# Patient Record
Sex: Female | Born: 1945 | Race: White | Hispanic: No | Smoking: Never smoker
Health system: Southern US, Community
[De-identification: ages and names within clinical notes are randomized; demographics above are authoritative.]

## PROBLEM LIST (undated history)

## (undated) DIAGNOSIS — I2510 Atherosclerotic heart disease of native coronary artery without angina pectoris: Secondary | ICD-10-CM

## (undated) DIAGNOSIS — Z923 Personal history of irradiation: Secondary | ICD-10-CM

## (undated) DIAGNOSIS — IMO0001 Reserved for inherently not codable concepts without codable children: Secondary | ICD-10-CM

## (undated) DIAGNOSIS — D649 Anemia, unspecified: Secondary | ICD-10-CM

## (undated) DIAGNOSIS — Z8719 Personal history of other diseases of the digestive system: Secondary | ICD-10-CM

## (undated) DIAGNOSIS — K589 Irritable bowel syndrome without diarrhea: Secondary | ICD-10-CM

## (undated) DIAGNOSIS — R011 Cardiac murmur, unspecified: Secondary | ICD-10-CM

## (undated) DIAGNOSIS — C4490 Unspecified malignant neoplasm of skin, unspecified: Secondary | ICD-10-CM

## (undated) DIAGNOSIS — I499 Cardiac arrhythmia, unspecified: Secondary | ICD-10-CM

## (undated) DIAGNOSIS — R002 Palpitations: Secondary | ICD-10-CM

## (undated) DIAGNOSIS — K449 Diaphragmatic hernia without obstruction or gangrene: Secondary | ICD-10-CM

## (undated) DIAGNOSIS — M797 Fibromyalgia: Secondary | ICD-10-CM

## (undated) DIAGNOSIS — M1990 Unspecified osteoarthritis, unspecified site: Secondary | ICD-10-CM

## (undated) DIAGNOSIS — C801 Malignant (primary) neoplasm, unspecified: Secondary | ICD-10-CM

## (undated) DIAGNOSIS — I10 Essential (primary) hypertension: Secondary | ICD-10-CM

## (undated) DIAGNOSIS — Z9889 Other specified postprocedural states: Secondary | ICD-10-CM

## (undated) DIAGNOSIS — T8859XA Other complications of anesthesia, initial encounter: Secondary | ICD-10-CM

## (undated) DIAGNOSIS — R112 Nausea with vomiting, unspecified: Secondary | ICD-10-CM

## (undated) DIAGNOSIS — E785 Hyperlipidemia, unspecified: Secondary | ICD-10-CM

## (undated) DIAGNOSIS — R0602 Shortness of breath: Secondary | ICD-10-CM

## (undated) DIAGNOSIS — H9190 Unspecified hearing loss, unspecified ear: Secondary | ICD-10-CM

## (undated) DIAGNOSIS — N809 Endometriosis, unspecified: Secondary | ICD-10-CM

## (undated) DIAGNOSIS — I341 Nonrheumatic mitral (valve) prolapse: Secondary | ICD-10-CM

## (undated) DIAGNOSIS — S82892A Other fracture of left lower leg, initial encounter for closed fracture: Secondary | ICD-10-CM

## (undated) DIAGNOSIS — K219 Gastro-esophageal reflux disease without esophagitis: Secondary | ICD-10-CM

## (undated) HISTORY — PX: BREAST LUMPECTOMY: SHX2

## (undated) HISTORY — PX: TOTAL HIP ARTHROPLASTY: SHX124

## (undated) HISTORY — PX: BLEPHAROPLASTY: SUR158

## (undated) HISTORY — PX: APPENDECTOMY: SHX54

## (undated) HISTORY — PX: CARDIAC CATHETERIZATION: SHX172

## (undated) HISTORY — PX: SHOULDER ARTHROSCOPY DISTAL CLAVICLE EXCISION AND OPEN ROTATOR CUFF REPAIR: SHX2396

## (undated) HISTORY — PX: COLONOSCOPY W/ POLYPECTOMY: SHX1380

## (undated) HISTORY — PX: CHOLECYSTECTOMY: SHX55

## (undated) HISTORY — PX: CATARACT EXTRACTION, BILATERAL: SHX1313

## (undated) HISTORY — PX: SEPTOPLASTY: SUR1290

## (undated) HISTORY — PX: ABDOMINAL HYSTERECTOMY: SHX81

## (undated) HISTORY — PX: BACK SURGERY: SHX140

## (undated) HISTORY — PX: FOOT SURGERY: SHX648

## (undated) HISTORY — PX: LAPAROSCOPY: SHX197

## (undated) HISTORY — PX: KYPHOPLASTY: SHX5884

## (undated) HISTORY — PX: COLONOSCOPY WITH ESOPHAGOGASTRODUODENOSCOPY (EGD): SHX5779

---

## 2013-01-03 HISTORY — PX: LEFT HEART CATH AND CORONARY ANGIOGRAPHY: CATH118249

## 2013-12-05 DIAGNOSIS — H0019 Chalazion unspecified eye, unspecified eyelid: Secondary | ICD-10-CM | POA: Insufficient documentation

## 2015-01-18 DIAGNOSIS — I2510 Atherosclerotic heart disease of native coronary artery without angina pectoris: Secondary | ICD-10-CM | POA: Insufficient documentation

## 2015-01-18 DIAGNOSIS — I341 Nonrheumatic mitral (valve) prolapse: Secondary | ICD-10-CM | POA: Insufficient documentation

## 2015-01-18 DIAGNOSIS — E785 Hyperlipidemia, unspecified: Secondary | ICD-10-CM | POA: Insufficient documentation

## 2015-01-18 DIAGNOSIS — I10 Essential (primary) hypertension: Secondary | ICD-10-CM | POA: Insufficient documentation

## 2017-01-11 DIAGNOSIS — E559 Vitamin D deficiency, unspecified: Secondary | ICD-10-CM | POA: Insufficient documentation

## 2017-01-11 DIAGNOSIS — M797 Fibromyalgia: Secondary | ICD-10-CM | POA: Insufficient documentation

## 2017-04-14 DIAGNOSIS — G4719 Other hypersomnia: Secondary | ICD-10-CM | POA: Insufficient documentation

## 2017-11-23 DIAGNOSIS — M6751 Plica syndrome, right knee: Secondary | ICD-10-CM | POA: Insufficient documentation

## 2018-01-04 DIAGNOSIS — Z96643 Presence of artificial hip joint, bilateral: Secondary | ICD-10-CM | POA: Insufficient documentation

## 2018-01-19 DIAGNOSIS — Z79899 Other long term (current) drug therapy: Secondary | ICD-10-CM | POA: Insufficient documentation

## 2018-01-19 DIAGNOSIS — F419 Anxiety disorder, unspecified: Secondary | ICD-10-CM | POA: Insufficient documentation

## 2018-08-09 DIAGNOSIS — M5136 Other intervertebral disc degeneration, lumbar region: Secondary | ICD-10-CM | POA: Insufficient documentation

## 2019-02-27 DIAGNOSIS — G4700 Insomnia, unspecified: Secondary | ICD-10-CM | POA: Insufficient documentation

## 2019-09-19 DIAGNOSIS — M25561 Pain in right knee: Secondary | ICD-10-CM | POA: Insufficient documentation

## 2020-03-04 DIAGNOSIS — S83231A Complex tear of medial meniscus, current injury, right knee, initial encounter: Secondary | ICD-10-CM | POA: Insufficient documentation

## 2020-03-13 DIAGNOSIS — M4802 Spinal stenosis, cervical region: Secondary | ICD-10-CM | POA: Insufficient documentation

## 2020-03-13 DIAGNOSIS — M5412 Radiculopathy, cervical region: Secondary | ICD-10-CM | POA: Insufficient documentation

## 2020-04-24 ENCOUNTER — Other Ambulatory Visit: Payer: Self-pay | Admitting: Physical Medicine & Rehabilitation

## 2020-04-24 DIAGNOSIS — M5412 Radiculopathy, cervical region: Secondary | ICD-10-CM

## 2020-04-24 DIAGNOSIS — M542 Cervicalgia: Secondary | ICD-10-CM

## 2020-05-01 ENCOUNTER — Ambulatory Visit
Admission: RE | Admit: 2020-05-01 | Discharge: 2020-05-01 | Disposition: A | Discharge status: Home / Self Care | Payer: Medicare Other | Source: Ambulatory Visit | Attending: Physical Medicine & Rehabilitation | Admitting: Physical Medicine & Rehabilitation

## 2020-05-01 DIAGNOSIS — M5412 Radiculopathy, cervical region: Secondary | ICD-10-CM

## 2020-05-01 DIAGNOSIS — M542 Cervicalgia: Secondary | ICD-10-CM

## 2020-05-01 IMAGING — XA DG INJECT/[PERSON_NAME] INC NEEDLE/CATH/PLC EPI/CERV/THOR W/IMG
2 series · 2 of 2 positions shown · non-contrast
Comparison: none

CLINICAL DATA: Cervical spondylosis without myelopathy. Pain across
the neck extending into the left shoulder with headaches. Prior
C3-C6 fusion.

[Series 1: ortho adipose · 1 of 1 slices shown (1 of 2)]
[im 1/1]
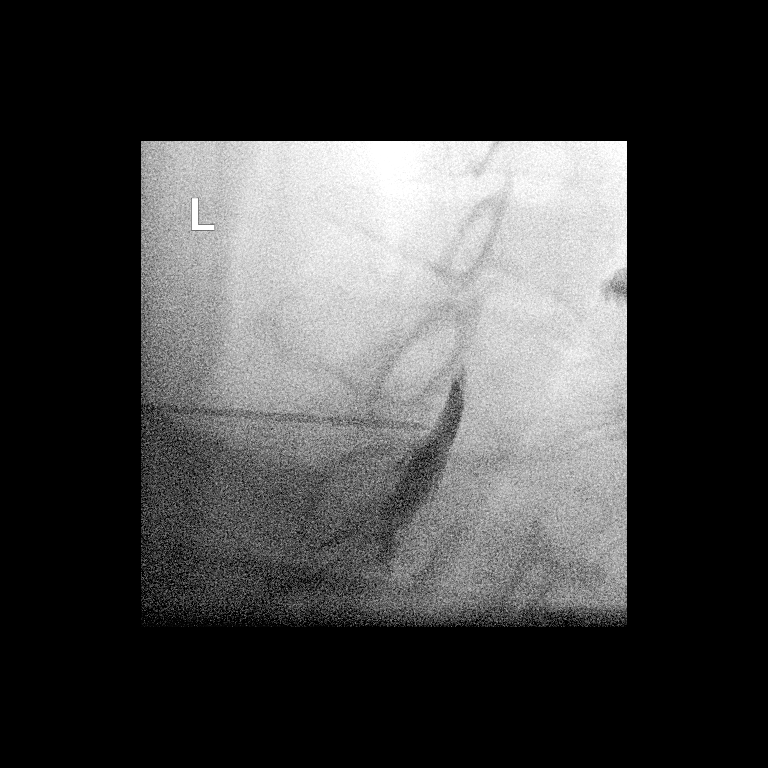

[Series 2: ortho adipose · 1 of 1 slices shown (2 of 2)]
[im 1/1]
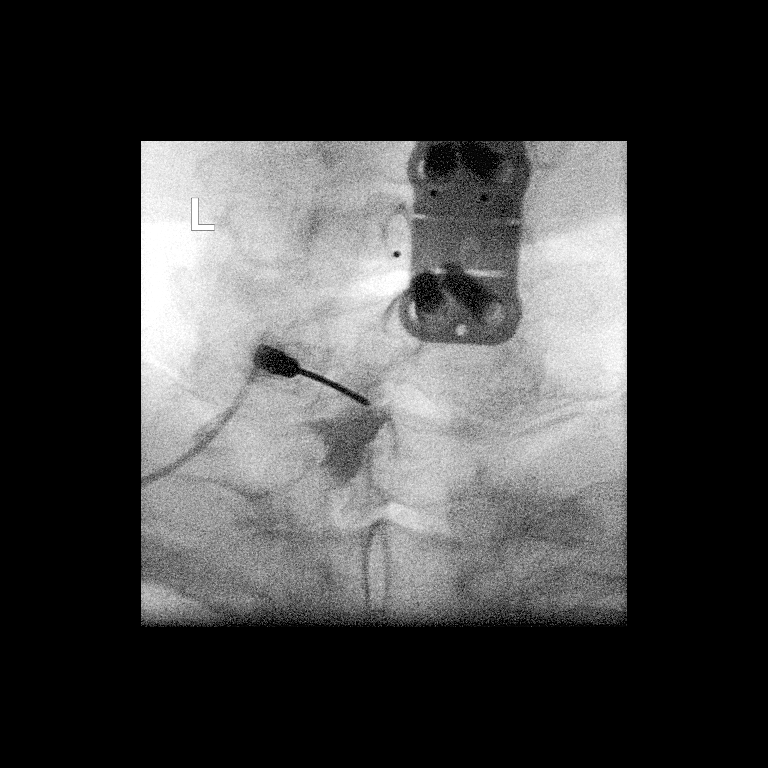

[2 of 2 positions shown; findings below may reference images not displayed]

FLUOROSCOPY TIME:  Fluoroscopy Time: 21 seconds

Radiation Exposure Index: 14.58 microGray*m^2

PROCEDURE:
The procedure, risks, benefits, and alternatives were explained to
the patient. Questions regarding the procedure were encouraged and
answered. The patient understands and consents to the procedure.

CERVICAL EPIDURAL INJECTION

An interlaminar approach was performed on the left at C7-T1. A
inch 20 gauge epidural needle was advanced using loss-of-resistance
technique.

DIAGNOSTIC EPIDURAL INJECTION

Injection of Isovue-M 300 shows a good epidural pattern with spread
above and below the level of needle placement, primarily on the
left. No vascular opacification is seen.

THERAPEUTIC EPIDURAL INJECTION

1.5 ml of Kenalog 40 mixed with 2 ml of normal saline were then
instilled. The procedure was well-tolerated, and the patient was
discharged thirty minutes following the injection in good condition.
IMPRESSION: Technically successful interlaminar epidural injection on the left
at C7-T1.

## 2020-05-01 MED ORDER — TRIAMCINOLONE ACETONIDE 40 MG/ML IJ SUSP (RADIOLOGY)
60.0000 mg | Freq: Once | INTRAMUSCULAR | Status: AC
  Administered 2020-05-01: 60 mg via EPIDURAL

## 2020-05-01 MED ORDER — IOPAMIDOL (ISOVUE-M 300) INJECTION 61%
1.0000 mL | Freq: Once | INTRAMUSCULAR | Status: AC | PRN
  Administered 2020-05-01: 1 mL via EPIDURAL

## 2020-05-01 NOTE — Discharge Instructions (Signed)

## 2020-07-30 ENCOUNTER — Other Ambulatory Visit: Payer: Self-pay | Admitting: Internal Medicine

## 2020-07-30 DIAGNOSIS — Z1231 Encounter for screening mammogram for malignant neoplasm of breast: Secondary | ICD-10-CM

## 2020-08-21 DIAGNOSIS — I272 Pulmonary hypertension, unspecified: Secondary | ICD-10-CM

## 2020-08-21 HISTORY — DX: Pulmonary hypertension, unspecified: I27.20

## 2020-11-05 ENCOUNTER — Emergency Department: Payer: Medicare Other

## 2020-11-05 ENCOUNTER — Other Ambulatory Visit: Payer: Self-pay

## 2020-11-05 ENCOUNTER — Emergency Department
Admission: EM | Admit: 2020-11-05 | Discharge: 2020-11-05 | Disposition: A | Discharge status: Home / Self Care | Payer: Medicare Other | Attending: Emergency Medicine | Admitting: Emergency Medicine

## 2020-11-05 DIAGNOSIS — S82832A Other fracture of upper and lower end of left fibula, initial encounter for closed fracture: Secondary | ICD-10-CM | POA: Insufficient documentation

## 2020-11-05 DIAGNOSIS — I10 Essential (primary) hypertension: Secondary | ICD-10-CM | POA: Diagnosis not present

## 2020-11-05 DIAGNOSIS — I2510 Atherosclerotic heart disease of native coronary artery without angina pectoris: Secondary | ICD-10-CM | POA: Diagnosis not present

## 2020-11-05 DIAGNOSIS — W010XXA Fall on same level from slipping, tripping and stumbling without subsequent striking against object, initial encounter: Secondary | ICD-10-CM | POA: Diagnosis not present

## 2020-11-05 DIAGNOSIS — S82302A Unspecified fracture of lower end of left tibia, initial encounter for closed fracture: Secondary | ICD-10-CM | POA: Diagnosis not present

## 2020-11-05 DIAGNOSIS — M25572 Pain in left ankle and joints of left foot: Secondary | ICD-10-CM | POA: Diagnosis present

## 2020-11-05 DIAGNOSIS — S82392A Other fracture of lower end of left tibia, initial encounter for closed fracture: Secondary | ICD-10-CM

## 2020-11-05 DIAGNOSIS — S8992XA Unspecified injury of left lower leg, initial encounter: Secondary | ICD-10-CM | POA: Diagnosis present

## 2020-11-05 IMAGING — DX DG TIBIA/FIBULA 2V*L*
4 series · 4 of 4 positions shown · non-contrast
Comparison: None.

CLINICAL DATA: Lower leg deformity

EXAM:
LEFT TIBIA AND FIBULA - 2 VIEW

[tibia ap (1 of 2)]
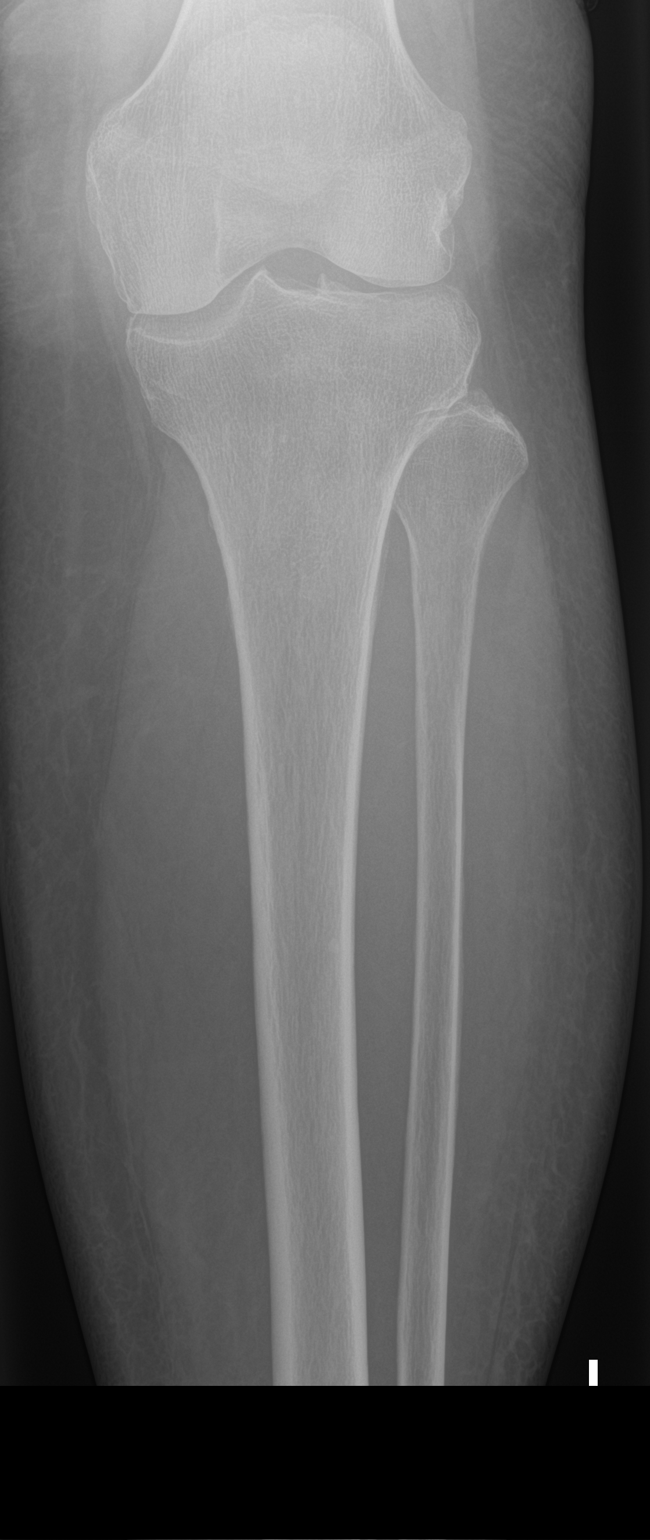

[tibia ap (2 of 2)]
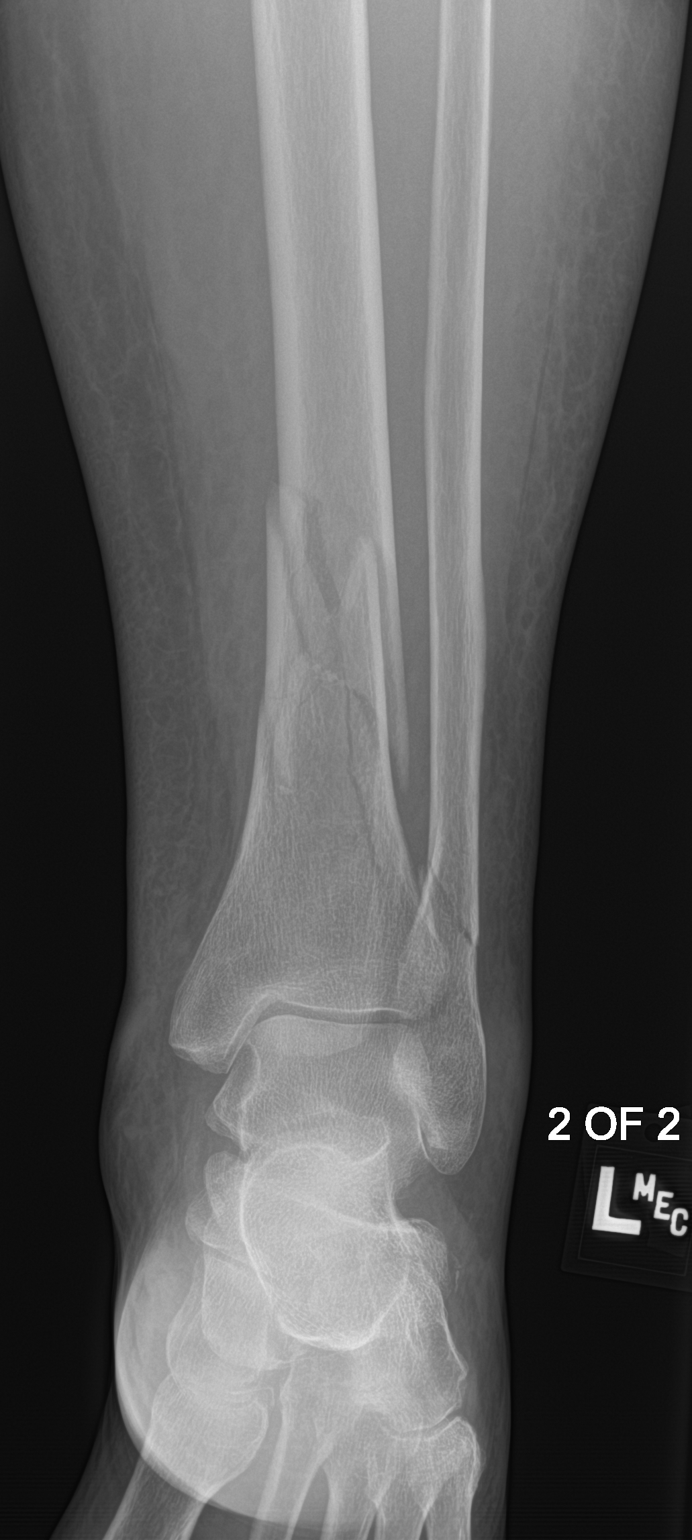

[tibia lat (1 of 2)]
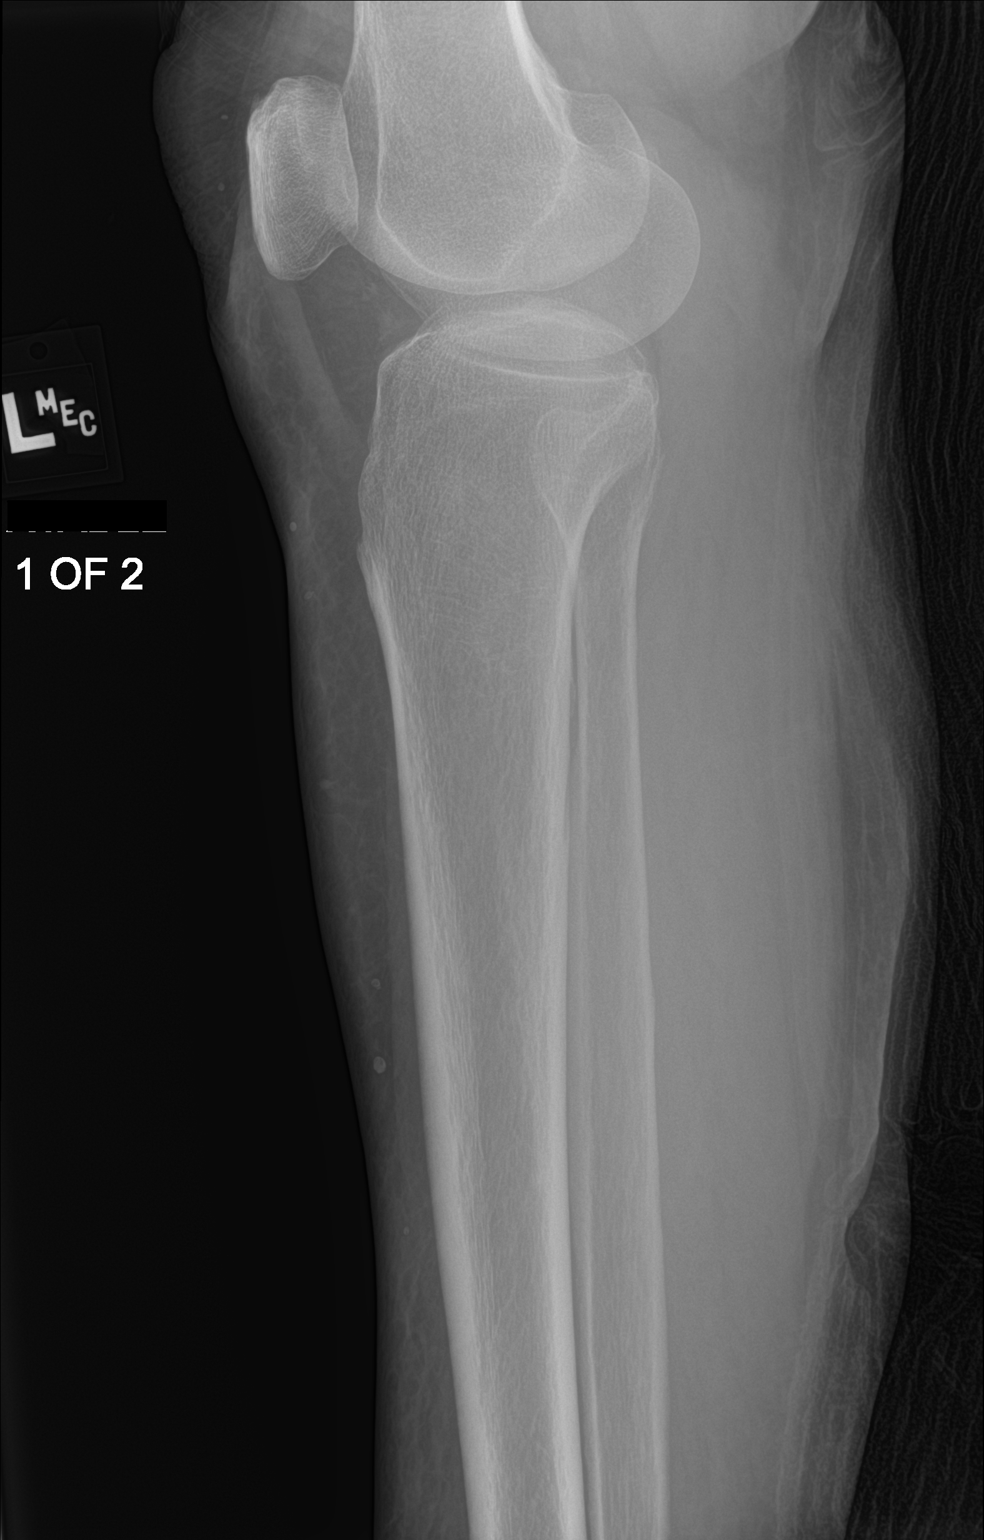

[tibia lat (2 of 2)]
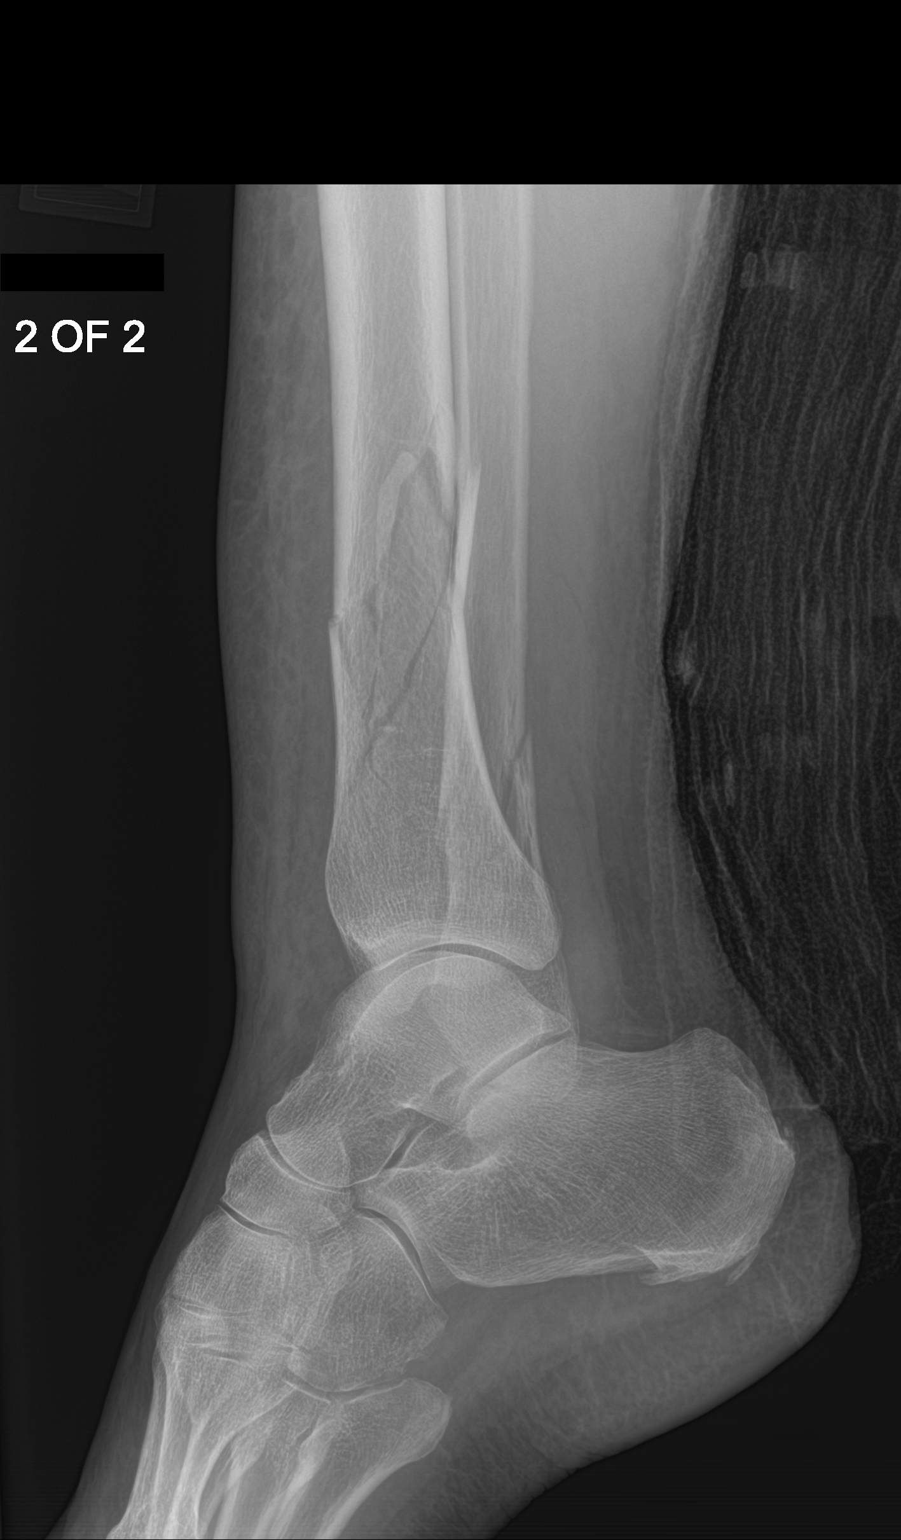

[4 of 4 positions shown; findings below may reference images not displayed]

FINDINGS: There are comminuted, oblique, minimally displaced fractures of the
distal left tibial metadiaphysis and a minimally displaced oblique
fracture of the distal left fibular metadiaphysis. Diffuse soft
tissue edema about the lower leg.
IMPRESSION: There are comminuted, oblique, minimally displaced fractures of the
distal left tibial metadiaphysis and a minimally displaced oblique
fracture of the distal left fibular metadiaphysis.

## 2020-11-05 MED ORDER — OXYCODONE-ACETAMINOPHEN 5-325 MG PO TABS: 1.0000 | ORAL_TABLET | ORAL | 0 refills | Status: AC | PRN

## 2020-11-05 NOTE — ED Notes (Signed)
Left shoe removed. Swelling and deformation noted. Pedal pulses palpated.

## 2020-11-05 NOTE — ED Notes (Signed)
X-ray at bedside

## 2020-11-05 NOTE — Discharge Instructions (Signed)
Keep the splint on at all times and do not bear any weight on the left leg.  Keep the leg elevated to decrease swelling.  You may take the Percocet as needed for pain.  Make an appointment to follow-up with Dr. Marry Guan later this week.  Return to the ER for new, worsening, or persistent severe pain, weakness or numbness, or any other new or worsening symptoms that concern you.

## 2020-11-05 NOTE — ED Provider Notes (Signed)
[facility] Emergency Department Provider Note ____________________________________________   Event Date/Time   First MD Initiated Contact with Patient 11/05/20 1324     (approximate)  I have reviewed the triage vital signs and the nursing notes.   HISTORY  Chief Complaint Ankle Pain    HPI Suzanne Escobar is a 75 y.o. female with PMH as noted below who presents with a left lower leg injury, acute onset when she turned and lost her balance on a slippery floor.  The patient has not been able to bear weight on the left leg.  She denies any other injuries and states that her pain is controlled by the fentanyl given by EMS.  History reviewed. No pertinent past medical history.  Patient Active Problem List   Diagnosis Date Noted  . Cervical radiculitis 03/13/2020  . 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 Complex tear of medial meniscus of right knee as current injury 03/04/2020  . Chronic pain of both knees 09/19/2019  . Insomnia 02/27/2019  . Lumbar degenerative disc disease 08/09/2018  . Anxiety 01/19/2018  . Controlled substance agreement signed 01/19/2018  . H/O bilateral hip replacements 01/04/2018  . 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 Excessive daytime sleepiness 04/14/2017  . Fibromyalgia 01/11/2017  . Vitamin D deficiency 01/11/2017  . Chronic coronary artery disease 01/18/2015  . Hyperlipidemia 01/18/2015  . Hypertension 01/18/2015  . Mitral valve prolapse 01/18/2015  . Chalazion 12/05/2013  . 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 No pertinent surgical history.  Prior to Admission medications   Medication Sig Start Date End Date Taking? Authorizing Provider  oxyCODONE-acetaminophen (PERCOCET) 5-325 MG tablet Take 1 tablet by mouth every 4 (four) hours as needed for up to 7 days for severe pain. 11/05/20 11/12/20 Yes Arta Silence, MD    Allergies Hydrocodone-acetaminophen  History reviewed.  No pertinent family history.  Social History    Review of Systems  Constitutional: No fever. Eyes: No redness. ENT: No neck pain. Cardiovascular: Denies chest pain. Respiratory: Denies shortness of breath. Gastrointestinal: No vomiting. Genitourinary: Negative for flank pain. Musculoskeletal: Positive for left lower leg pain. Skin: Negative for rash. Neurological: Negative for focal weakness or numbness.   ____________________________________________   PHYSICAL EXAM:  VITAL SIGNS: ED Triage Vitals [11/05/20 1327]  Enc Vitals Group     BP (!) 150/87     Pulse Rate 82     Resp 18     Temp 98 F (36.7 C)     Temp Source Oral     SpO2 99 %     Weight      Height      Head Circumference      Peak Flow      Pain Score 0     Pain Loc      Pain Edu?      Excl. in [place]?     Constitutional: Alert and oriented. Well appearing and in no acute distress. Eyes: Conjunctivae are normal.  Head: Atraumatic. Nose: No congestion/rhinnorhea. Mouth/Throat: Mucous membranes are moist.   Neck: Normal range of motion.  Cardiovascular: Normal rate, regular rhythm. Good peripheral circulation. Respiratory: Normal respiratory effort.  No retractions.  Gastrointestinal: No distention.  Musculoskeletal: No lower extremity edema.  Extremities warm and well perfused.  Deformity and swelling to distal left leg just above the ankle.  2+ DP pulse.  Cap refill less than 2 seconds. Neurologic:  Normal speech and language.  Motor and sensory intact to left  lower extremity. Skin:  Skin is warm and dry. No rash noted. Psychiatric: Mood and affect are normal. Speech and behavior are normal.  ____________________________________________   LABS (all labs ordered are listed, but only abnormal results are displayed)  Labs Reviewed - No data to display ____________________________________________  EKG   ____________________________________________  RADIOLOGY  XR left tibia/fibula interpreted  by me shows comminuted, minimally displaced fractures of the distal left tibia and fibula   ____________________________________________   PROCEDURES  Procedure(s) performed: No  Procedures  Critical Care performed: No ____________________________________________   INITIAL IMPRESSION / ASSESSMENT AND PLAN / ED COURSE  Pertinent labs & imaging results that were available during my care of the patient were reviewed by me and considered in my medical decision making (see chart for details).  75 year old female with PMH as noted above presents with a left lower leg injury after a mechanical fall.  She denies any other injuries.  On exam there is somewhat of a deformity to the left lower leg just proximal to the ankle.  The left lower extremities neuro/vascular intact.  Overall presentation is concerning for acute fracture of the distal tibia/fibula.  We will obtain x-rays and discuss with orthopedics.  ----------------------------------------- 2:52 PM on 11/05/2020 -----------------------------------------  X-rays confirm minimally displaced fractures of the distal left tibia and fibula.  I consulted Dr. Marry Guan from orthopedics who reviewed the images.  He recommends nonweightbearing, placing a posterior and stirrup splint and follow-up with him later this week.  I counseled the patient on the results of the imaging.  The splint has been placed.  The fracture does not require any reduction.  The patient is stable for discharge home at this time.  Return precautions given, and I emphasized the importance of avoiding any weightbearing on the leg; she expresses understanding.  ____________________________________________   FINAL CLINICAL IMPRESSION(S) / ED DIAGNOSES  Final diagnoses:  Other closed fracture of distal end of left tibia, initial encounter      NEW MEDICATIONS STARTED DURING THIS VISIT:  New Prescriptions   OXYCODONE-ACETAMINOPHEN (PERCOCET) 5-325 MG TABLET     Take 1 tablet by mouth every 4 (four) hours as needed for up to 7 days for severe pain.     Note:  This document was prepared using Dragon voice recognition software and may include unintentional dictation errors.    Arta Silence, MD 11/05/20 1453

## 2020-11-05 NOTE — ED Triage Notes (Signed)
Arrived via EMS, patient fell at spa, obvious swelling and deformity to left ankle. Denies other injury.

## 2020-11-08 ENCOUNTER — Other Ambulatory Visit: Admission: RE | Admit: 2020-11-08 | Payer: Medicare Other | Source: Ambulatory Visit

## 2020-11-11 ENCOUNTER — Emergency Department: Payer: Medicare Other

## 2020-11-11 ENCOUNTER — Other Ambulatory Visit: Payer: Self-pay

## 2020-11-11 ENCOUNTER — Emergency Department
Admission: EM | Admit: 2020-11-11 | Discharge: 2020-11-11 | Disposition: A | Discharge status: Home / Self Care | Payer: Medicare Other | Attending: Emergency Medicine | Admitting: Emergency Medicine

## 2020-11-11 DIAGNOSIS — Y9301 Activity, walking, marching and hiking: Secondary | ICD-10-CM | POA: Insufficient documentation

## 2020-11-11 DIAGNOSIS — S52612A Displaced fracture of left ulna styloid process, initial encounter for closed fracture: Secondary | ICD-10-CM | POA: Insufficient documentation

## 2020-11-11 DIAGNOSIS — S62102A Fracture of unspecified carpal bone, left wrist, initial encounter for closed fracture: Secondary | ICD-10-CM

## 2020-11-11 DIAGNOSIS — W19XXXA Unspecified fall, initial encounter: Secondary | ICD-10-CM

## 2020-11-11 DIAGNOSIS — I10 Essential (primary) hypertension: Secondary | ICD-10-CM | POA: Insufficient documentation

## 2020-11-11 DIAGNOSIS — Z79899 Other long term (current) drug therapy: Secondary | ICD-10-CM | POA: Insufficient documentation

## 2020-11-11 DIAGNOSIS — S52502A Unspecified fracture of the lower end of left radius, initial encounter for closed fracture: Secondary | ICD-10-CM | POA: Diagnosis not present

## 2020-11-11 DIAGNOSIS — W010XXA Fall on same level from slipping, tripping and stumbling without subsequent striking against object, initial encounter: Secondary | ICD-10-CM | POA: Insufficient documentation

## 2020-11-11 DIAGNOSIS — I2510 Atherosclerotic heart disease of native coronary artery without angina pectoris: Secondary | ICD-10-CM | POA: Diagnosis not present

## 2020-11-11 DIAGNOSIS — S6992XA Unspecified injury of left wrist, hand and finger(s), initial encounter: Secondary | ICD-10-CM | POA: Diagnosis present

## 2020-11-11 IMAGING — CR DG WRIST COMPLETE 3+V*L*
1 series · 4 of 4 positions shown · non-contrast
Comparison: None.

CLINICAL DATA: Status post fall

EXAM:
LEFT FOREARM - 2 VIEW; LEFT WRIST - COMPLETE 3+ VIEW

[Series 1: dg wrist complete left · 0.14mm/px · 4 of 4 slices shown]
[im 1/4]
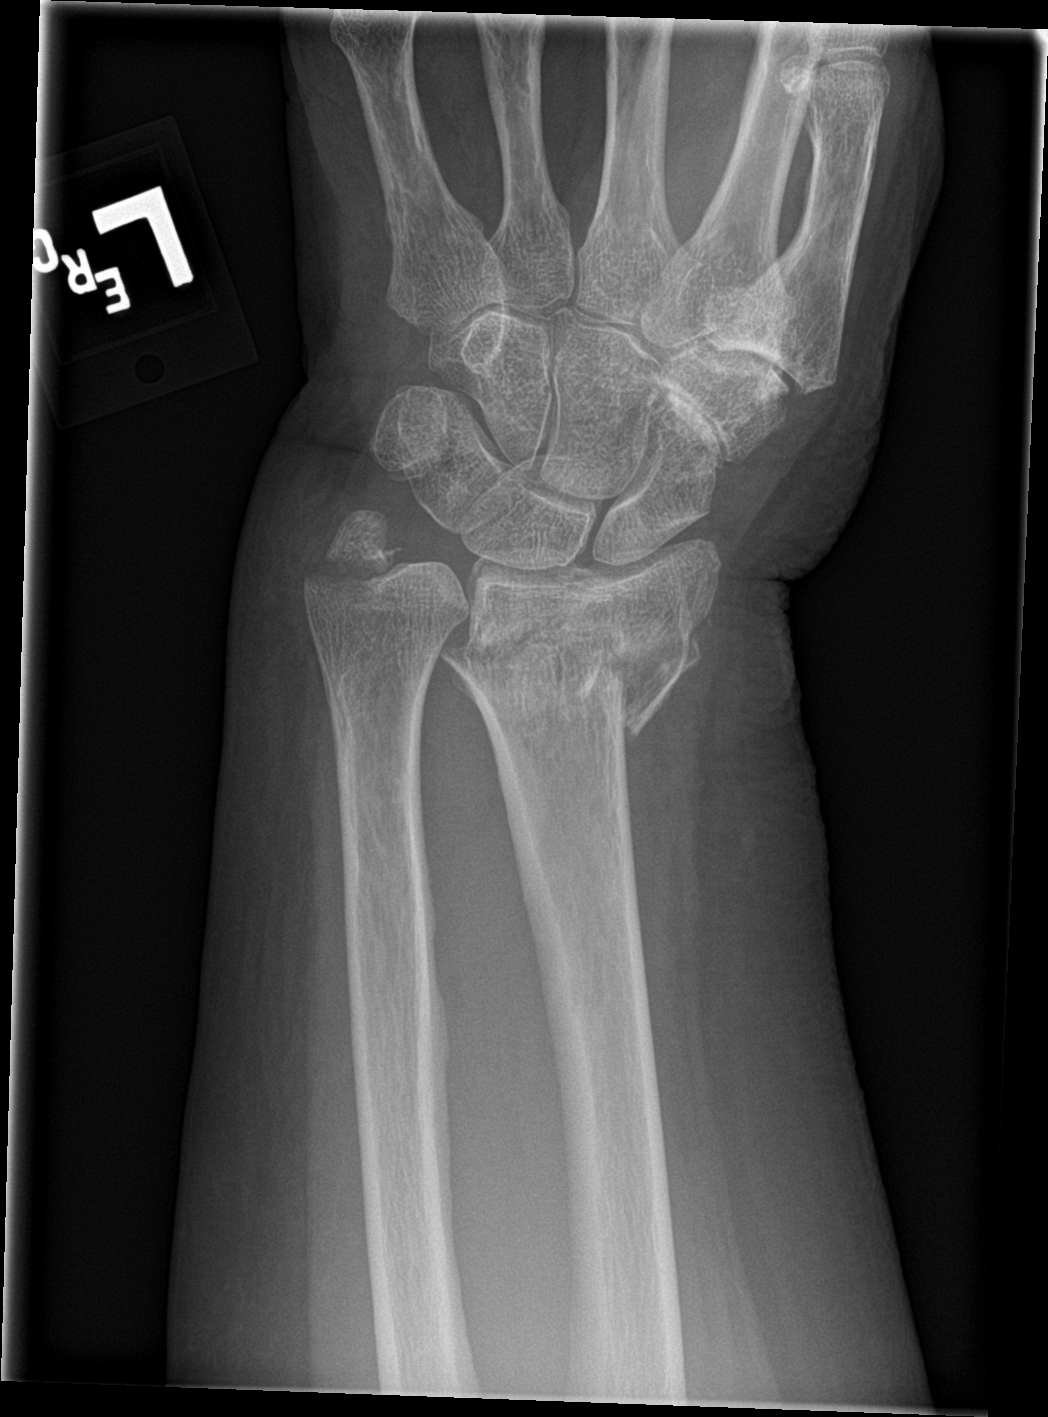
[im 2/4]
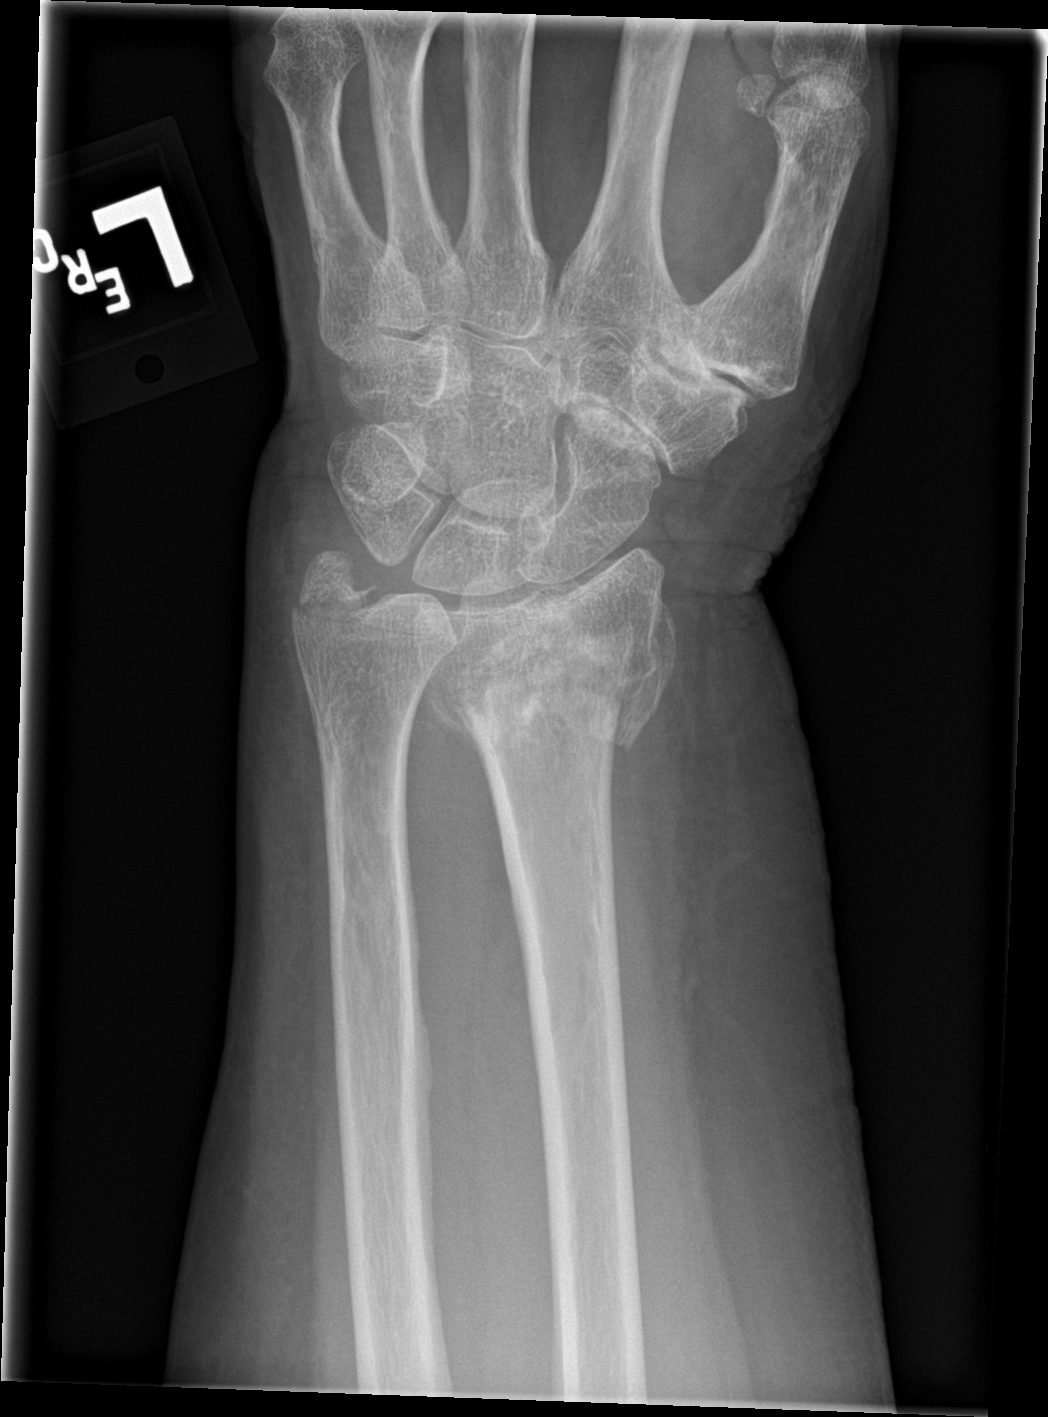
[im 3/4]
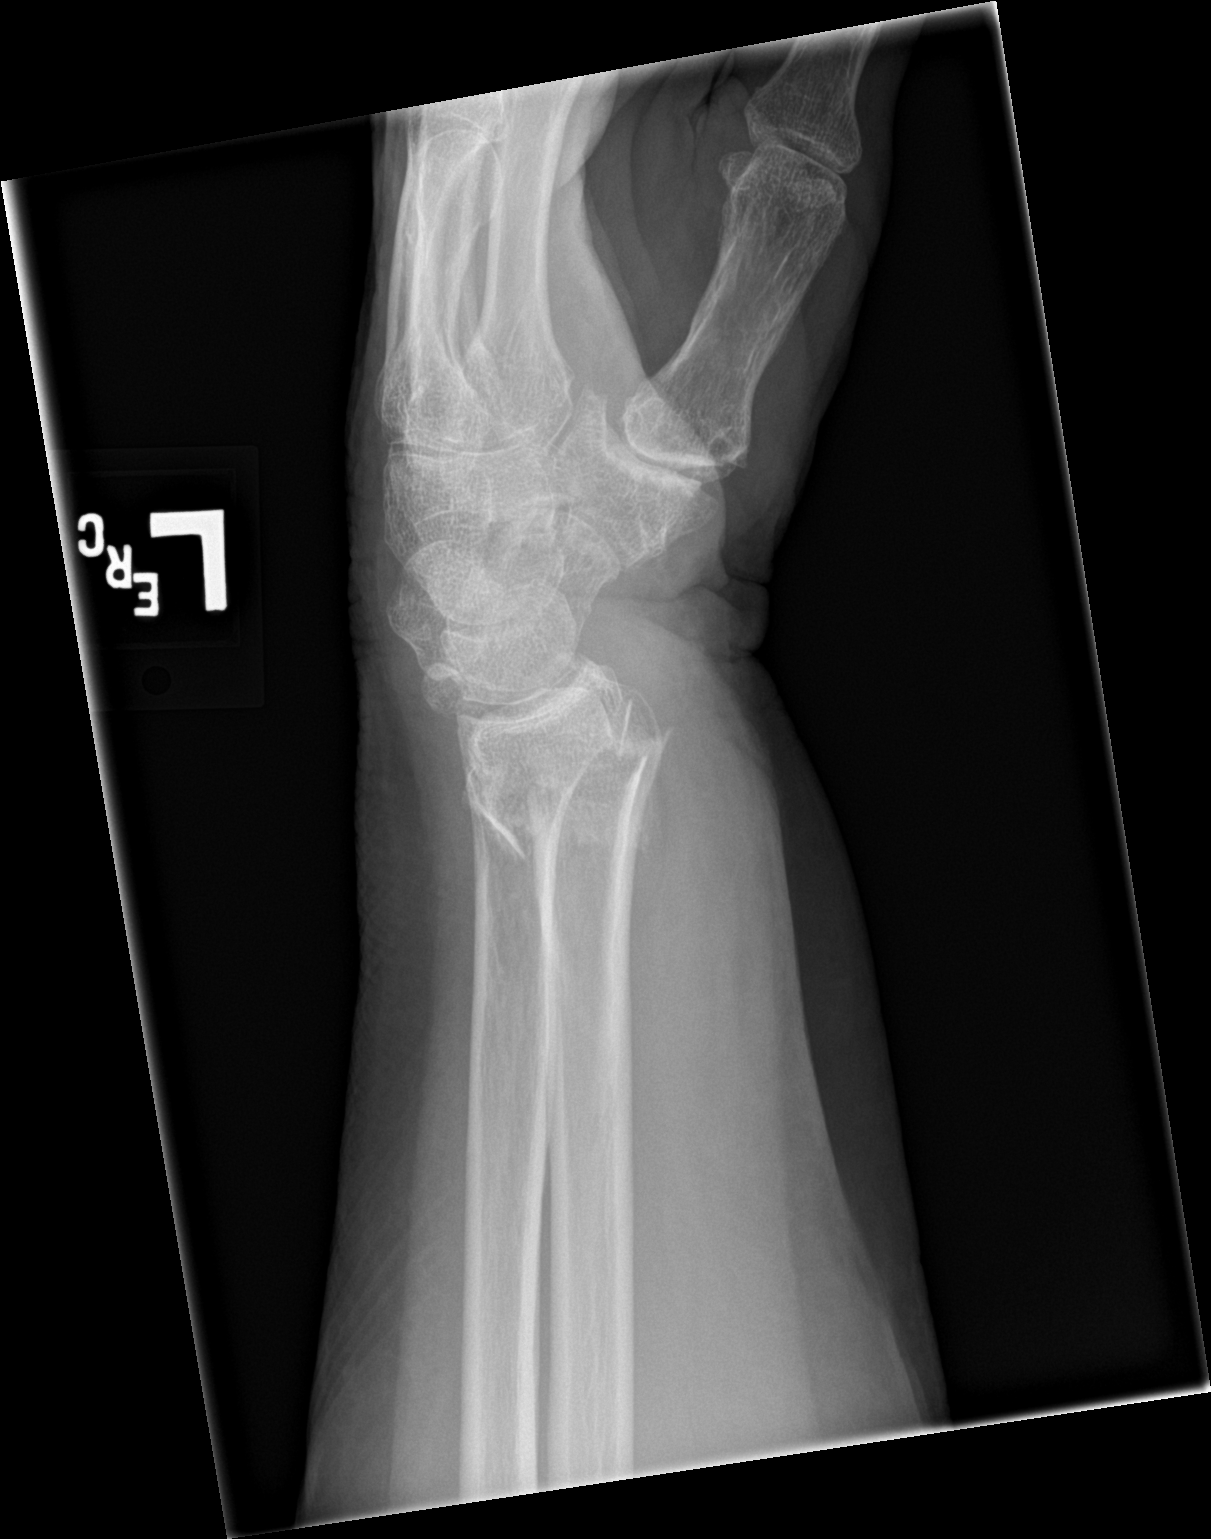
[im 4/4]
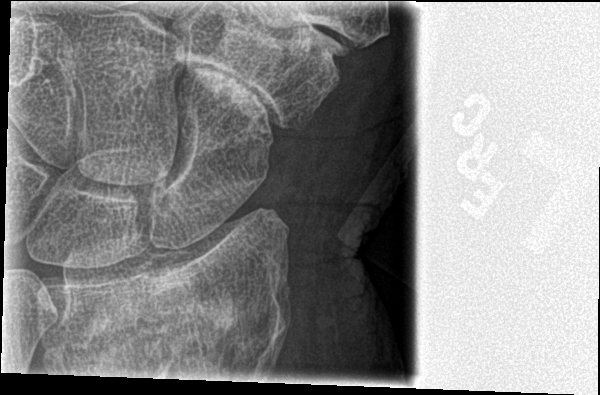

[4 of 4 positions shown; findings below may reference images not displayed]

FINDINGS: Intra-articular, dorsally angulated, dorsolaterally displaced,
comminuted, and impacted fracture of the distal radius. Minimally
displaced fracture of the ulnar styloid. Degenerative changes of the
first carpometacarpal joint.

No acute displaced fracture or dislocation of the proximal bones of
the forearm. Visualized portion the elbow are grossly unremarkable.
IMPRESSION: 1. Intra-articular, dorsally angulated, dorsolaterally displaced,
comminuted, and impacted fracture of the distal radius.
2. Minimally displaced fracture of the ulnar styloid.
3. No acute displaced fracture or dislocation of the proximal bones
of the forearm.

## 2020-11-11 IMAGING — CR DG FOREARM 2V*L*
1 series · 2 of 2 positions shown · non-contrast
Comparison: None.

CLINICAL DATA: Status post fall

EXAM:
LEFT FOREARM - 2 VIEW; LEFT WRIST - COMPLETE 3+ VIEW

[Series 1: dg forearm left · 0.14mm/px · 2 of 2 slices shown]
[im 1/2]
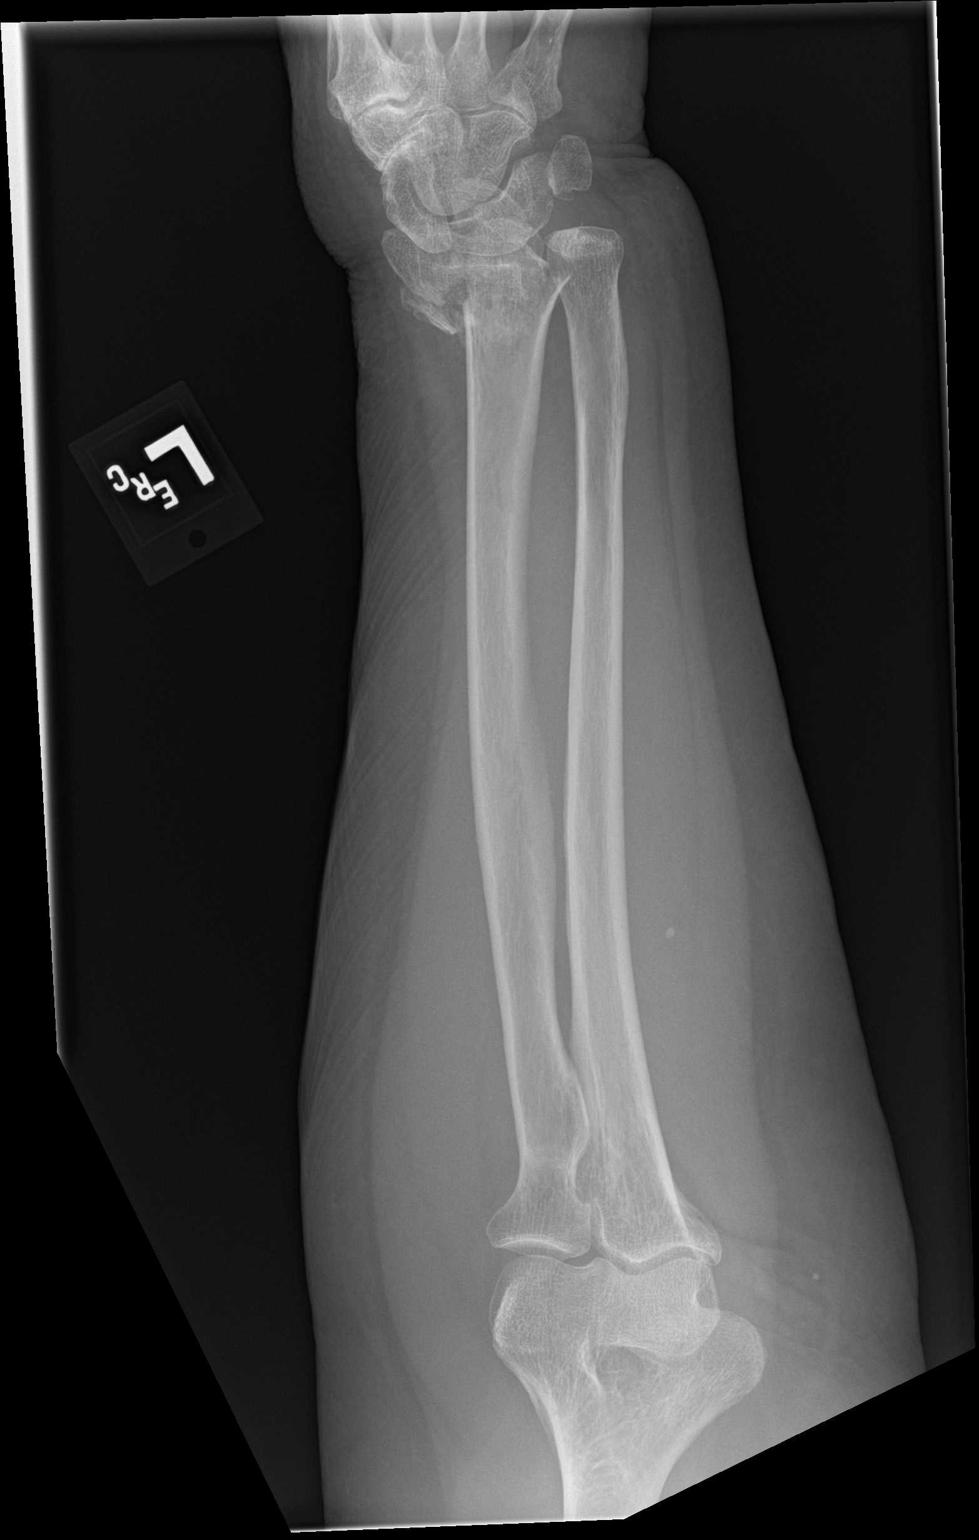
[im 2/2]
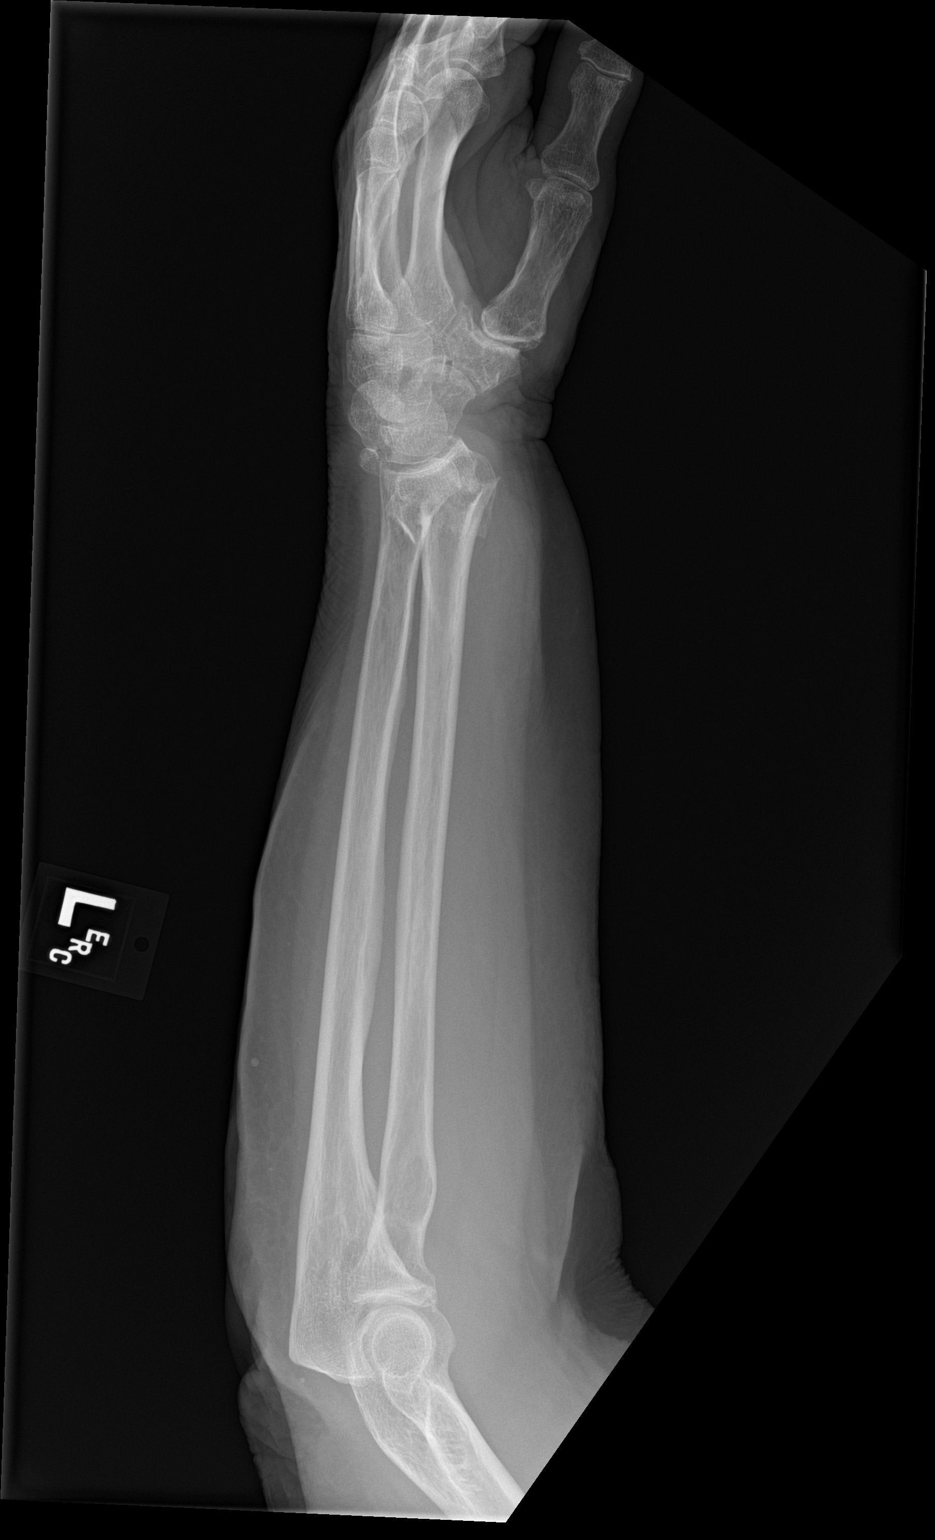

[2 of 2 positions shown; findings below may reference images not displayed]

FINDINGS: Intra-articular, dorsally angulated, dorsolaterally displaced,
comminuted, and impacted fracture of the distal radius. Minimally
displaced fracture of the ulnar styloid. Degenerative changes of the
first carpometacarpal joint.

No acute displaced fracture or dislocation of the proximal bones of
the forearm. Visualized portion the elbow are grossly unremarkable.
IMPRESSION: 1. Intra-articular, dorsally angulated, dorsolaterally displaced,
comminuted, and impacted fracture of the distal radius.
2. Minimally displaced fracture of the ulnar styloid.
3. No acute displaced fracture or dislocation of the proximal bones
of the forearm.

## 2020-11-11 IMAGING — DX DG WRIST 2V*L*
2 series · 2 of 2 positions shown · non-contrast
Comparison: Earlier today

CLINICAL DATA: Status post reduction.

EXAM:
LEFT WRIST - 2 VIEW

[wrist ap]
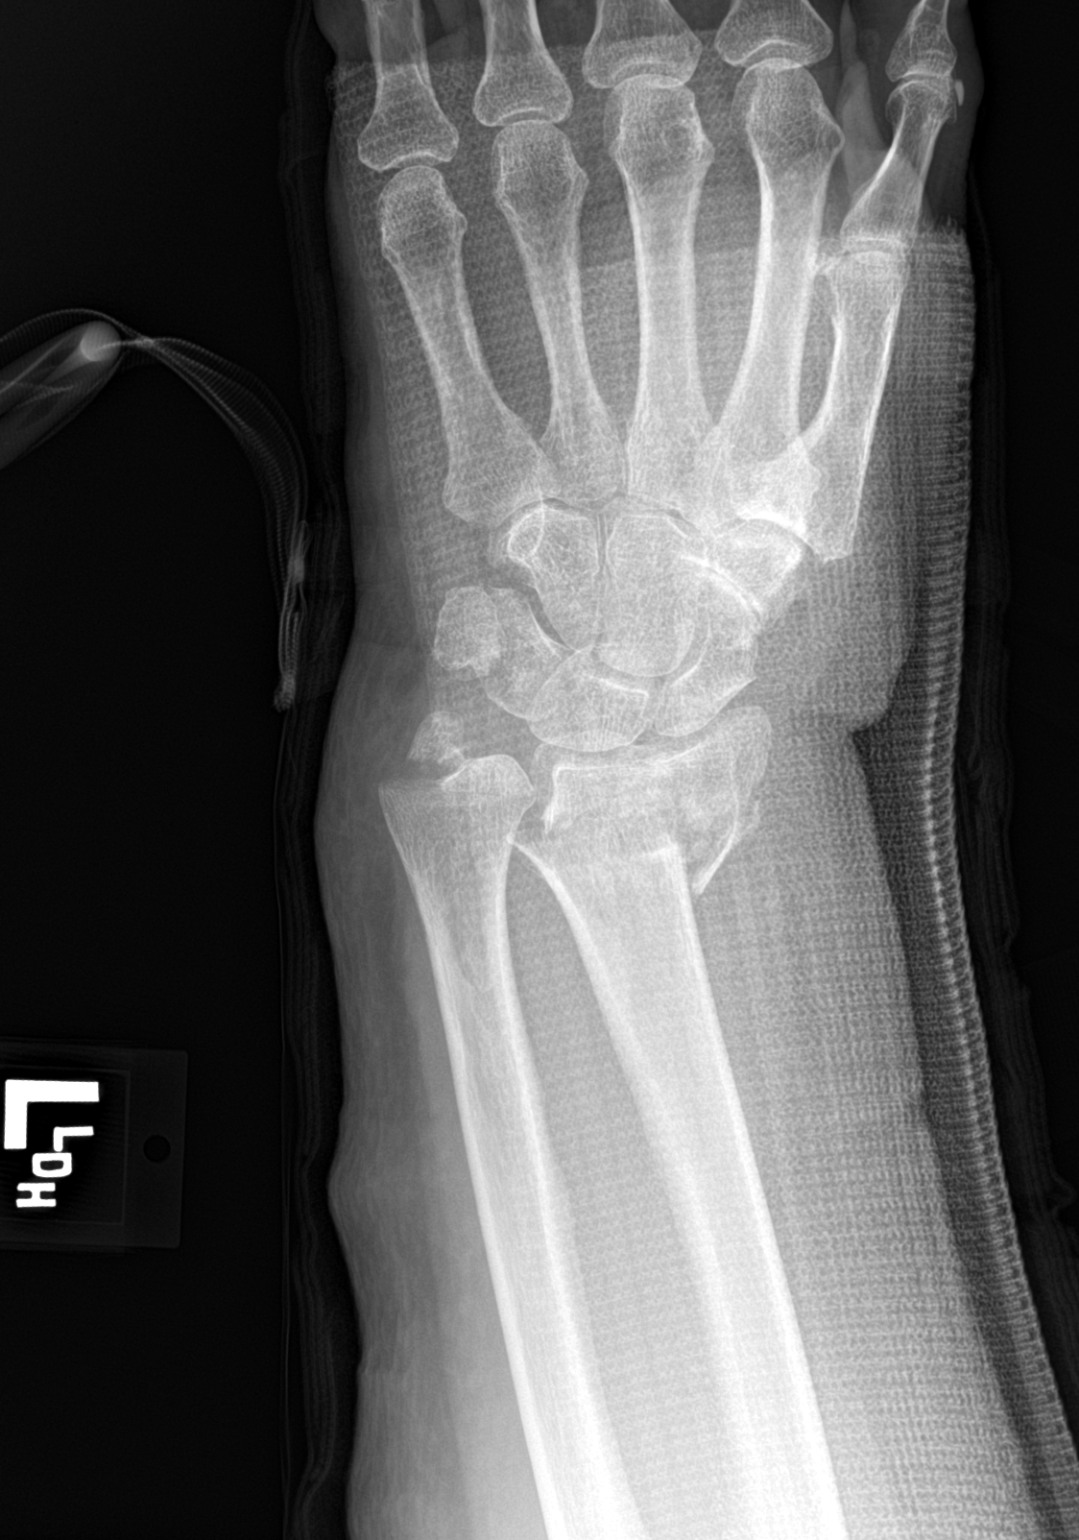

[wrist lat]
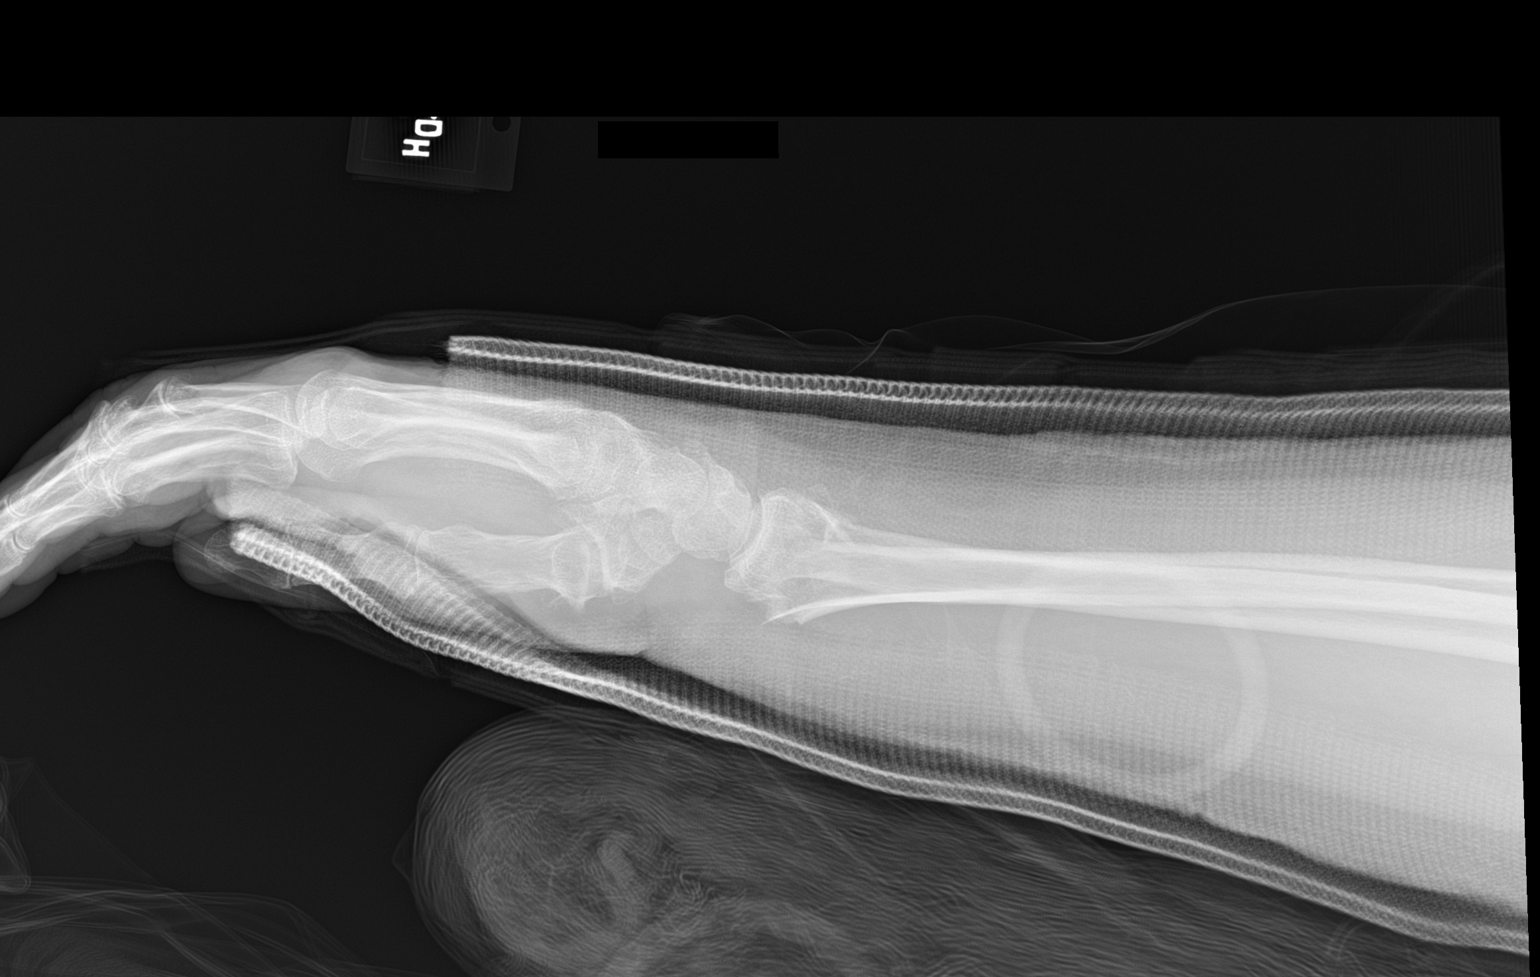

[2 of 2 positions shown; findings below may reference images not displayed]

FINDINGS: Distal radius and ulnar fractures with especially radial sided
impaction. Dorsal impaction that is improved. Similar degree of
distraction and radial sided displacement of the ulnar styloid. No
new abnormality. A splint has been applied.
IMPRESSION: 1. Distal radius fracture with improved dorsal impaction. Lateral
sided impaction appears similar.
2. Unchanged displacement of the ulnar styloid.

## 2020-11-11 MED ORDER — LIDOCAINE HCL (PF) 1 % IJ SOLN
30.0000 mL | Freq: Once | INTRAMUSCULAR | Status: AC
  Administered 2020-11-11: 30 mL
  Filled 2020-11-11: qty 30

## 2020-11-11 MED ORDER — OXYCODONE HCL 5 MG PO TABS
5.0000 mg | ORAL_TABLET | Freq: Once | ORAL | Status: AC
  Administered 2020-11-11: 5 mg via ORAL
  Filled 2020-11-11: qty 1

## 2020-11-11 NOTE — Discharge Instructions (Addendum)
Follow-up with the [facility] clinic Keep it iced and elevated  1. Distal radius fracture with improved dorsal impaction. Lateral sided impaction appears similar. 2. Unchanged displacement of the ulnar styloid.

## 2020-11-11 NOTE — ED Provider Notes (Signed)
[facility] Emergency Department Provider Note  ____________________________________________   Event Date/Time   First MD Initiated Contact with Patient 11/11/20 [phone]     (approximate)  I have reviewed the triage vital signs and the nursing notes.   HISTORY  Chief Complaint Wrist Pain    HPI Suzanne Escobar is a 75 y.o. female who comes in for fall.  Patient had tibia fracture and is nonweightbearing on her left foot.  It is well healing and they are not planning on any surgery at this time.  However she is walking with her walker tonight when she tripped because the cast is so heavy and she fell onto her left wrist.  Did not hit her head did not lose consciousness.  No chest wall pain, abdominal pain or other extremity pain.  Pain is moderate, constant, better with fentanyl given by EMS, worse with trying to move       History reviewed. No pertinent past medical history.  Patient Active Problem List   Diagnosis Date Noted  . Cervical radiculitis 03/13/2020  . 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 Complex tear of medial meniscus of right knee as current injury 03/04/2020  . Chronic pain of both knees 09/19/2019  . Insomnia 02/27/2019  . Lumbar degenerative disc disease 08/09/2018  . Anxiety 01/19/2018  . Controlled substance agreement signed 01/19/2018  . H/O bilateral hip replacements 01/04/2018  . 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 Excessive daytime sleepiness 04/14/2017  . Fibromyalgia 01/11/2017  . Vitamin D deficiency 01/11/2017  . Chronic coronary artery disease 01/18/2015  . Hyperlipidemia 01/18/2015  . Hypertension 01/18/2015  . Mitral valve prolapse 01/18/2015  . Chalazion 12/05/2013  . 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  Prior to Admission medications   Medication Sig Start Date End Date Taking? Authorizing Provider  calcium carbonate (TUMS - DOSED IN MG ELEMENTAL  CALCIUM) 500 MG chewable tablet Chew 500 mg by mouth daily as needed for indigestion or heartburn.    [provider]  Dietary Management Product (VB6 P5P PO) Take 1 capsule by mouth daily.    [provider]  diphenhydrAMINE (BENADRYL) 25 MG tablet Take 25 mg by mouth every 6 (six) hours as needed for allergies.    [provider]  docusate sodium (COLACE) 100 MG capsule Take 100 mg by mouth 2 (two) times daily.    [provider]  gabapentin (NEURONTIN) 300 MG capsule Take 300 mg by mouth 2 (two) times daily.    [provider]  ibuprofen (ADVIL) 200 MG tablet Take 400 mg by mouth every 8 (eight) hours as needed for moderate pain.    [provider]  Melatonin 1 MG CAPS Take 1 mg by mouth at bedtime.    [provider]  metoprolol succinate (TOPROL-XL) 25 MG 24 hr tablet Take 25 mg by mouth every evening. 08/24/20   [provider]  oxyCODONE-acetaminophen (PERCOCET) 5-325 MG tablet Take 1 tablet by mouth every 4 (four) hours as needed for up to 7 days for severe pain. 11/05/20 11/12/20  Arta Silence, MD  RESTASIS 0.05 % ophthalmic emulsion Place 1 drop into both eyes 2 (two) times daily. 10/23/20   [provider]  rosuvastatin (CRESTOR) 5 MG tablet Take 5 mg by mouth daily. 07/05/20   [provider]  traZODone (DESYREL) 50 MG tablet Take 50 mg by mouth at bedtime as needed for sleep. 07/03/20   [provider]  Vitamin D-Vitamin K (K2 PLUS D3 PO) Take 1 tablet by mouth daily.    [provider]    Allergies Hydrocodone-acetaminophen  No family history on file.  Social History      Review of Systems Constitutional: No fever/chills Eyes: No visual changes. ENT: No sore throat. Cardiovascular: Denies chest pain. Respiratory: Denies shortness of breath. Gastrointestinal: No abdominal pain.  No nausea, no vomiting.  No diarrhea.  No constipation. Genitourinary: Negative for  dysuria. Musculoskeletal: Negative for back pain.  Wrist pain Skin: Negative for rash. Neurological: Negative for headaches, focal weakness or numbness. All other ROS negative ____________________________________________   PHYSICAL EXAM:  VITAL SIGNS: Blood pressure 131/67, pulse 83, temperature 97.7 F (36.5 C), temperature source Oral, resp. rate 16, SpO2 97 %.  Constitutional: Alert and oriented. Well appearing and in no acute distress. Eyes: Conjunctivae are normal. EOMI. Head: Atraumatic. Nose: No congestion/rhinnorhea. Mouth/Throat: Mucous membranes are moist.   Neck: No stridor. Trachea Midline. FROM Cardiovascular: Normal rate, regular rhythm. Grossly normal heart sounds.  Good peripheral circulation. Respiratory: Normal respiratory effort.  No retractions. Lungs CTAB. Gastrointestinal: Soft and nontender. No distention. No abdominal bruits.  Musculoskeletal: Deformity of the left wrist with 2+ distal pulse. No open abrasions Neurologic:  Normal speech and language. No gross focal neurologic deficits are appreciated.  Skin:  Skin is warm, dry and intact. No rash noted. Psychiatric: Mood and affect are normal. Speech and behavior are normal. GU: Deferred   ____________________________________________   RADIOLOGY Robert Bellow, personally viewed and evaluated these images (plain radiographs) as part of my medical decision making, as well as reviewing the written report by the radiologist.  ED MD interpretation: X-ray shows fracture of the ulna and radius Repeat xray maybe slight improvement in alignment   Official radiology report(s): DG Forearm Left  Result Date: 11/11/2020 CLINICAL DATA:  Status post fall EXAM: LEFT FOREARM - 2 VIEW; LEFT WRIST - COMPLETE 3+ VIEW COMPARISON:  None. FINDINGS: Intra-articular, dorsally angulated, dorsolaterally displaced, comminuted, and impacted fracture of the distal radius. Minimally displaced fracture of the ulnar styloid.  Degenerative changes of the first carpometacarpal joint. No acute displaced fracture or dislocation of the proximal bones of the forearm. Visualized portion the elbow are grossly unremarkable. IMPRESSION: 1. Intra-articular, dorsally angulated, dorsolaterally displaced, comminuted, and impacted fracture of the distal radius. 2. Minimally displaced fracture of the ulnar styloid. 3. No acute displaced fracture or dislocation of the proximal bones of the forearm. Electronically Signed   By: Iven Finn M.D.   On: 11/11/2020 04:26   DG Wrist Complete Left  Result Date: 11/11/2020 CLINICAL DATA:  Status post fall EXAM: LEFT FOREARM - 2 VIEW; LEFT WRIST - COMPLETE 3+ VIEW COMPARISON:  None. FINDINGS: Intra-articular, dorsally angulated, dorsolaterally displaced, comminuted, and impacted fracture of the distal radius. Minimally displaced fracture of the ulnar styloid. Degenerative changes of the first carpometacarpal joint. No acute displaced fracture or dislocation of the proximal bones of the forearm. Visualized portion the elbow are grossly unremarkable. IMPRESSION: 1. Intra-articular, dorsally angulated, dorsolaterally displaced, comminuted, and impacted fracture of the distal radius. 2. Minimally displaced fracture of the ulnar styloid. 3. No acute displaced fracture or dislocation of the proximal bones of the forearm. Electronically Signed   By: Iven Finn M.D.   On: 11/11/2020 04:26    ____________________________________________   PROCEDURES  Procedure(s) performed (including Critical Care):  .Nerve Block  Date/Time: 11/11/2020 7:24 AM Performed by: Vanessa [place], MD Authorized by: Jari Pigg,  Royetta Crochet, MD   Consent:    Consent obtained:  Emergent situation   Consent given by:  Patient   Risks, benefits, and alternatives were discussed: yes     Risks discussed:  Allergic reaction, infection, bleeding, intravenous injection, pain, nerve damage, swelling and unsuccessful block   Alternatives  discussed:  No treatment Universal protocol:    Patient identity confirmed:  Verbally with patient Location:    Body area:  Upper extremity   Upper extremity nerve blocked: hematoma block    Laterality:  Left Pre-procedure details:    Skin preparation:  Chlorhexidine Skin anesthesia:    Skin anesthesia method:  None Procedure details:    Block needle gauge:  21 G   Anesthetic injected:  Lidocaine 1% w/o epi Post-procedure details:    Outcome:  Pain relieved   Procedure completion:  Tolerated well, no immediate complications .Splint Application  Date/Time: 11/11/2020 7:24 AM Performed by: Vanessa [place], MD Authorized by: Vanessa [place], MD   Consent:    Consent obtained:  Emergent situation Reduction of fracture  Date/Time: 11/11/2020 5:45 PM Performed by: Vanessa [place], MD Authorized by: Vanessa [place], MD  Consent: Verbal consent obtained. Consent given by: patient Patient understanding: patient states understanding of the procedure being performed Patient identity confirmed: verbally with patient Local anesthesia used: yes Anesthesia: hematoma block  Anesthesia: Local anesthesia used: yes Local Anesthetic: lidocaine 1% without epinephrine Anesthetic total: 10 mL  Sedation: Patient sedated: no  Patient tolerance: patient tolerated the procedure well with no immediate complications  .Splint Application  Date/Time: 11/11/2020 5:46 PM Performed by: Vanessa [place], MD Authorized by: Vanessa [place], MD   Consent:    Consent obtained:  Verbal   Consent given by:  Patient   Risks, benefits, and alternatives were discussed: yes     Risks discussed:  Discoloration, numbness, pain and swelling Universal protocol:    Patient identity confirmed:  Verbally with patient Pre-procedure details:    Distal neurologic exam:  Normal   Distal perfusion: distal pulses strong     Distal perfusion comment:  Unable to acces cap refill due to paint on nails Procedure details:     Location:  Wrist   Wrist location:  L wrist   Cast type:  Short arm   Splint type:  Sugar tong   Supplies:  Cotton padding and fiberglass Post-procedure details:    Distal neurologic exam:  Normal   Distal perfusion: distal pulses strong     Procedure completion:  Tolerated   Post-procedure imaging: reviewed       ____________________________________________   INITIAL IMPRESSION / ASSESSMENT AND PLAN / ED COURSE  Suzanne Escobar was evaluated in Emergency Department on 11/11/2020 for the symptoms described in the history of present illness. She was evaluated in the context of the global COVID-19 pandemic, which necessitated consideration that the patient might be at risk for infection with the SARS-CoV-2 virus that causes COVID-19. Institutional protocols and algorithms that pertain to the evaluation of patients at risk for COVID-19 are in a state of rapid change based on information released by regulatory bodies including the CDC and federal and state organizations. These policies and algorithms were followed during the patient's care in the ED.    Patient is a 75 year old who comes in with left wrist deformity status post fall.  Did not hit head to suggest intracranial hemorrhage.  Did not hit cervical spine to suggest cervical injury.  Will get x-rays to evaluate for  fracture.  No other abdominal tenderness, chest wall tenderness or other tenderness on any other extremity.  Patient already has a known ankle fracture being followed by orthopedics.  Discussed with patient this will make it very difficult for patient to ambulate with walker but she will need to have a wheelchair.  We discussed different options including placement to rehab facility versus going home with someone with her 24/7.  Patient states that her daughter has been with her and she would prefer to go home with wheelchair  Xray confirms fractures  Hematoma block, reduced and splinted  Repeat xray somewhat  improved.  D/w Dr. Marry Guan who is aware of patient and will follow up in clinic.   Family went to get a wheelchair and will dc home after         ____________________________________________   FINAL CLINICAL IMPRESSION(S) / ED DIAGNOSES   Final diagnoses:  Closed fracture of left wrist, initial encounter      MEDICATIONS GIVEN DURING THIS VISIT:  Medications  lidocaine (PF) (XYLOCAINE) 1 % injection 30 mL (30 mLs Other Given by Other 11/11/20 0630)  oxyCODONE (Oxy IR/ROXICODONE) immediate release tablet 5 mg (5 mg Oral Given 11/11/20 0816)     ED Discharge Orders         Ordered    Wheelchair        11/11/20 0721           Note:  This document was prepared using Dragon voice recognition software and may include unintentional dictation errors.   Vanessa [place], MD 11/11/20 [phone]

## 2020-11-11 NOTE — ED Triage Notes (Signed)
Pt states she feel attempting to get to restroom this morning. Is using a walker for mobility due to a left leg fracture on Tuesday, states she lost balance due to the weight of the cast. Per EMS, obvious deformity to left wrist, splint in place by EMS so this RN unable to visualize. Pt states pain is now 1/10 after 50 mcg IM fentanyl with EMS. Pt denies striking head or neck during fall.

## 2020-11-12 ENCOUNTER — Ambulatory Visit: Admit: 2020-11-12 | Payer: Medicare Other | Admitting: Orthopedic Surgery

## 2020-11-12 SURGERY — OPEN REDUCTION INTERNAL FIXATION (ORIF) TIBIA/FIBULA FRACTURE
Anesthesia: Choice | Site: Leg Lower | Laterality: Left

## 2020-11-14 ENCOUNTER — Other Ambulatory Visit
Admission: RE | Admit: 2020-11-14 | Discharge: 2020-11-14 | Disposition: A | Discharge status: Home / Self Care | Payer: Medicare Other | Source: Ambulatory Visit | Attending: Orthopedic Surgery | Admitting: Orthopedic Surgery

## 2020-11-14 ENCOUNTER — Other Ambulatory Visit: Payer: Self-pay

## 2020-11-14 DIAGNOSIS — Z01812 Encounter for preprocedural laboratory examination: Secondary | ICD-10-CM | POA: Diagnosis present

## 2020-11-14 DIAGNOSIS — Z20822 Contact with and (suspected) exposure to covid-19: Secondary | ICD-10-CM | POA: Insufficient documentation

## 2020-11-15 LAB — SARS CORONAVIRUS 2 (TAT 6-24 HRS): SARS Coronavirus 2: NEGATIVE

## 2020-11-17 MED ORDER — LACTATED RINGERS IV SOLN: INTRAVENOUS | Status: DC

## 2020-11-17 MED ORDER — CEFAZOLIN SODIUM-DEXTROSE 2-4 GM/100ML-% IV SOLN
2.0000 g | INTRAVENOUS | Status: AC
  Administered 2020-11-18: 2 g via INTRAVENOUS

## 2020-11-17 MED ORDER — CHLORHEXIDINE GLUCONATE 0.12 % MT SOLN: 15.0000 mL | Freq: Once | OROMUCOSAL | Status: AC

## 2020-11-17 MED ORDER — ORAL CARE MOUTH RINSE: 15.0000 mL | Freq: Once | OROMUCOSAL | Status: AC

## 2020-11-17 MED ORDER — CHLORHEXIDINE GLUCONATE 4 % EX LIQD: 60.0000 mL | Freq: Once | CUTANEOUS | Status: DC

## 2020-11-17 NOTE — H&P (Signed)
ORTHOPAEDIC HISTORY & PHYSICAL Suzanne Escobar, [place] - 11/14/2020 1:15 PM EST Formatting of this note is different from the original. Chief Complaint: Chief Complaint  Patient presents with  . Follow-up  Lt Tib/Fib fracture   Suzanne Escobar is a 75 y.o. female who presents today for repeat evaluation of a left tib-fib fracture. The patient was initially evaluated after she sustained a nondisplaced left distal tibia shaft and fibular shaft fracture on 11/05/2020. At her follow-up appointment x-rays of the left tib-fib demonstrated no displacement of the tibia shaft and fibular shaft fractures. Because of this the patient was left in her short leg splint and instructed to remain nonweightbearing to left lower extremity. The patient was discussed the risk and benefits of surgery versus nonsurgical intervention and did want to proceed with nonsurgical intervention. The patient presents today for splint removal and repeat x-rays of the left tib-fib. Patient is still experiencing moderate pain in left lower extremity, she denies any weightbearing to left lower extremity. She presents today to hopefully go into a short leg cast. She denies any new numbness or tingling, she does have a history of neuropathy.  Of note since the patient was last evaluated she did suffer a fall and went to the emergency room on 11/11/2020. The patient was attempting to walk with a walker and tripped causing her to fall onto her left wrist. X-rays of the left wrist demonstrated a intra-articular dorsally angulated displaced distal radius fracture. A closed reduction was attempted but was unsuccessful. The patient was then placed in a sugar tong splint for the left upper extremity. She denies any surgical history to the left upper extremity. The patient is right-hand dominant. She denies any personal history of heart attack, stroke, asthma or COPD. The patient does have a history of very mild mitral valve prolapse and recently  underwent ECHO in November last year no personal history of blood clots. Due to the displacement of the left wrist fracture, she presents today to discuss possible open reduction internal fixation left wrist.  Past Medical History: Past Medical History:  Diagnosis Date  . Allergy  . Anxiety  . Fibromyalgia  . GERD (gastroesophageal reflux disease)  . Heart palpitations  . High cholesterol  . MVP (mitral valve prolapse)  . Nasolacrimal duct obstruction  . Osteoarthritis   Past Surgical History: Past Surgical History:  Procedure Laterality Date  . EYELID SURGERY  . HYSTERECTOMY  . JOINT REPLACEMENT  bilateral hips  . LENS EYE SURGERY Right 07/11/2009  Dr. Isaiah Escobar, [place], 22.0D, [id number]  . LENS EYE SURGERY Left 08/03/2008  Dr. Thomasenia Escobar, [place], 23.5D, [place]  . NASOLACRIMAL DUCT EYE SURGERY Left  I&C of left lacrimal system  . REMOVAL SECONDARY MEMBRANOUS CATARACT Bilateral OD: 10/03/2008, OS: 11/15/2009  . REPLACEMENT TOTAL HIP W/ RESURFACING IMPLANTS Bilateral  . SHOULDER ARTHROSCOPY DISTAL CLAVICLE EXCISION AND OPEN ROTATOR CUFF REPAIR   Past Family History: Family History  Problem Relation Age of Onset  . Heart failure Mother  . Myocardial Infarction (Heart attack) Father  . Myocardial Infarction (Heart attack) Brother  . Myocardial Infarction (Heart attack) Brother  . Cancer Brother  . Glaucoma Neg Hx  . Macular degeneration Neg Hx   Medications: Current Outpatient Medications Ordered in Epic  Medication Sig Dispense Refill  . ascorbic acid, vitamin C, (VITAMIN C) 500 MG tablet Take 500 mg by mouth  . calcium carbonate (TUMS E-X ORAL) Take by mouth As needed  . cholecalciferol (VITAMIN D3) 1,000 unit capsule Take 1,000 Units  by mouth once daily  . cycloSPORINE (RESTASIS) 0.05 % ophthalmic emulsion Place 1 drop into both eyes 2 (two) times daily for 90 days 64 each 6  . docusate (COLACE) 100 MG capsule Take 100 mg by mouth 2 (two) times daily  . gabapentin  (NEURONTIN) 300 MG capsule Take 1 capsule (300 mg total) by mouth 2 (two) times daily 60 capsule 1  . ibuprofen (MOTRIN) 200 MG tablet Take 400 mg by mouth as needed for Pain  . magnesium oxide 500 mg Cap Take 500 mg by mouth once daily  . metoprolol succinate (TOPROL-XL) 25 MG XL tablet TAKE 1 TABLET(25 MG) BY MOUTH DAILY 90 tablet 1  . oxyCODONE-acetaminophen (PERCOCET) 5-325 mg tablet Take 1 tablet by mouth every 4 (four) hours as needed for Pain 20 tablet 0  . rosuvastatin (CRESTOR) 10 MG tablet Take 10 mg by mouth once daily  . traZODone (DESYREL) 50 MG tablet Take 1 tablet by mouth nightly as needed  . alpha lipoic acid 600 mg tablet Take 1 tablet (600 mg total) by mouth 2 (two) times daily (Patient not taking: Reported on 11/08/2020 ) 60 tablet 2  . aspirin 81 MG chewable tablet Take 81 mg by mouth every other day (Patient not taking: Reported on 11/08/2020 )  . biotin 1 mg tablet Take by mouth (Patient not taking: Reported on 11/08/2020 )  . carboxymethylcellulose (REFRESH TEARS) 0.5 % ophthalmic solution Place 1 drop into both eyes as needed for Dry Eyes.  (Patient not taking: Reported on 11/08/2020 )  . co-enzyme Q-10, ubiquinone, 30 mg capsule Take 30 mg by mouth (Patient not taking: Reported on 11/08/2020 )  . cycloSPORINE (RESTASIS) 0.05 % ophthalmic emulsion Place 1 drop into both eyes 2 (two) times daily for 90 days (Patient not taking: Reported on 11/08/2020 ) 64 each 6  . desonide (DESOWEN) 0.05 % cream (Patient not taking: Reported on 11/08/2020 )  . diclofenac 1 % Gel Apply 2 g topically as needed (Patient not taking: Reported on 11/08/2020 )  . levocetirizine (XYZAL) 5 MG tablet Take 5 mg by mouth every evening (Patient not taking: Reported on 11/08/2020 )  . magnesium citrate oral solution Take by mouth (Patient not taking: Reported on 11/08/2020 )  . metaxalone (SKELAXIN) 800 mg tablet Take 1 tablet (800 mg total) by mouth 3 (three) times daily as needed for Pain (for pain or spasm) for  up to 20 doses (Patient not taking: Reported on 11/08/2020 ) 20 tablet 0  . montelukast (SINGULAIR) 10 mg tablet TAKE 1 TABLET(10 MG) BY MOUTH DAILY (Patient not taking: Reported on 11/08/2020)  . omeprazole (PRILOSEC) 40 MG DR capsule TAKE 1 CAPSULE BY MOUTH DAILY 30 MINUTES BEFORE BREAKFAST (Patient not taking: Reported on 11/08/2020)  . potassium chloride (K-DUR,KLOR-CON) 20 MEQ CR tablet Take 20 mEq by mouth once daily (Patient not taking: Reported on 11/08/2020 )   No current Epic-ordered facility-administered medications on file.   Allergies: Allergies  Allergen Reactions  . Hydrocodone-Acetaminophen Itching  Can take if she takes benadryl with it.    Review of Systems:  A comprehensive 14 point ROS was performed, reviewed by me today, and the pertinent orthopaedic findings are documented in the HPI.  Exam: BP 128/72  Ht 157.5 cm (5\' 2" )  LMP (LMP Unknown)  BMI 34.20 kg/m  General/Constitutional: The patient appears to be well-nourished, well-developed, and in no acute distress. Neuro/Psych: Normal mood and affect, oriented to person, place and time. Eyes: Non-icteric. Pupils are equal,  round, and reactive to light, and exhibit synchronous movement. ENT: Unremarkable. Lymphatic: No palpable adenopathy. Respiratory: Lungs clear to auscultation, Normal chest excursion, No wheezes and Non-labored breathing Cardiovascular: Regular rate and rhythm. No murmurs. and No edema, swelling or tenderness, except as noted in detailed exam. Integumentary: No impressive skin lesions present, except as noted in detailed exam. Musculoskeletal: Unremarkable, except as noted in detailed exam.  The patient presents today in a wheelchair. The left upper extremity is in a sugar tong splint. She is able to gently flex and extend all digits without significant discomfort. The patient is intact light touch to the dorsal and volar aspect of the left hand. She is intact to light touch to the proximal aspect  of the splint as well. Swelling is minimal to the left hand at today's appointment.  The left short leg splint was removed at today's visit. Skin examination left foot demonstrates no significant erythema or ecchymosis. Still moderate swelling. There does appear to be a small area of irritation to the dorsal aspect of the left foot. The patient is able to gently flex and extend all of her toes without discomfort. She is intact light touch over the dorsal and plantar aspect left foot and throughout the left leg. The patient is able to very gently dorsiflex and plantarflex left foot with moderate discomfort.  Imaging: AP and lateral views of the left tibia and fibula were obtained today in the office and reviewed by me. These x-rays demonstrate what appears to be an essentially nondisplaced fracture of the distal tibia and fibula. The tibia fracture is oblique in nature and does appear to be comminuted, oblique fracture of the distal fibula is also noted. Does not involve the ankle mortise. There is at most minimal shifting when compared to previous x-rays. No other acute fractures visualized. No lytic lesions noted.  X-rays of the left wrist obtained at the emergency room were reviewed at today's visit these x-rays demonstrate a distal radius fracture with continued dorsal impaction and angulation. X-rays also demonstrate minimally displaced fracture of the ulnar styloid. An attempted close reduction was performed which was unsuccessful.  Impression: Closed fracture of left tibia and fibula, sequela [S82.202S, S82.402S] Closed fracture of left tibia and fibula, sequela (primary encounter diagnosis) Other closed fracture of distal end of left radius, initial encounter Closed extra-articular fracture of distal end of left tibia, initial encounter  Plan:  1. Treatment options were discussed today with the patient. 2. X-rays of the left tib-fib demonstrate no significant displacement compared to initial  x-rays. Plan will be to continue conservative treatment for the left leg. 3. The patient was placed into a short leg cast with the ankle in neutral position. Continue nonweightbearing to left lower extremity. The plan will be to remain in this cast for 4 weeks, can reevaluate condition of the cast at her first postop appointment. 4. The patient was given a refill of the Percocet which was prescribed to her last visit to take for discomfort. 5. Pertaining to the left wrist the patient was instructed on the risk and benefits of surgical versus nonsurgical intervention. Because of the displacement noted on x-rays at the emergency room, the patient was instructed on the risk and benefits of a open reduction internal fixation left wrist and wishes to proceed with surgery. 6. The patient will undergo a left distal radius ORIF with Dr. Marry Guan in the future. Risk and benefits were discussed in detail today's visit. 7. Prescription for gait belt in addition  to walker with forearm support were sent to [facility] medical supply store. 8. The patient will follow up per standard postop protocol  The procedure was discussed with the patient, as were the potential risks (including bleeding, infection, nerve and/or blood vessel injury, persistent or recurrent pain, failure of the reduction, progression of arthritis, need for further surgery, blood clots, strokes, heart attacks and/or arhythmias, pneumonia, etc.) and benefits. The patient states her understanding and wishes to proceed.  This office visit took 60 minutes, of which >50% involved patient counseling/education.  Review of the [place] CSRS was performed in accordance of the [place] prior to dispensing any controlled drugs.  This note was generated in part with voice recognition software and I apologize for any typographical errors that were not detected and corrected.  Raquel Roan [place], PA-C [place]  Electronically signed by Suzanne Corns, PA at 11/14/2020 2:45 PM EST

## 2020-11-18 ENCOUNTER — Ambulatory Visit: Payer: Medicare Other | Admitting: Anesthesiology

## 2020-11-18 ENCOUNTER — Encounter: Payer: Self-pay | Admitting: Orthopedic Surgery

## 2020-11-18 ENCOUNTER — Encounter
Admission: RE | Disposition: A | Discharge status: Home / Self Care | Payer: Self-pay | Source: Home / Self Care | Attending: Orthopedic Surgery

## 2020-11-18 ENCOUNTER — Ambulatory Visit
Admission: RE | Admit: 2020-11-18 | Discharge: 2020-11-18 | Disposition: A | Discharge status: Home / Self Care | Payer: Medicare Other | Attending: Orthopedic Surgery | Admitting: Orthopedic Surgery

## 2020-11-18 ENCOUNTER — Ambulatory Visit: Payer: Medicare Other

## 2020-11-18 ENCOUNTER — Other Ambulatory Visit: Payer: Self-pay

## 2020-11-18 DIAGNOSIS — Z885 Allergy status to narcotic agent status: Secondary | ICD-10-CM | POA: Insufficient documentation

## 2020-11-18 DIAGNOSIS — Z90710 Acquired absence of both cervix and uterus: Secondary | ICD-10-CM | POA: Diagnosis not present

## 2020-11-18 DIAGNOSIS — E7800 Pure hypercholesterolemia, unspecified: Secondary | ICD-10-CM | POA: Insufficient documentation

## 2020-11-18 DIAGNOSIS — Z7982 Long term (current) use of aspirin: Secondary | ICD-10-CM | POA: Diagnosis not present

## 2020-11-18 DIAGNOSIS — W010XXA Fall on same level from slipping, tripping and stumbling without subsequent striking against object, initial encounter: Secondary | ICD-10-CM | POA: Insufficient documentation

## 2020-11-18 DIAGNOSIS — Z96643 Presence of artificial hip joint, bilateral: Secondary | ICD-10-CM | POA: Diagnosis not present

## 2020-11-18 DIAGNOSIS — Z8249 Family history of ischemic heart disease and other diseases of the circulatory system: Secondary | ICD-10-CM | POA: Insufficient documentation

## 2020-11-18 DIAGNOSIS — I341 Nonrheumatic mitral (valve) prolapse: Secondary | ICD-10-CM | POA: Diagnosis not present

## 2020-11-18 DIAGNOSIS — Z79899 Other long term (current) drug therapy: Secondary | ICD-10-CM | POA: Insufficient documentation

## 2020-11-18 DIAGNOSIS — S52592A Other fractures of lower end of left radius, initial encounter for closed fracture: Secondary | ICD-10-CM | POA: Diagnosis present

## 2020-11-18 HISTORY — DX: Cardiac arrhythmia, unspecified: I49.9

## 2020-11-18 HISTORY — DX: Cardiac murmur, unspecified: R01.1

## 2020-11-18 HISTORY — DX: Fibromyalgia: M79.7

## 2020-11-18 HISTORY — DX: Unspecified osteoarthritis, unspecified site: M19.90

## 2020-11-18 HISTORY — PX: ORIF WRIST FRACTURE: SHX2133

## 2020-11-18 SURGERY — OPEN REDUCTION INTERNAL FIXATION (ORIF) WRIST FRACTURE
Anesthesia: General | Site: Wrist | Laterality: Left

## 2020-11-18 MED ORDER — PROPOFOL 10 MG/ML IV BOLUS
INTRAVENOUS | Status: DC | PRN
  Administered 2020-11-18: 140 mg via INTRAVENOUS

## 2020-11-18 MED ORDER — ACETAMINOPHEN 10 MG/ML IV SOLN
INTRAVENOUS | Status: DC | PRN
  Administered 2020-11-18: 1000 mg via INTRAVENOUS

## 2020-11-18 MED ORDER — ACETAMINOPHEN 10 MG/ML IV SOLN
INTRAVENOUS | Status: AC
  Filled 2020-11-18: qty 100

## 2020-11-18 MED ORDER — ONDANSETRON HCL 4 MG/2ML IJ SOLN
INTRAMUSCULAR | Status: AC
  Filled 2020-11-18: qty 2

## 2020-11-18 MED ORDER — METOPROLOL TARTRATE 5 MG/5ML IV SOLN
INTRAVENOUS | Status: AC
  Filled 2020-11-18: qty 5

## 2020-11-18 MED ORDER — DEXAMETHASONE SODIUM PHOSPHATE 10 MG/ML IJ SOLN
INTRAMUSCULAR | Status: DC | PRN
  Administered 2020-11-18: 5 mg via INTRAVENOUS

## 2020-11-18 MED ORDER — KETAMINE HCL 10 MG/ML IJ SOLN
INTRAMUSCULAR | Status: DC | PRN
Start: 2020-11-18 — End: 2020-11-18
  Administered 2020-11-18: 20 mg via INTRAVENOUS
  Administered 2020-11-18: 10 mg via INTRAVENOUS

## 2020-11-18 MED ORDER — BUPIVACAINE HCL (PF) 0.25 % IJ SOLN
INTRAMUSCULAR | Status: AC
  Filled 2020-11-18: qty 30

## 2020-11-18 MED ORDER — LIDOCAINE HCL (CARDIAC) PF 100 MG/5ML IV SOSY
PREFILLED_SYRINGE | INTRAVENOUS | Status: DC | PRN
  Administered 2020-11-18: 80 mg via INTRAVENOUS

## 2020-11-18 MED ORDER — KETAMINE HCL 50 MG/5ML IJ SOSY
PREFILLED_SYRINGE | INTRAMUSCULAR | Status: AC
  Filled 2020-11-18: qty 5

## 2020-11-18 MED ORDER — OXYCODONE HCL 5 MG PO TABS: 5.0000 mg | ORAL_TABLET | ORAL | 0 refills | Status: DC | PRN

## 2020-11-18 MED ORDER — FENTANYL CITRATE (PF) 100 MCG/2ML IJ SOLN
INTRAMUSCULAR | Status: AC
  Filled 2020-11-18: qty 2

## 2020-11-18 MED ORDER — BUPIVACAINE HCL (PF) 0.25 % IJ SOLN
INTRAMUSCULAR | Status: DC | PRN
Start: 2020-11-18 — End: 2020-11-18
  Administered 2020-11-18: 10 mL

## 2020-11-18 MED ORDER — OXYCODONE HCL 5 MG PO TABS
ORAL_TABLET | ORAL | Status: AC
  Filled 2020-11-18: qty 1

## 2020-11-18 MED ORDER — CHLORHEXIDINE GLUCONATE 0.12 % MT SOLN
OROMUCOSAL | Status: AC
  Administered 2020-11-18: 15 mL via OROMUCOSAL
  Filled 2020-11-18: qty 15

## 2020-11-18 MED ORDER — FENTANYL CITRATE (PF) 100 MCG/2ML IJ SOLN
INTRAMUSCULAR | Status: DC | PRN
  Administered 2020-11-18: 25 ug via INTRAVENOUS
  Administered 2020-11-18: 50 ug via INTRAVENOUS
  Administered 2020-11-18 (×3): 25 ug via INTRAVENOUS
  Administered 2020-11-18 (×2): 50 ug via INTRAVENOUS
  Administered 2020-11-18 (×2): 25 ug via INTRAVENOUS

## 2020-11-18 MED ORDER — FENTANYL CITRATE (PF) 100 MCG/2ML IJ SOLN
INTRAMUSCULAR | Status: AC
  Administered 2020-11-18: 50 ug via INTRAVENOUS
  Filled 2020-11-18: qty 2

## 2020-11-18 MED ORDER — ONDANSETRON HCL 4 MG/2ML IJ SOLN
INTRAMUSCULAR | Status: DC | PRN
  Administered 2020-11-18: 4 mg via INTRAVENOUS

## 2020-11-18 MED ORDER — OXYCODONE HCL 5 MG PO TABS
5.0000 mg | ORAL_TABLET | Freq: Once | ORAL | Status: AC | PRN
  Administered 2020-11-18: 5 mg via ORAL

## 2020-11-18 MED ORDER — OXYCODONE HCL 5 MG/5ML PO SOLN
5.0000 mg | Freq: Once | ORAL | Status: AC | PRN
Start: 2020-11-18 — End: 2020-11-18

## 2020-11-18 MED ORDER — FENTANYL CITRATE (PF) 100 MCG/2ML IJ SOLN
25.0000 ug | INTRAMUSCULAR | Status: DC | PRN
  Administered 2020-11-18: 50 ug via INTRAVENOUS

## 2020-11-18 MED ORDER — PHENYLEPHRINE HCL (PRESSORS) 10 MG/ML IV SOLN
INTRAVENOUS | Status: DC | PRN
  Administered 2020-11-18 (×3): 100 ug via INTRAVENOUS

## 2020-11-18 MED ORDER — METOPROLOL TARTRATE 5 MG/5ML IV SOLN
INTRAVENOUS | Status: DC | PRN
  Administered 2020-11-18 (×2): 2.5 mg via INTRAVENOUS

## 2020-11-18 MED ORDER — CEFAZOLIN SODIUM-DEXTROSE 2-4 GM/100ML-% IV SOLN
INTRAVENOUS | Status: AC
  Filled 2020-11-18: qty 100

## 2020-11-18 MED ORDER — ONDANSETRON HCL 4 MG/2ML IJ SOLN
4.0000 mg | Freq: Once | INTRAMUSCULAR | Status: AC | PRN
  Administered 2020-11-18: 4 mg via INTRAVENOUS

## 2020-11-18 SURGICAL SUPPLY — 49 items
BIT DRILL 2 FAST STEP (BIT) ×1 IMPLANT
BIT DRILL 2.5X4 QC (BIT) ×1 IMPLANT
BNDG ELASTIC 3X5.8 VLCR STR LF (GAUZE/BANDAGES/DRESSINGS) ×2 IMPLANT
BNDG ESMARK 4X12 TAN STRL LF (GAUZE/BANDAGES/DRESSINGS) ×2 IMPLANT
CANISTER SUCT 1200ML W/VALVE (MISCELLANEOUS) ×2 IMPLANT
COVER WAND RF STERILE (DRAPES) ×2 IMPLANT
CUFF TOURN SGL QUICK 18X4 (TOURNIQUET CUFF) ×2 IMPLANT
DRAPE FLUOR MINI C-ARM 54X84 (DRAPES) ×2 IMPLANT
DRSG DERMACEA 8X12 NADH (GAUZE/BANDAGES/DRESSINGS) ×2 IMPLANT
DURAPREP 26ML APPLICATOR (WOUND CARE) ×2 IMPLANT
ELECT CAUTERY BLADE 6.4 (BLADE) ×1 IMPLANT
ELECT REM PT RETURN 9FT ADLT (ELECTROSURGICAL) ×2
ELECTRODE REM PT RTRN 9FT ADLT (ELECTROSURGICAL) ×1 IMPLANT
GAUZE SPONGE 4X4 12PLY STRL (GAUZE/BANDAGES/DRESSINGS) ×2 IMPLANT
GLOVE INDICATOR 8.0 STRL GRN (GLOVE) ×2 IMPLANT
GLOVE SURG ENC TEXT LTX SZ7.5 (GLOVE) ×2 IMPLANT
GOWN STRL REUS W/ TWL LRG LVL3 (GOWN DISPOSABLE) ×2 IMPLANT
GOWN STRL REUS W/TWL LRG LVL3 (GOWN DISPOSABLE) ×4
K-WIRE 1.6 (WIRE) ×4
K-WIRE FX5X1.6XNS BN SS (WIRE) ×2
KIT TURNOVER KIT A (KITS) ×2 IMPLANT
KWIRE FX5X1.6XNS BN SS (WIRE) IMPLANT
MANIFOLD NEPTUNE II (INSTRUMENTS) ×2 IMPLANT
NDL FILTER BLUNT 18X1 1/2 (NEEDLE) ×1 IMPLANT
NEEDLE FILTER BLUNT 18X 1/2SAF (NEEDLE) ×1
NEEDLE FILTER BLUNT 18X1 1/2 (NEEDLE) ×1 IMPLANT
NS IRRIG 500ML POUR BTL (IV SOLUTION) ×2 IMPLANT
PACK EXTREMITY ARMC (MISCELLANEOUS) ×2 IMPLANT
PAD CAST CTTN 4X4 STRL (SOFTGOODS) ×2 IMPLANT
PADDING CAST COTTON 4X4 STRL (SOFTGOODS) ×4
PEG FULLY THREADED 2.5X22MM (Peg) ×2 IMPLANT
PLATE WIDE 28.2X62.6 LT (Plate) ×1 IMPLANT
SCREW BN 12X3.5XNS CORT TI (Screw) IMPLANT
SCREW CORT 3.5X12 (Screw) ×6 IMPLANT
SCREW PEG LOCK 2.5X18 (Peg) ×4 IMPLANT
SCREW PEG LOCK 2.5X20 (Peg) ×1 IMPLANT
SCREW PEG LOCK 2.5X24 (Peg) ×1 IMPLANT
SCREW PEG LOCK 2.5X26 (Peg) ×1 IMPLANT
SPLINT CAST 1 STEP 3X12 (MISCELLANEOUS) ×2 IMPLANT
STOCKINETTE 48X4 2 PLY STRL (GAUZE/BANDAGES/DRESSINGS) ×1 IMPLANT
STOCKINETTE STRL 4IN 9604848 (GAUZE/BANDAGES/DRESSINGS) ×2 IMPLANT
STRIP CLOSURE SKIN 1/2X4 (GAUZE/BANDAGES/DRESSINGS) ×2 IMPLANT
SUT VIC AB 0 CT2 27 (SUTURE) ×2 IMPLANT
SUT VIC AB 2-0 SH 27 (SUTURE) ×2
SUT VIC AB 2-0 SH 27XBRD (SUTURE) ×1 IMPLANT
SUT VIC AB 3-0 SH 27 (SUTURE) ×2
SUT VIC AB 3-0 SH 27X BRD (SUTURE) ×1 IMPLANT
SUT VIC AB 4-0 FS2 27 (SUTURE) ×2 IMPLANT
SYR 10ML LL (SYRINGE) ×2 IMPLANT

## 2020-11-18 NOTE — Anesthesia Procedure Notes (Signed)
Procedure Name: Intubation Performed by: Fredderick Phenix, CRNA Pre-anesthesia Checklist: Patient identified, Emergency Drugs available, Suction available and Patient being monitored Patient Re-evaluated:Patient Re-evaluated prior to induction Oxygen Delivery Method: Circle system utilized Preoxygenation: Pre-oxygenation with 100% oxygen Induction Type: IV induction Ventilation: Mask ventilation without difficulty LMA: LMA inserted LMA Size: 4.0 Tube type: Oral Number of attempts: 1 Airway Equipment and Method: Oral airway Placement Confirmation: positive ETCO2 and breath sounds checked- equal and bilateral Tube secured with: Tape Dental Injury: Teeth and Oropharynx as per pre-operative assessment

## 2020-11-18 NOTE — Op Note (Signed)
OPERATIVE NOTE  DATE OF SURGERY:  11/18/2020  PATIENT NAME:  Suzanne Escobar   DOB: July 10, 1946   MRN: [id number]  PRE-OPERATIVE DIAGNOSIS: Left distal radius fracture, comminuted  POST-OPERATIVE DIAGNOSIS:  Same  PROCEDURE:  Open reduction and internal fixation of the left distal radius fracture  SURGEON:  Marciano Sequin. M.D.  ANESTHESIA: general  ESTIMATED BLOOD LOSS: Minimal  FLUIDS REPLACED: 800 mL of crystalloid  TOURNIQUET TIME: 82 minutes  DRAINS: None  IMPLANTS UTILIZED: [employer] DVRA-WL volar plate, 9 - 2.66mm threaded pegs, 3 - 3.39mm cortical screws  INDICATIONS FOR SURGERY: [name] is a 75 y.o. year old female who sustained a left distal radius fracture. After discussion of the risks and benefits of surgical intervention, the patient expressed understanding of the risks benefits and agree with plans for open reduction and internal fixation.   PROCEDURE IN DETAIL: The patient was brought into the operating room and after adequate general anesthesia, a tourniquet was placed on the patient's left upper arm.The left hand and arm were prepped with alcohol and Duraprep and draped in the usual sterile fashion. A "time-out" was performed as per usual protocol. The hand and forearm were exsanguinated using an Esmarch and the tourniquet was inflated to 250 mmHg. Loupe magnification was used throughout the procedure. A volar longitudinal incision was made in line with the flexor carpi radialis tendon. The FCR tendon was reflected in a radial fashion and the floor of the tendon sheath was incised. Dissection was carried down to the pronator quadratus. The pronator quadratus was incised in an L-shaped fashion and elevated off of the volar surface of the distal radius. The fracture site was visualized and the fracture was reduced. A [employer] DVRA wide volar plate was provisionally positioned and stabilized using K wires. Reduction of the fracture and position of  the implant were visualized in multiple planes using the FluoroScan. A 3.5 mm cortical screw was placed in the slotted position. Next, 5 fully threaded 2.5 mm threaded pegs were placed in the proximal row with position confirmed using the FluoroScan. In a similar fashion, 4 fully threaded 2.5 mm threaded pegs were placed in the distal row. Finally, the proximal portion of the plate was additionally stabilized with 2 additional 3.5 mm cortical screws. The wound was irrigated with copious amounts of normal saline with antibiotic solution. The pronator quadratus was repaired over the plate using #4-1 Vicryl. Tourniquet was deflated after total tourniquet time of 82 minutes. Good hemostasis was appreciated. The subcutaneous tissue was approximated with interrupted sutures of #2-0 Vicryl. The skin was closed with a running subcuticular suture of #4-0 Vicryl. Steri-Strips were applied. 0.25% Marcaine was injected along the incision site. A sterile dressing was applied followed by application of a volar splint.   The patient tolerated the procedure well and was transported to the PACU in stable condition.  Kaysha Parsell P. Holley Bouche., M.D.

## 2020-11-18 NOTE — Discharge Instructions (Signed)
Outpatient Surgery, Adult, Care After This sheet gives you information about how to care for yourself after your procedure. Your health care provider may also give you more specific instructions. If you have problems or questions, contact your health care provider. What can I expect after the procedure? After the procedure, it is common to have:  Tenderness and numbness at the surgical site.  Swelling, bruising, and numbness around the surgical site.  Nausea. Follow these instructions at home: For the time period you were told by your health care provider:  Rest.  Do not participate in activities where you could fall or become injured.  Do not drive or use machinery.  Do not drink alcohol.  Do not take sleeping pills or medicines that cause drowsiness.  Do not make important decisions or sign legal documents.  Do not take care of children on your own. Medicines  Take over-the-counter and prescription medicines only as told by your health care provider.  If you were prescribed an antibiotic medicine, take it as told by your health care provider. Do not stop taking the antibiotic even if you start to feel better.  Ask your health care provider if the medicine prescribed to you: ? Requires you to avoid driving or using machinery. ? Can cause constipation. You may need to take these actions to prevent or treat constipation:  Drink enough fluid to keep your urine pale yellow.  Take over-the-counter or prescription medicines.  Eat foods that are high in fiber, such as beans, whole grains, and fresh fruits and vegetables.  Limit foods that are high in fat and processed sugars, such as fried or sweet foods. Eating and drinking  Follow the diet recommended by your health care provider.  When you are hungry, begin eating light and bland foods, such as toast. Gradually return to your regular diet.  If you vomit: ? Drink clear fluids slowly and in small amounts as you are able.  Clear fluids include water, ice chips, low-calorie sports drinks, and fruit juice that has water added (diluted fruit juice). ? Eat bland, easy-to-digest foods in small amounts as you are able. These foods include bananas, applesauce, rice, lean meats, toast, and crackers. Incision care  Follow instructions from your health care provider about how to take care of an incision, if you have one. Make sure you: ? Wash your hands with soap and water for at least 20 seconds before and after you change your bandage (dressing). If soap and water are not available, use hand sanitizer. ? Change your dressing as told by your health care provider. ? Leave stitches (sutures), skin glue, or adhesive strips in place. These skin closures may need to stay in place for 2 weeks or longer. If adhesive strip edges start to loosen and curl up, you may trim the loose edges. Do not remove adhesive strips completely unless your health care provider tells you to do that.  Check your incision area every day for signs of infection. Check for: ? Redness, swelling, or pain. ? Fluid or blood. ? Warmth. ? Pus or a bad smell.   Activity  Do not play contact sports until your health care provider says it is okay.  Follow instructions from your health care provider about lifting heavy objects. You may be told not to lift things that weigh more than a certain amount.  Return to your normal activities as told by your health care provider. Ask your health care provider what activities are safe for you.  General instructions  If you have sleep apnea, surgery and certain medicines can increase your risk for breathing problems. Follow instructions from your health care provider about wearing your sleep device: ? Anytime you are sleeping, including during daytime naps. ? While taking prescription pain medicines, sleep medicines, or medicines that make you drowsy.  Have a responsible adult stay with you for the time you are told.  It is important to have someone help care for you until you are awake and alert.  Do not use any products that contain nicotine or tobacco, such as cigarettes, e-cigarettes, and chewing tobacco. These can delay healing after surgery. If you need help quitting, ask your health care provider.  Ask your health care provider when you can take baths or showers, swim, or use a hot tub. You may only be allowed to take sponge baths.  Keep all follow-up visits as told by your health care provider. This is important. Contact a health care provider if:  You have any of these signs of infection: ? Redness, swelling, or pain around your incision or IV site. ? Fluid or blood coming from your incision. ? Warmth coming from your incision. ? Pus or a bad smell coming from your incision. ? A fever.  You feel light-headed or you faint.  You develop a rash.  You keep feeling nauseous or keep vomiting.  You have severe pain, even after taking the medicines your health care provider has prescribed or recommended.  You have constipation. Get help right away if:  You cannot urinate.  You have trouble breathing.  You have chest pain.  Your legs become painful or swollen. These symptoms may represent a serious problem that is an emergency. Do not wait to see if the symptoms will go away. Get medical help right away. Call your local emergency services (911 in the [place] Do not drive yourself to the hospital. Summary  Nausea is common after a procedure.  Have a responsible adult stay with you for the time you are told. It is important to have someone help care for you until you are awake and alert.  Follow the diet recommended by your health care provider. If you vomit, drink clear fluids slowly and eat bland, easy-to-digest foods in small amounts.  Ask your health care provider what activities are safe for you. This information is not intended to replace advice given to you by your health care  provider. Make sure you discuss any questions you have with your health care provider. Document Revised: 01/12/2020 Document Reviewed: 07/06/2019 Elsevier Patient Education  2021 [employer].  Instructions after Hand / Wrist Surgery   Laurice Record. Holley Bouche., M.D.  Dept. of [facility]  [place], [place]  [address]   Phone: [phone]   Fax: [phone]   DIET: . Drink plenty of non-alcoholic fluids & begin a light diet. Marland Kitchen Resume your normal diet the day after surgery.  ACTIVITY:  . Keep the hand elevated above the level of the elbow. . Begin gently moving the fingers on a regular basis to avoid stiffness. . Avoid any heavy lifting, pushing, or pulling with the operative hand. . Do not drive or operate any equipment until instructed.  WOUND CARE:  . Keep the splint/bandage clean and dry.  . The splint and stitches will be removed in the office. . Continue to use the ice packs periodically to reduce pain and swelling. . You may bathe or shower  after the stitches are removed at the first office visit following surgery.  MEDICATIONS: . Dennis Bast may resume your regular medications. . Please take the pain medication as prescribed. . Do not take pain medication on an empty stomach. . Do not drive or drink alcoholic beverages when taking pain medications.  CALL THE OFFICE FOR: . Temperature above 101 degrees . Excessive bleeding or drainage on the dressing. . Excessive swelling, coldness, or paleness of the fingers. . Persistent nausea and vomiting.  FOLLOW-UP:  . You should have an appointment to return to the office in 7-10 days after surgery.   REMEMBER: R.I.C.E. = Rest, Ice, Compression, Elevation !    [facility] Department Directory         [email or web address]       [email or web address]          Cardiology  Appointments: [place] McCoole [phone]   Endocrinology  Appointments: [place] [phone] [place] [phone]  Gastroenterology  Appointments: [place] [phone] [place] [phone]        General Surgery   Appointments: [facility]  Internal Medicine/Family Medicine  Appointments: [facility] [place] - [phone] [place] [phone]  Metabolic and [place] Loss Surgery  Appointments: [facility]        Neurology  Appointments: [place] [phone] [place] - [phone]  Neurosurgery  Appointments: [place]  Obstetrics & Gynecology  Appointments: [place] [phone] [place] - [phone]        Pediatrics  Appointments: [place] [phone] [place] - [phone]  Physiatry  Appointments: [place] [phone]  Physical Therapy  Appointments: [place] [phone]        Podiatry  Appointments: [place] [phone] [place] - [phone]  Pulmonology  Appointments: [place]  Rheumatology  Appointments: [place] [phone]        [place] Location: [facility]  [address] [place], [place]  [address]  [place] Location: [facility] [address] [place], [place]  [address]  [place] Location: [facility] [address] [place], [place]  [address]

## 2020-11-18 NOTE — Transfer of Care (Signed)
Immediate Anesthesia Transfer of Care Note  Patient: Suzanne Escobar  Procedure(s) Performed: OPEN REDUCTION INTERNAL FIXATION OF LEFT DISTAL RADIUS FRACTURE (Left Wrist)  Patient Location: PACU  Anesthesia Type:General  Level of Consciousness: drowsy  Airway & Oxygen Therapy: Patient Spontanous Breathing and Patient connected to face mask oxygen  Post-op Assessment: Report given to RN  Post vital signs: stable  Last Vitals:  Vitals Value Taken Time  BP 138/71 11/18/20 1836  Temp    Pulse 98 11/18/20 1837  Resp 7 11/18/20 1837  SpO2 100 % 11/18/20 1837  Vitals shown include unvalidated device data.  Last Pain:  Vitals:   11/18/20 1434  TempSrc: Tympanic  PainSc: 1          Complications: No complications documented.

## 2020-11-18 NOTE — Anesthesia Preprocedure Evaluation (Addendum)
Anesthesia Evaluation  Patient identified by MRN, date of birth, ID band Patient awake    Reviewed: Allergy & Precautions, H&P , NPO status , Patient's Chart, lab work & pertinent test results  History of Anesthesia Complications (+) PROLONGED EMERGENCE and history of anesthetic complications  Airway Mallampati: II  TM Distance: >3 FB     Dental  (+) Teeth Intact, Implants   Pulmonary neg pulmonary ROS, neg sleep apnea, neg COPD,    breath sounds clear to auscultation       Cardiovascular hypertension, (-) angina+ CAD  (-) Past MI and (-) Cardiac Stents (-) dysrhythmias  Rhythm:regular Rate:Normal     Neuro/Psych Anxiety negative neurological ROS     GI/Hepatic negative GI ROS, Neg liver ROS,   Endo/Other  negative endocrine ROS  Renal/GU      Musculoskeletal  (+) Arthritis , Fibromyalgia -  Abdominal   Peds  Hematology negative hematology ROS (+)   Anesthesia Other Findings No past medical history on file.  No past surgical history on file.     Reproductive/Obstetrics negative OB ROS                          Anesthesia Physical Anesthesia Plan  ASA: II  Anesthesia Plan: General LMA   Post-op Pain Management:    Induction:   PONV Risk Score and Plan: Dexamethasone, Ondansetron and Treatment may vary due to age or medical condition  Airway Management Planned:   Additional Equipment:   Intra-op Plan:   Post-operative Plan:   Informed Consent: I have reviewed the patients History and Physical, chart, labs and discussed the procedure including the risks, benefits and alternatives for the proposed anesthesia with the patient or authorized representative who has indicated his/her understanding and acceptance.     Dental Advisory Given  Plan Discussed with: Anesthesiologist, CRNA and Surgeon  Anesthesia Plan Comments: (Pt offered peripheral nerve block and declined.  KR)        Anesthesia Quick Evaluation

## 2020-11-18 NOTE — Anesthesia Postprocedure Evaluation (Signed)
Anesthesia Post Note  Patient: Suzanne Escobar  Procedure(s) Performed: OPEN REDUCTION INTERNAL FIXATION OF LEFT DISTAL RADIUS FRACTURE (Left Wrist)  Patient location during evaluation: PACU Anesthesia Type: General Level of consciousness: awake and alert Pain management: pain level controlled Vital Signs Assessment: post-procedure vital signs reviewed and stable Respiratory status: spontaneous breathing, nonlabored ventilation, respiratory function stable and patient connected to nasal cannula oxygen Cardiovascular status: blood pressure returned to baseline and stable Postop Assessment: no apparent nausea or vomiting Anesthetic complications: no   No complications documented.   Last Vitals:  Vitals:   11/18/20 1945 11/18/20 2027  BP: 137/82 139/75  Pulse: 85 82  Resp: 15 (!) 22  Temp:    SpO2: 100% 99%    Last Pain:  Vitals:   11/18/20 2000  TempSrc:   PainSc: 7                  Martha Clan

## 2020-11-18 NOTE — H&P (Signed)
The patient has been re-examined, and the chart reviewed, and there have been no interval changes to the documented history and physical.    The risks, benefits, and alternatives have been discussed at length. The patient expressed understanding of the risks benefits and agreed with plans for surgical intervention.  Sadao Weyer P. Clebert Wenger, Jr. M.D.    

## 2020-11-19 ENCOUNTER — Encounter: Payer: Self-pay | Admitting: Orthopedic Surgery

## 2021-02-13 ENCOUNTER — Other Ambulatory Visit: Payer: Self-pay | Admitting: Physical Medicine & Rehabilitation

## 2021-02-13 DIAGNOSIS — M5412 Radiculopathy, cervical region: Secondary | ICD-10-CM

## 2021-03-11 ENCOUNTER — Other Ambulatory Visit: Payer: Self-pay | Admitting: Surgery

## 2021-03-11 DIAGNOSIS — M7581 Other shoulder lesions, right shoulder: Secondary | ICD-10-CM

## 2021-03-16 ENCOUNTER — Ambulatory Visit
Admission: RE | Admit: 2021-03-16 | Discharge: 2021-03-16 | Disposition: A | Discharge status: Home / Self Care | Payer: Medicare Other | Source: Ambulatory Visit | Attending: Surgery | Admitting: Surgery

## 2021-03-16 ENCOUNTER — Other Ambulatory Visit: Payer: Self-pay

## 2021-03-16 DIAGNOSIS — M7581 Other shoulder lesions, right shoulder: Secondary | ICD-10-CM | POA: Insufficient documentation

## 2021-03-16 IMAGING — MR MR SHOULDER*R* W/O CM
4 of 5 series · 31 of 40 positions shown · non-contrast
Comparison: None.

CLINICAL DATA: History of shoulder surgery. Multiple falls over the
last year. Right shoulder pain for several months.

EXAM:
MRI OF THE RIGHT SHOULDER WITHOUT CONTRAST
TECHNIQUE: Multiplanar, multisequence MR imaging of the shoulder was performed.
No intravenous contrast was administered.

[Series 3: T2 fat-sat · axial · right · 4.0mm · 0.44mm/px · z∈[-65,+55]mm · 8 of 26 slices shown (1 of 3)]
[im 1/26]
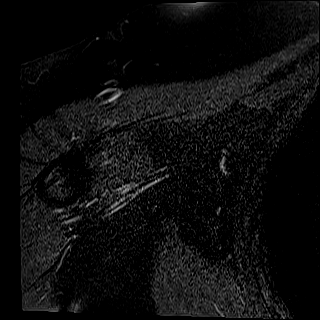
[im 4/26]
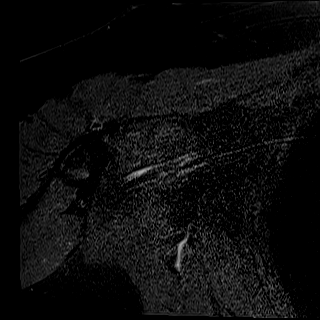
[im 8/26]
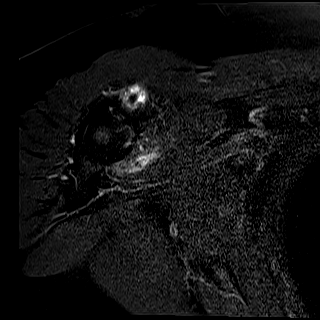
[im 11/26]
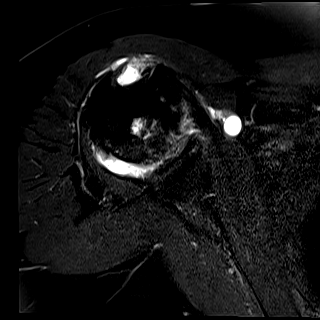
[im 15/26]
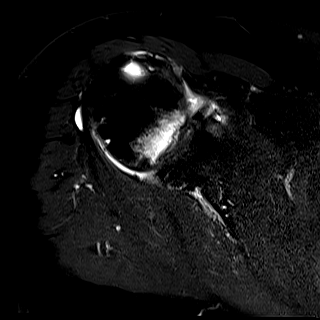
[im 18/26]
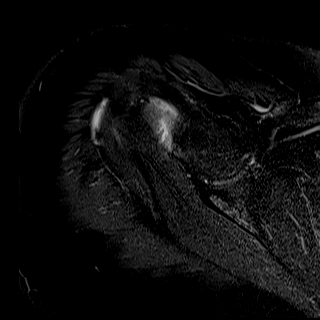
[im 22/26]
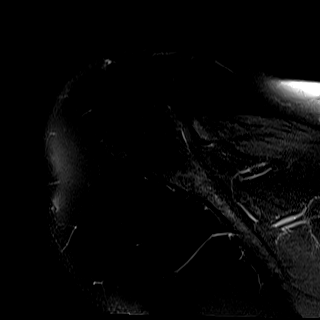
[im 26/26]
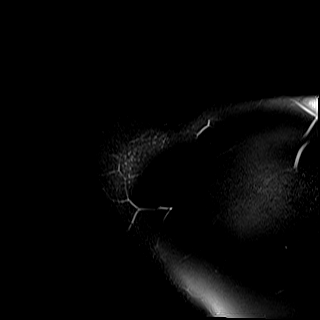

[Series 4: PD · oblique · right · 4.0mm · 0.44mm/px · 9 of 26 slices shown]
[im 1/26]
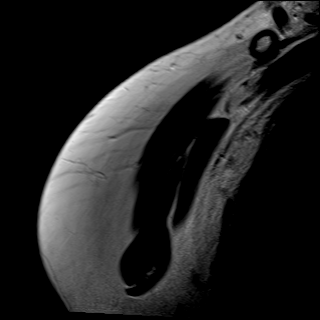
[im 4/26]
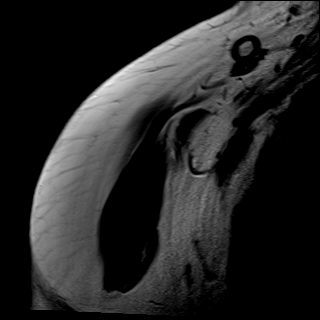
[im 7/26]
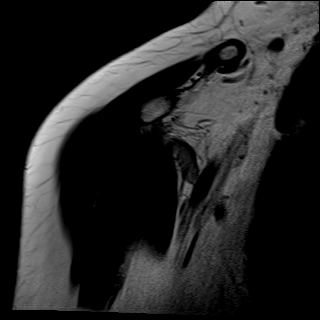
[im 10/26]
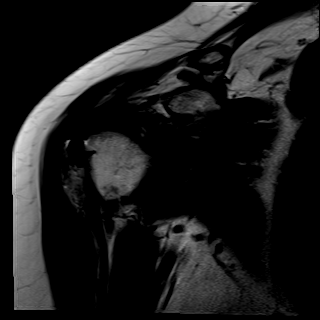
[im 13/26]
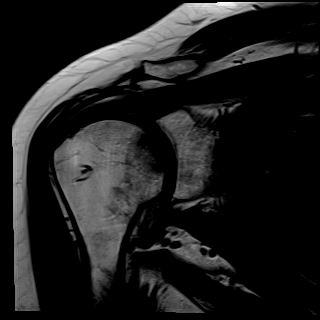
[im 16/26]
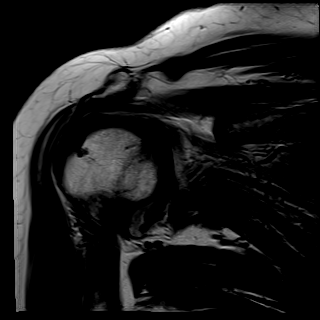
[im 19/26]
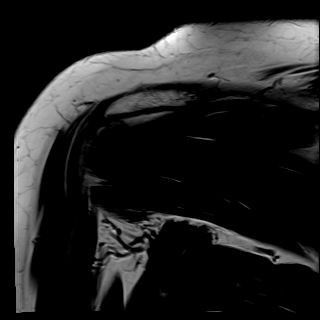
[im 22/26]
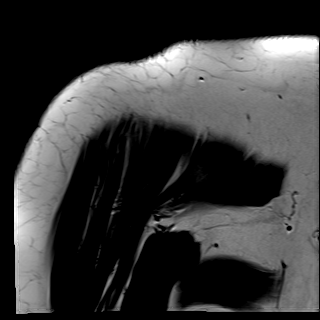
[im 26/26]
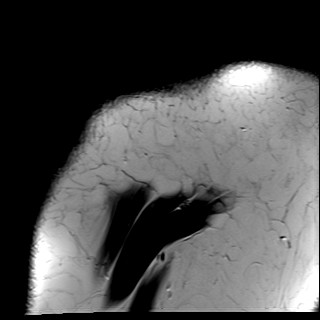

[Series 5: T2 fat-sat · oblique · right · 4.0mm · 0.44mm/px · 9 of 26 slices shown (2 of 3)]
[im 1/26]
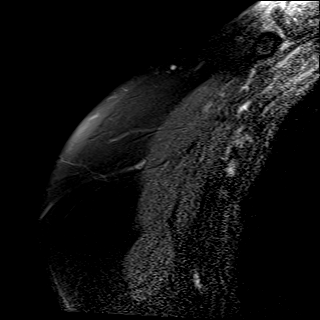
[im 4/26]
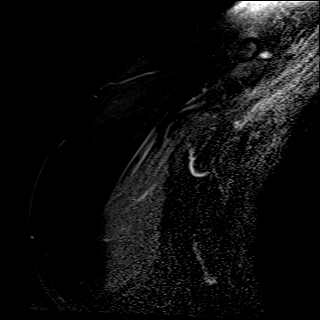
[im 7/26]
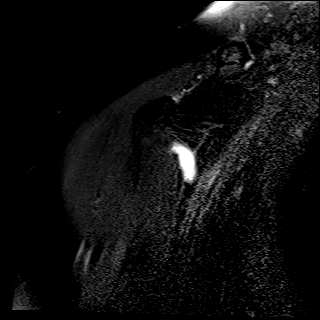
[im 10/26]
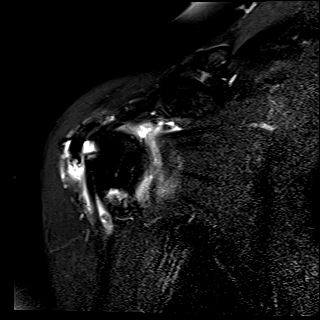
[im 13/26]
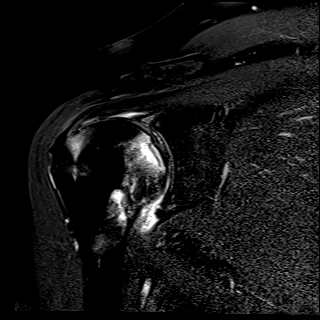
[im 16/26]
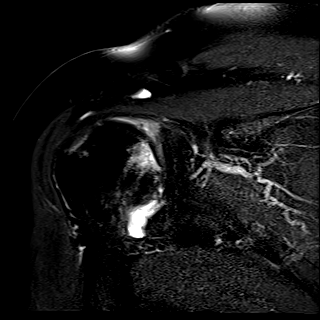
[im 19/26]
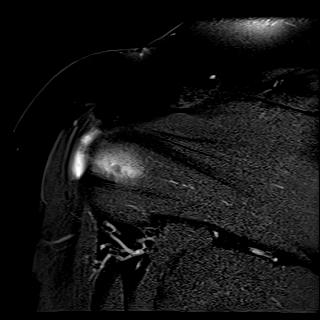
[im 22/26]
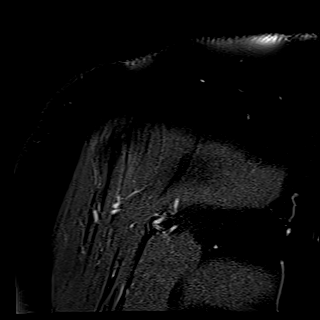
[im 26/26]
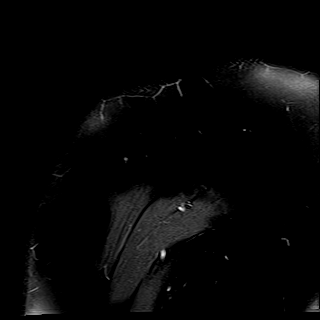

[Series 6: T2 fat-sat · oblique · right · 4.0mm · 0.22mm/px · 5 of 22 slices shown (3 of 3)]
[im 1/22]
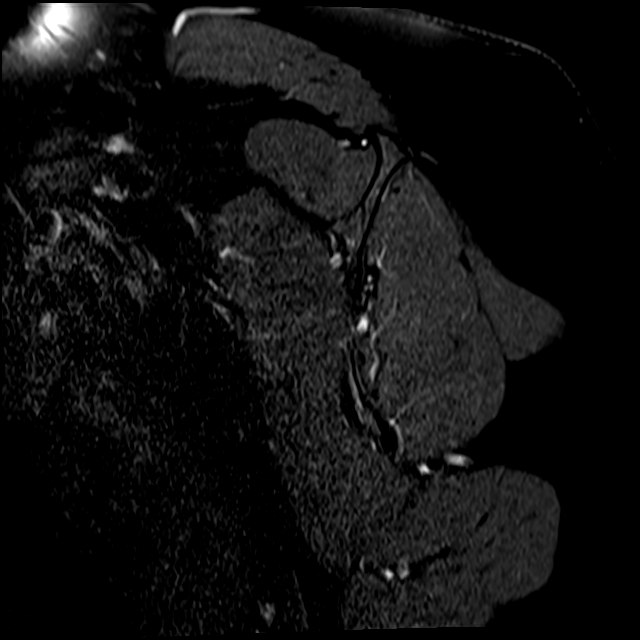
[im 4/22]
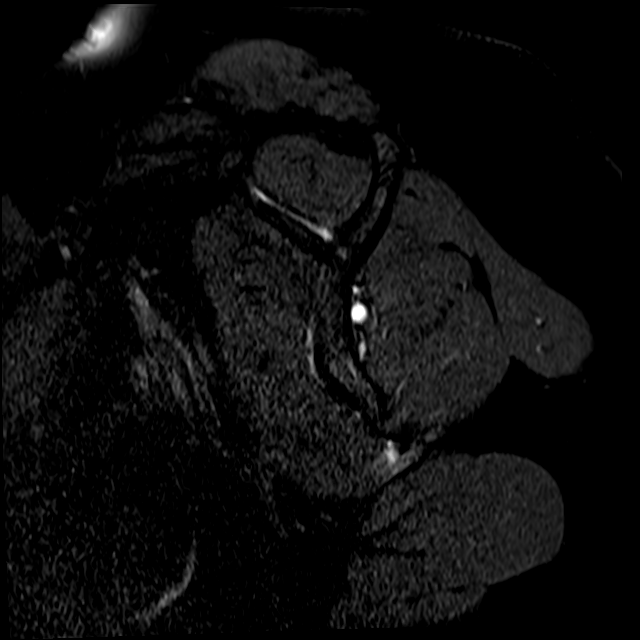
[im 8/22]
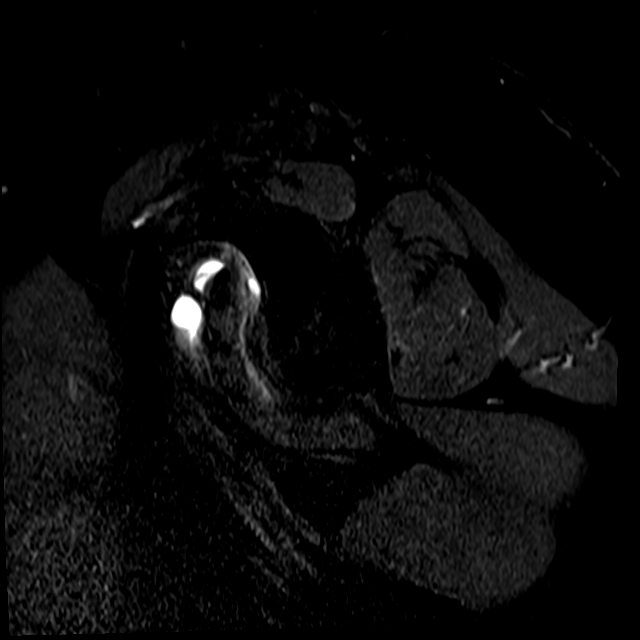
[im 11/22]
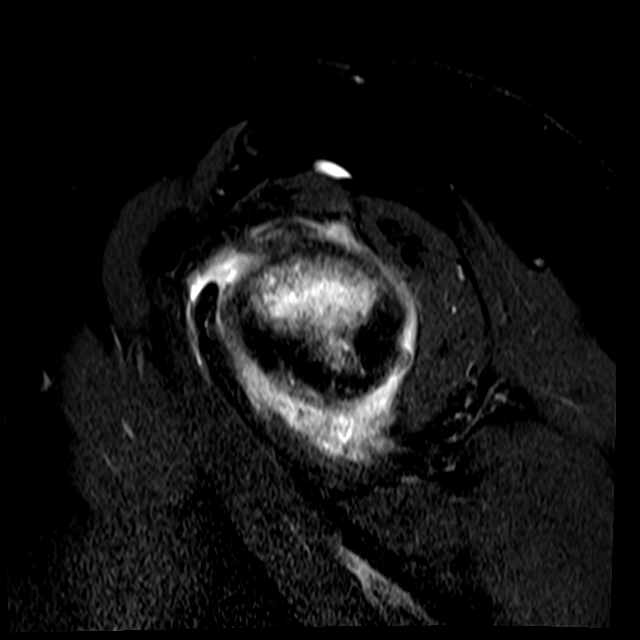
[im 18/22]
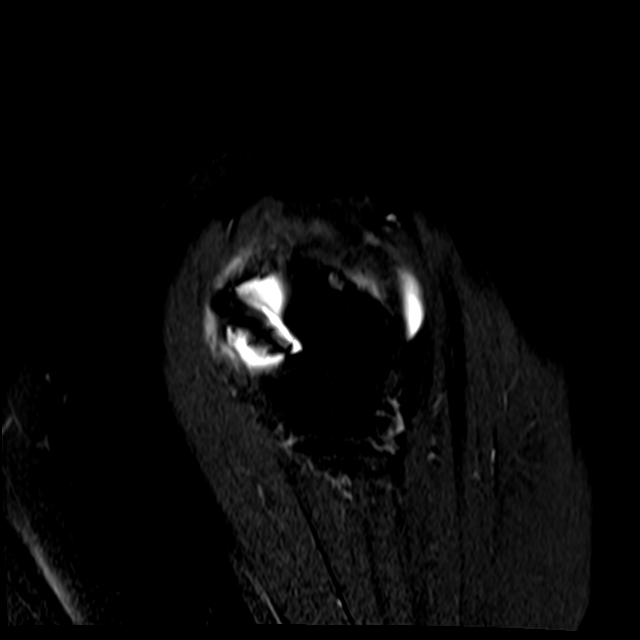

[31 of 40 positions shown; findings below may reference images not displayed]

FINDINGS: Rotator cuff: Prior supraspinatus repair. There is mild distal
supraspinatus and infraspinatus tendinosis. Teres minor tendon is
intact. Subscapularis tendon is intact. No retracted cuff tear.

Muscles: No muscle atrophy or edema. No intramuscular fluid
collection or hematoma.

Biceps Long Head: Intraarticular and extraarticular portions of the
biceps tendon are intact.

Acromioclavicular Joint: Minimal arthropathy of the
acromioclavicular joint. Small amount of subacromial/subdeltoid
bursal fluid.

Glenohumeral Joint: Small effusion with synovitis. Moderate-severe
glenohumeral chondrosis.

Labrum: Diffuse degenerative labral tearing. Joint fluid extends
into the subcoracoid bursa.

Bones: There is a subchondral fracture in the medial humeral head
the extensive bony edema.

Other: No fluid collection or hematoma.
IMPRESSION: Subchondral fracture of the medial humeral head with extensive
associated bony edema. Moderate-severe glenohumeral chondrosis.

Mild distal supraspinatus and infraspinatus tendinosis with evidence
of prior supraspinatus repair. No evidence of recurrent or retracted
cuff tear.

Diffuse degenerative labral tearing. Small joint effusion with
synovitis.

## 2021-04-04 ENCOUNTER — Emergency Department: Payer: Medicare Other

## 2021-04-04 ENCOUNTER — Other Ambulatory Visit: Payer: Self-pay

## 2021-04-04 ENCOUNTER — Emergency Department
Admission: EM | Admit: 2021-04-04 | Discharge: 2021-04-04 | Disposition: A | Discharge status: Home / Self Care | Payer: Medicare Other | Attending: Student in an Organized Health Care Education/Training Program | Admitting: Student in an Organized Health Care Education/Training Program

## 2021-04-04 DIAGNOSIS — Z79899 Other long term (current) drug therapy: Secondary | ICD-10-CM | POA: Insufficient documentation

## 2021-04-04 DIAGNOSIS — I10 Essential (primary) hypertension: Secondary | ICD-10-CM | POA: Insufficient documentation

## 2021-04-04 DIAGNOSIS — R0789 Other chest pain: Secondary | ICD-10-CM | POA: Diagnosis present

## 2021-04-04 DIAGNOSIS — Z8616 Personal history of COVID-19: Secondary | ICD-10-CM | POA: Insufficient documentation

## 2021-04-04 DIAGNOSIS — Z96643 Presence of artificial hip joint, bilateral: Secondary | ICD-10-CM | POA: Diagnosis not present

## 2021-04-04 LAB — BASIC METABOLIC PANEL
Anion gap: 6 (ref 5–15)
BUN: 15 mg/dL (ref 8–23)
CO2: 25 mmol/L (ref 22–32)
Calcium: 8.8 mg/dL — ABNORMAL LOW (ref 8.9–10.3)
Chloride: 107 mmol/L (ref 98–111)
Creatinine, Ser: 0.65 mg/dL (ref 0.44–1.00)
GFR, Estimated: >60 mL/min (ref >60)
Glucose, Bld: 117 mg/dL — ABNORMAL HIGH (ref 70–99)
Potassium: 4.3 mmol/L (ref 3.5–5.1)
Sodium: 138 mmol/L (ref 135–145)

## 2021-04-04 LAB — CBC
HCT: 33.7 % — ABNORMAL LOW (ref 36.0–46.0)
Hemoglobin: 11.1 g/dL — ABNORMAL LOW (ref 12.0–15.0)
MCH: 31.8 pg (ref 26.0–34.0)
MCHC: 32.9 g/dL (ref 30.0–36.0)
MCV: 96.6 fL (ref 80.0–100.0)
Platelets: 240 10*3/uL (ref 150–400)
RBC: 3.49 MIL/uL — ABNORMAL LOW (ref 3.87–5.11)
RDW: 14.1 % (ref 11.5–15.5)
WBC: 6 10*3/uL (ref 4.0–10.5)
nRBC: 0 % (ref 0.0–0.2)

## 2021-04-04 LAB — TROPONIN I (HIGH SENSITIVITY)
Troponin I (High Sensitivity): 4 ng/L (ref <18)
Troponin I (High Sensitivity): 4 ng/L (ref <18)

## 2021-04-04 LAB — D-DIMER, QUANTITATIVE: D-Dimer, Quant: 0.53 ug/mL-FEU — ABNORMAL HIGH (ref 0.00–0.50)

## 2021-04-04 IMAGING — US US EXTREM LOW VENOUS
1 series · 13 of 24 positions shown · non-contrast
Comparison: None.

CLINICAL DATA: Lower extremity swelling



[Series 1: us venous img lower bilat (dvt) · portal-venous · 13 of 57 slices shown]
[im 1/57]
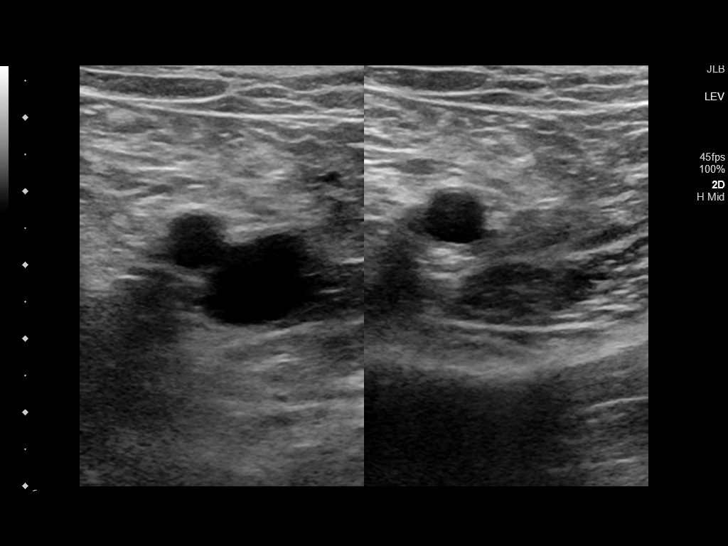
[im 5/57]
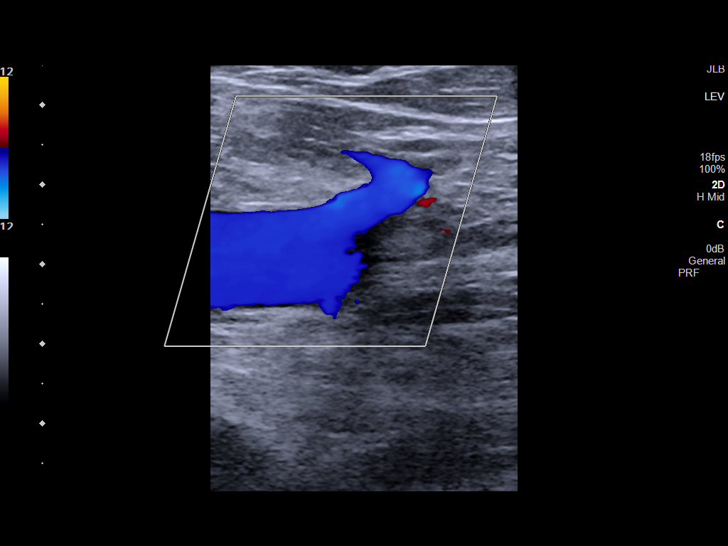
[im 10/57]
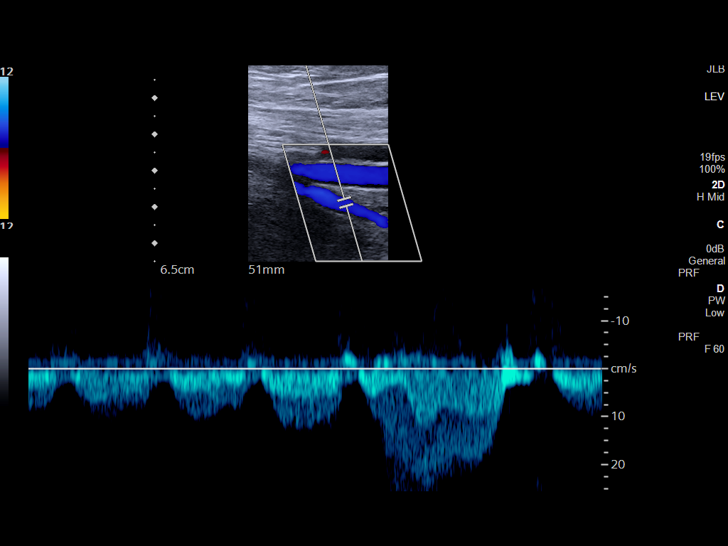
[im 15/57]
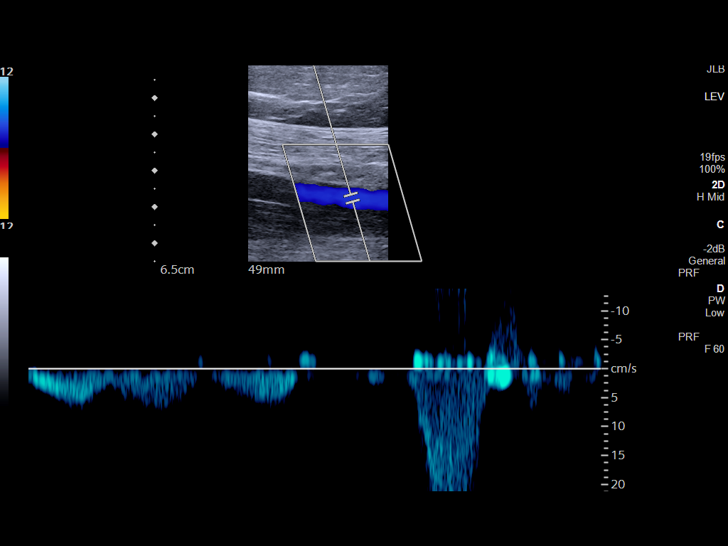
[im 20/57]
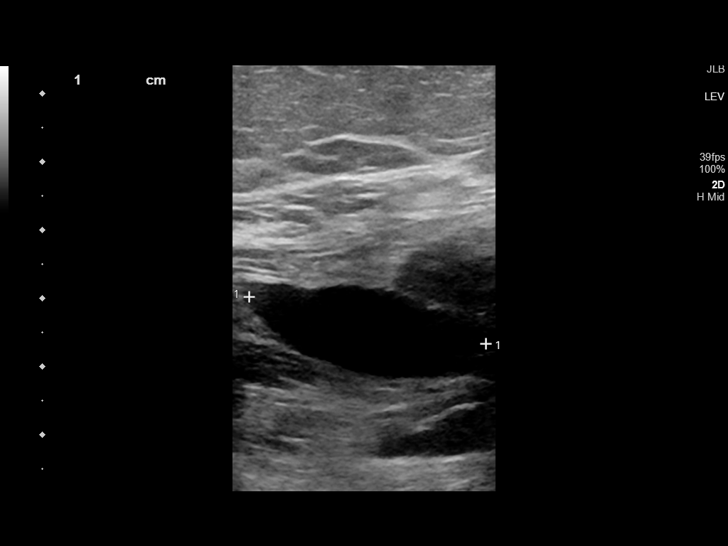
[im 25/57]
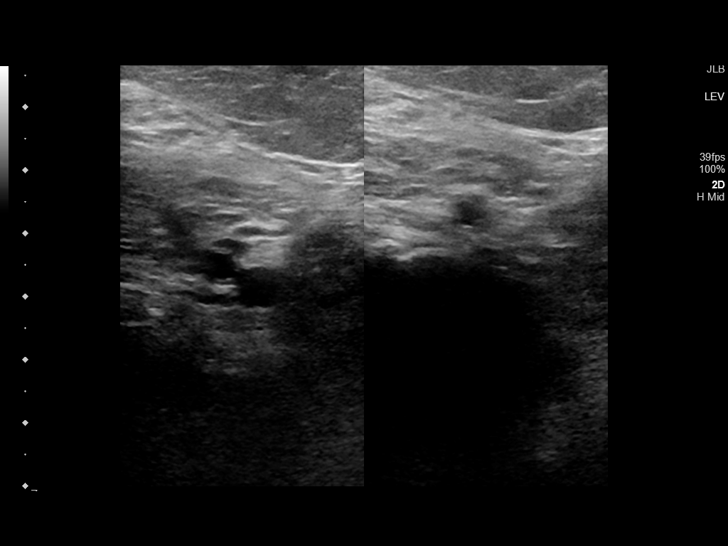
[im 30/57]
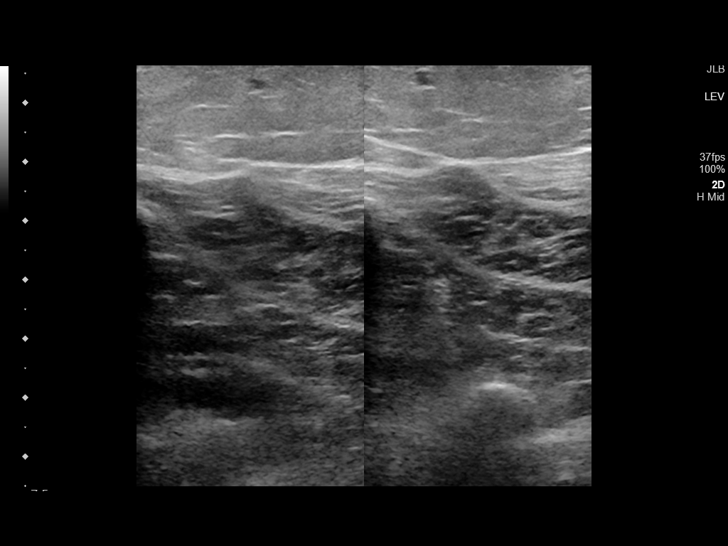
[im 32/57]
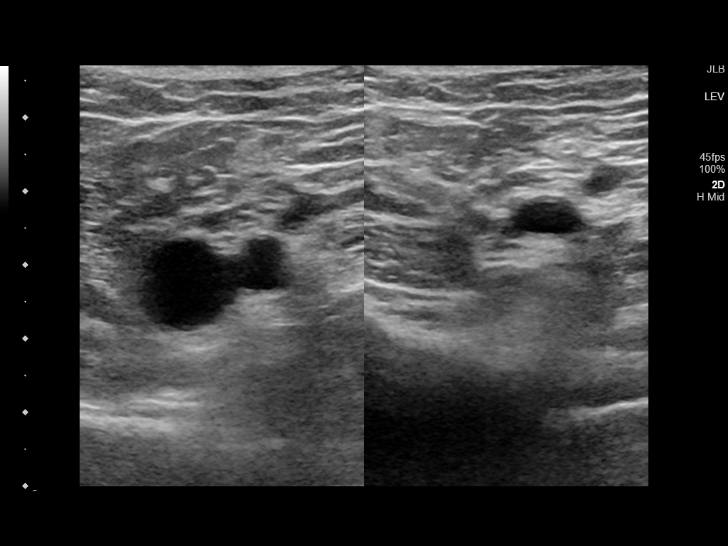
[im 37/57]
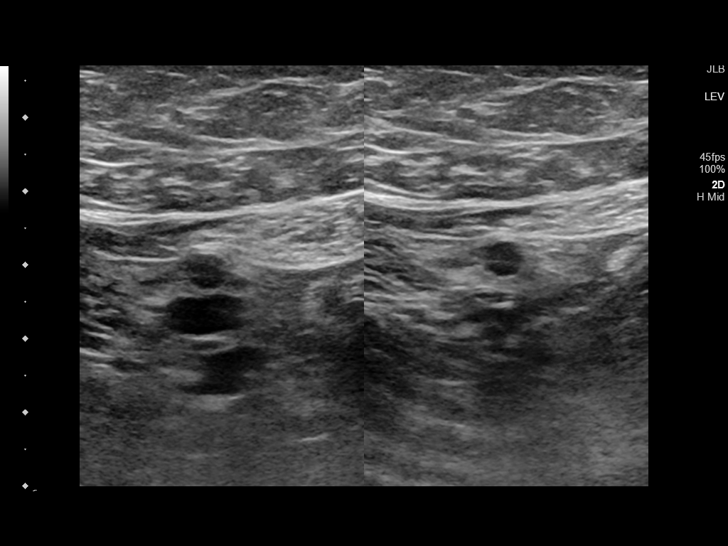
[im 42/57]
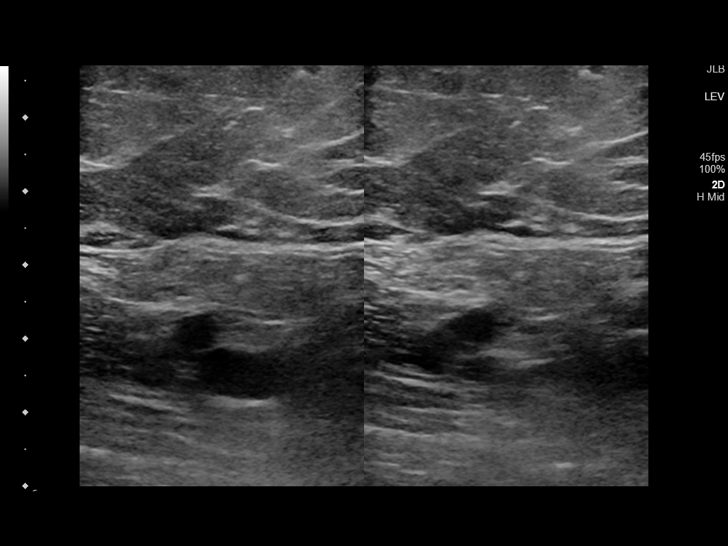
[im 47/57]
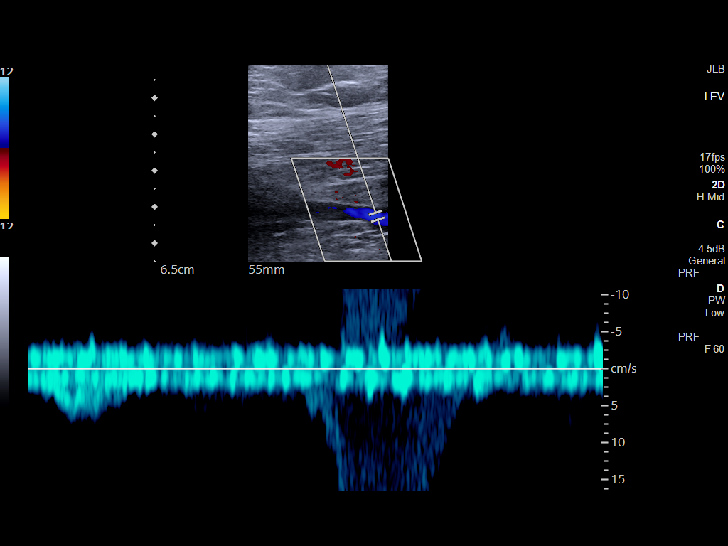
[im 52/57]
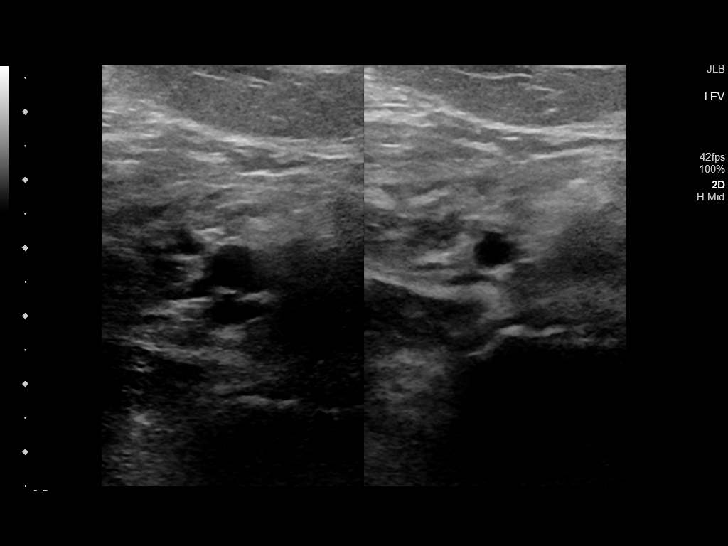
[im 57/57]
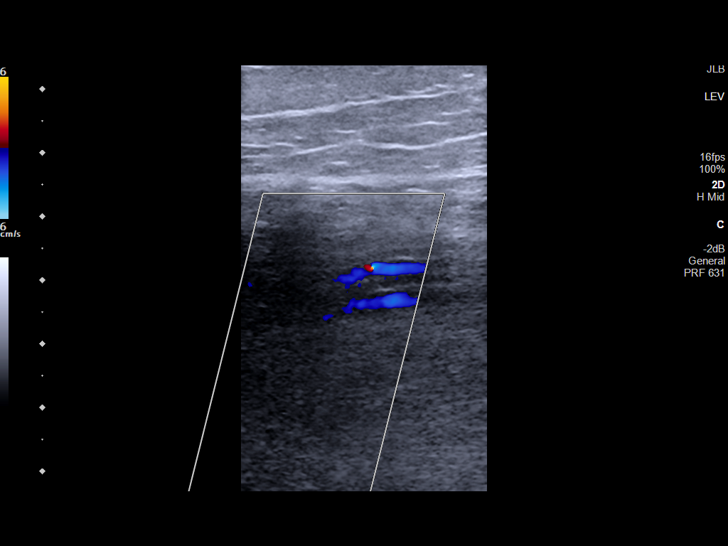

[13 of 24 positions shown; findings below may reference images not displayed]

FINDINGS: RIGHT LOWER EXTREMITY

Common Femoral Vein: No evidence of thrombus. Normal
compressibility, respiratory phasicity and response to augmentation.

Saphenofemoral Junction: No evidence of thrombus. Normal
compressibility and flow on color Doppler imaging.

Profunda Femoral Vein: No evidence of thrombus. Normal
compressibility and flow on color Doppler imaging.

Femoral Vein: No evidence of thrombus. Normal compressibility,
respiratory phasicity and response to augmentation.

Popliteal Vein: No evidence of thrombus. Normal compressibility,
respiratory phasicity and response to augmentation.

Calf Veins: No evidence of thrombus. Normal compressibility and flow
on color Doppler imaging.

Superficial Great Saphenous Vein: No evidence of thrombus. Normal
compressibility.

Venous Reflux:  None.

Other Findings:  None.

LEFT LOWER EXTREMITY

Common Femoral Vein: No evidence of thrombus. Normal
compressibility, respiratory phasicity and response to augmentation.

Saphenofemoral Junction: No evidence of thrombus. Normal
compressibility and flow on color Doppler imaging.

Profunda Femoral Vein: No evidence of thrombus. Normal
compressibility and flow on color Doppler imaging.

Femoral Vein: No evidence of thrombus. Normal compressibility,
respiratory phasicity and response to augmentation.

Popliteal Vein: No evidence of thrombus. Normal compressibility,
respiratory phasicity and response to augmentation.

Calf Veins: Left peroneal vein is not visualized. Otherwise no
evidence of thrombus. Normal compressibility and flow on color
Doppler imaging.

Superficial Great Saphenous Vein: No evidence of thrombus. Normal
compressibility.

Venous Reflux:  None.

Other Findings:  None.
IMPRESSION: No evidence of deep venous thrombosis in either lower extremity.

## 2021-04-04 IMAGING — CT CT ANGIO CHEST
2 of 6 series · 19 of 46 positions shown · IV contrast (APPLIED)
Comparison: Chest radiograph [DATE] report, images not
retrievable at the time of exam

CLINICAL DATA: PE suspected, low/intermediate prob, positive
D-dimer

EXAM:
CT ANGIOGRAPHY CHEST WITH CONTRAST
TECHNIQUE: Multidetector CT imaging of the chest was performed using the
standard protocol during bolus administration of intravenous
contrast. Multiplanar CT image reconstructions and MIPs were
obtained to evaluate the vascular anatomy.
CONTRAST:  75mL OMNIPAQUE IOHEXOL 350 MG/ML SOLN

[Series 5: thins · axial · 0.61mm/px · z∈[-335,-125]mm · 16 of 287 slices shown]
[im 12/287  lung]
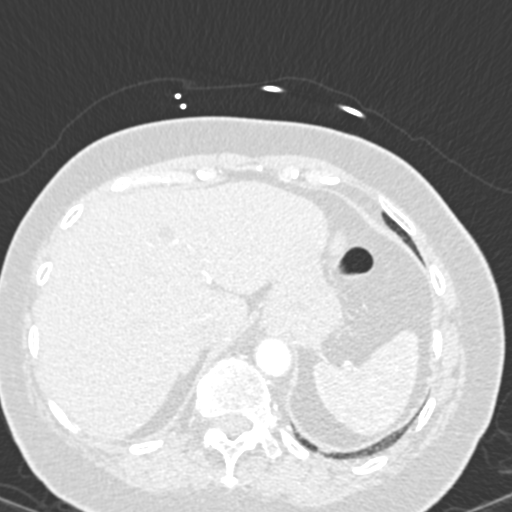
[im 36/287  soft-tissue]
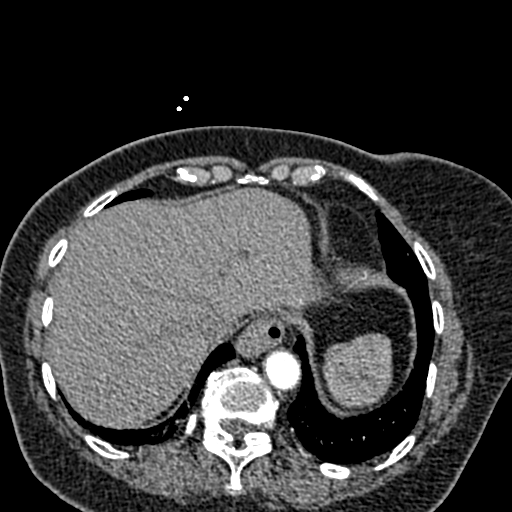
[im 48/287  lung]
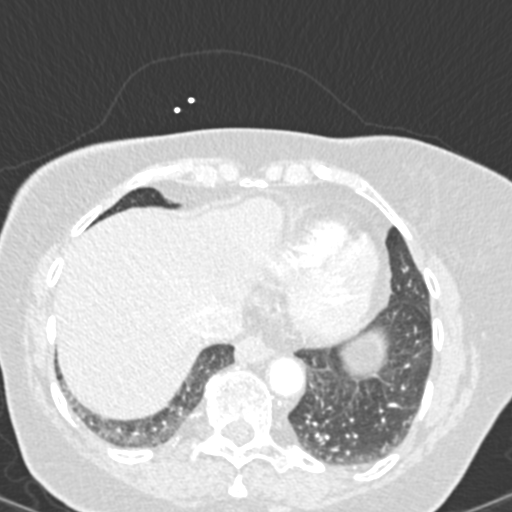
[im 72/287  soft-tissue]
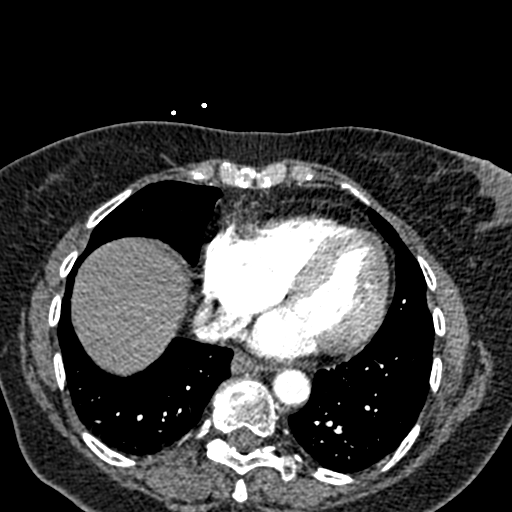
[im 84/287  lung]
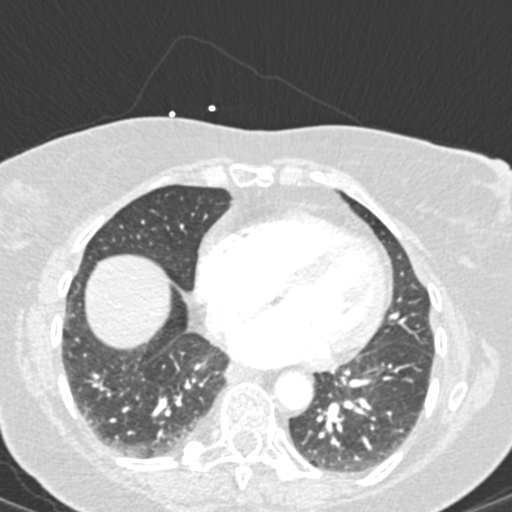
[im 96/287  soft-tissue]
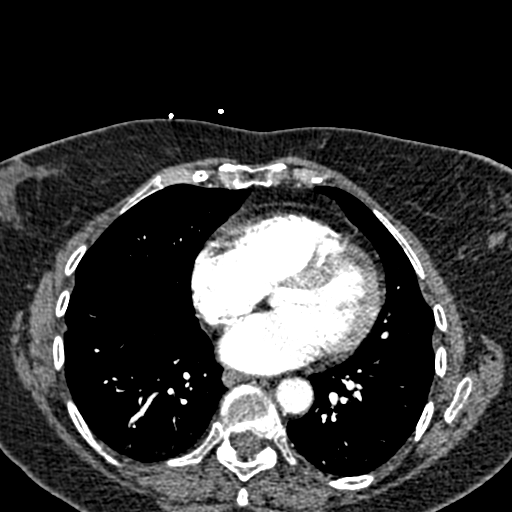
[im 120/287  lung]
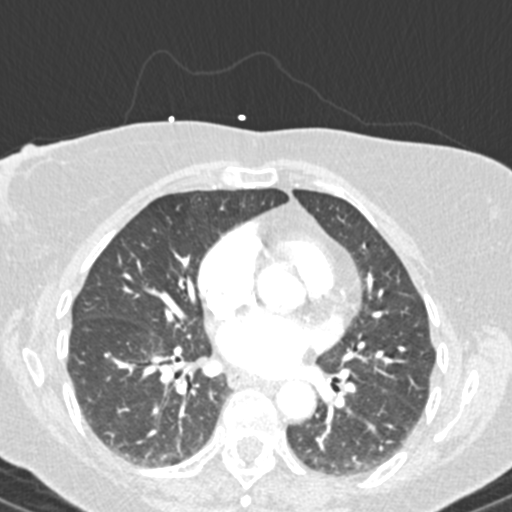
[im 132/287  soft-tissue]
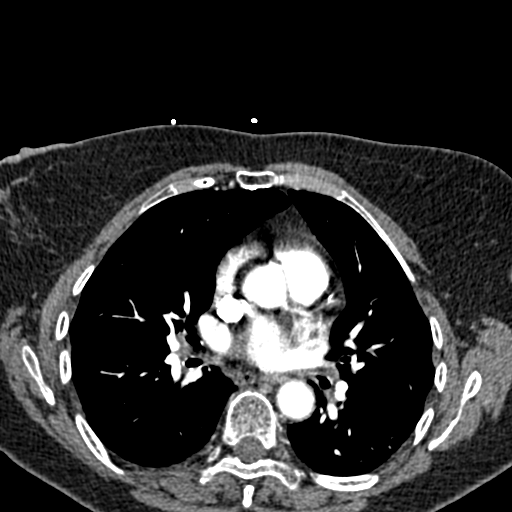
[im 155/287  lung]
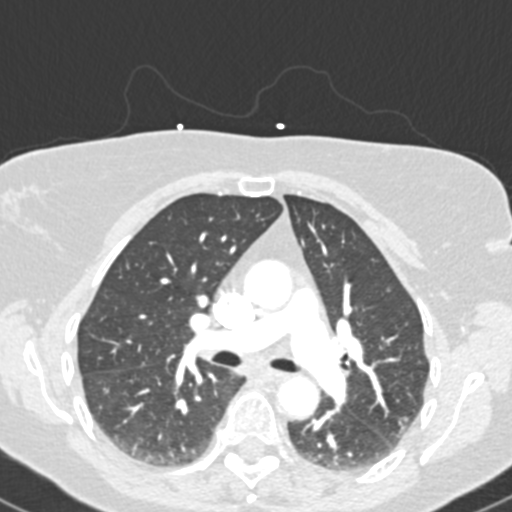
[im 167/287  soft-tissue]
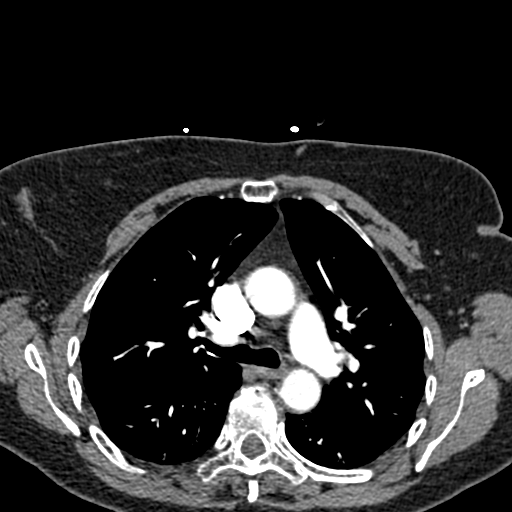
[im 191/287  lung]
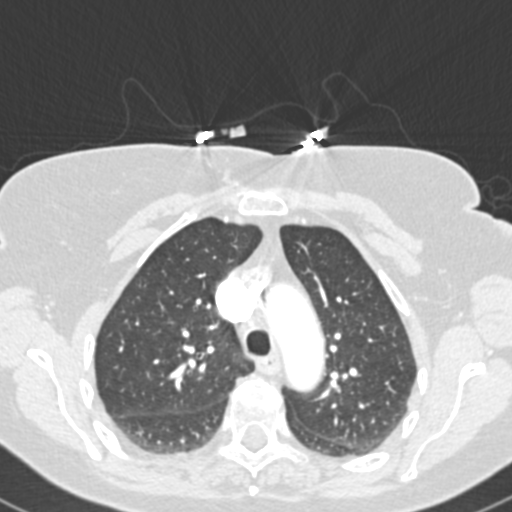
[im 203/287  soft-tissue]
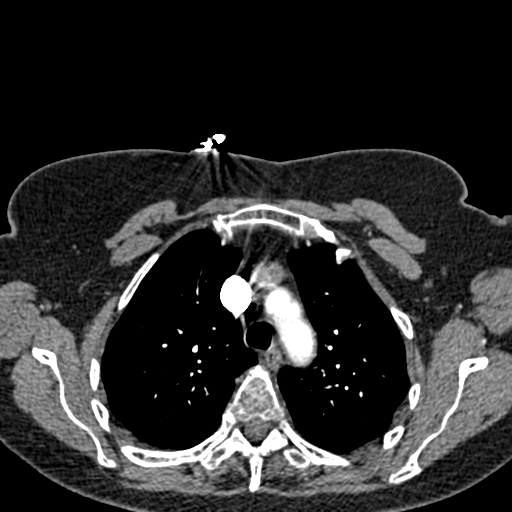
[im 215/287  lung]
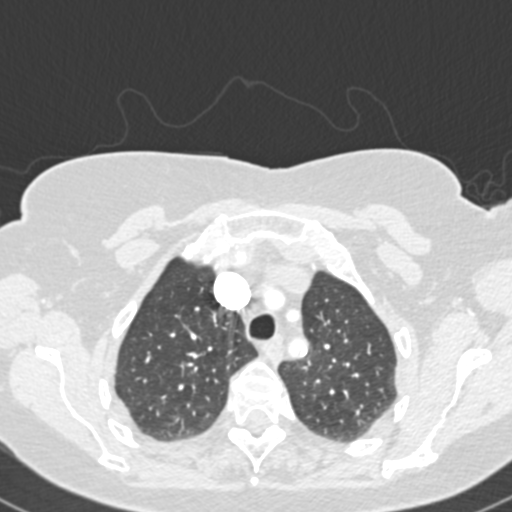
[im 239/287  soft-tissue]
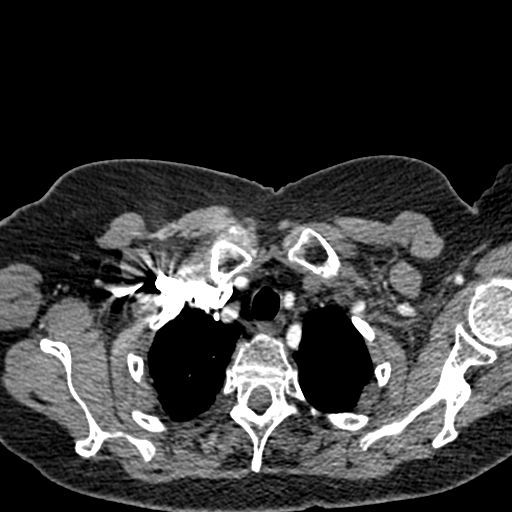
[im 251/287  lung]
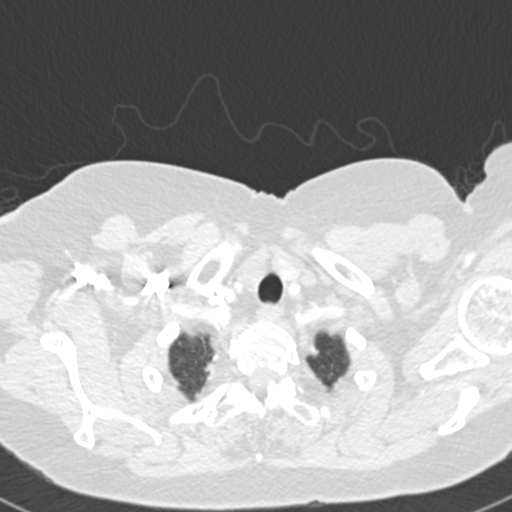
[im 275/287  soft-tissue]
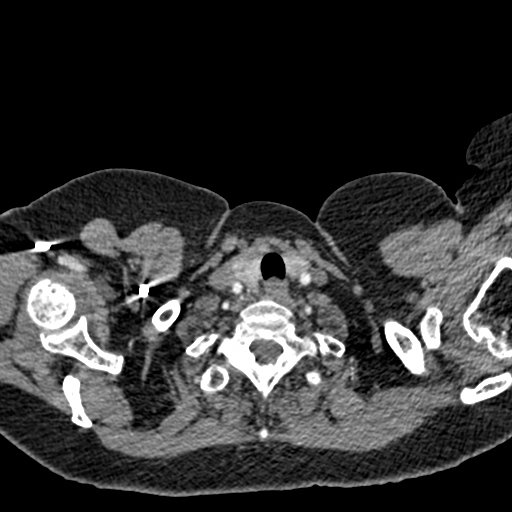

[Series 7: coronal mpr · coronal · 0.49mm/px · 3 of 100 slices shown]
[im 25/100  soft-tissue]
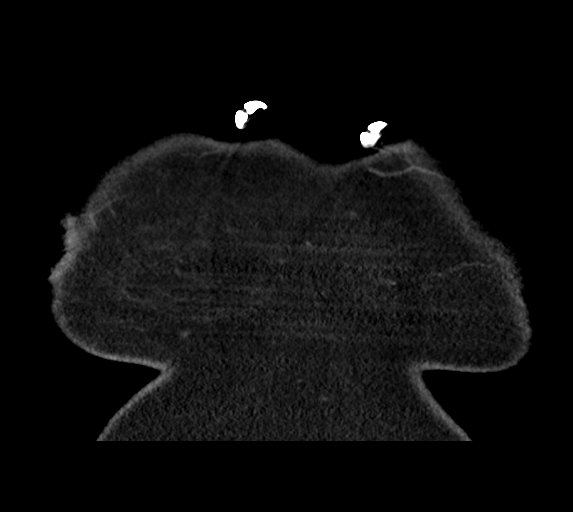
[im 50/100  soft-tissue]
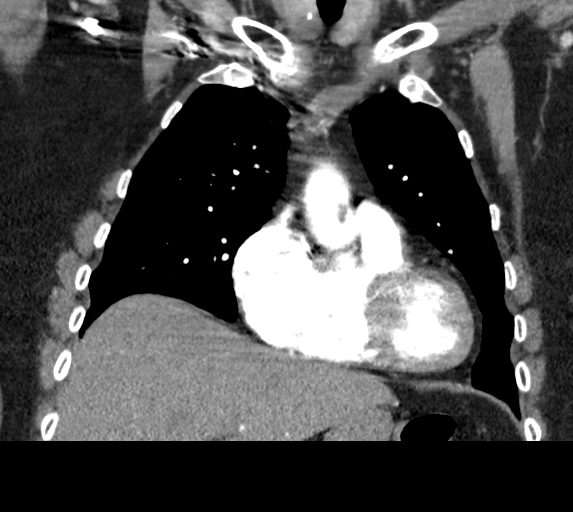
[im 75/100  soft-tissue]
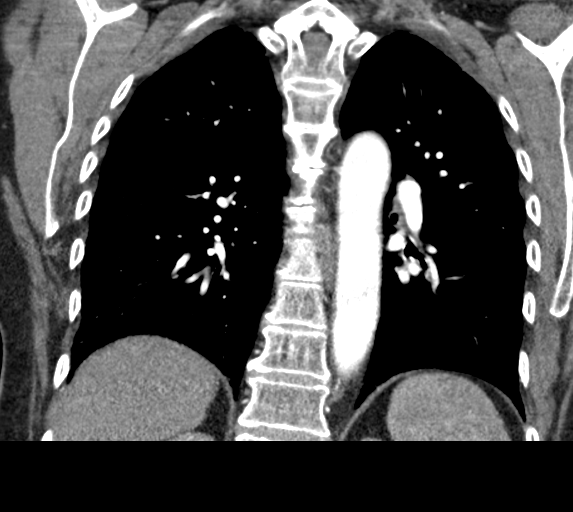

[19 of 46 positions shown; findings below may reference images not displayed]

FINDINGS: Cardiovascular: Satisfactory opacification of the pulmonary arteries
to the segmental level. No evidence of pulmonary embolism. Normal
heart size. No pericardial effusion. There is mild coronary artery
calcium. Minimal thoracic aortic calcified atherosclerotic disease.

Mediastinum/Nodes: No enlarged mediastinal, hilar, or axillary lymph
nodes. The thyroid gland and trachea demonstrate no significant
findings.

Lungs/Pleura: There are bibasilar hypoventilatory changes. There is
no focal airspace consolidation. There is no pleural effusion or
pneumothorax. Airways are patent.

Upper Abdomen: Small hiatal hernia.

Musculoskeletal: There is a chronic T7 compression deformity with
approximately 30% height loss, as reported on prior chest radiograph

Review of the MIP images confirms the above findings.
IMPRESSION: No pulmonary embolism or other acute findings in the chest.

Chronic T7 compression deformity with approximately 30% height loss.

Aortic Atherosclerosis ([FD]-[FD]).

## 2021-04-04 MED ORDER — MORPHINE SULFATE (PF) 4 MG/ML IV SOLN: 4.0000 mg | INTRAVENOUS | Status: DC | PRN

## 2021-04-04 MED ORDER — ACETAMINOPHEN 325 MG PO TABS
650.0000 mg | ORAL_TABLET | Freq: Once | ORAL | Status: AC
  Administered 2021-04-04: 650 mg via ORAL
  Filled 2021-04-04: qty 2

## 2021-04-04 MED ORDER — ONDANSETRON HCL 4 MG/2ML IJ SOLN: 4.0000 mg | Freq: Once | INTRAMUSCULAR | Status: DC

## 2021-04-04 MED ORDER — IOHEXOL 350 MG/ML SOLN
100.0000 mL | Freq: Once | INTRAVENOUS | Status: AC | PRN
  Administered 2021-04-04: 75 mL via INTRAVENOUS

## 2021-04-04 NOTE — ED Notes (Signed)
Pt assisted to restroom, steady gait

## 2021-04-04 NOTE — ED Notes (Signed)
See triage note, pt reports having cp today while at pcp with shob. Denies n/v. Pt in NAD.  Speaking in complete sentences. Alert and oriented

## 2021-04-04 NOTE — ED Notes (Signed)
Pt ambulatory to toilet

## 2021-04-04 NOTE — ED Notes (Signed)
Pt provided warm blanket as requested 

## 2021-04-04 NOTE — ED Triage Notes (Addendum)
Pt to ER with complaints of left sided non-radiating chest pain, sharp in nature associated with shortness of breath. Pt reports she was waiting to see Dr Roland Rack for a follow up appointment when she suddenly felt bad. Hx of afib, reports using metoprolol. No blood thinner usage. Reports having covid two months ago.

## 2021-04-04 NOTE — ED Provider Notes (Signed)
[facility] Provider Note    Event Date/Time   First MD Initiated Contact with Patient 04/04/21 1146     (approximate)  I have reviewed the triage vital signs and the nursing notes.   HISTORY  Chief Complaint Chest Pain    HPI Suzanne Escobar is a 75 y.o. female below listed past medical history presents to the ER for midsternal chest pain and pressure that started while the patient was waiting for orthopedic follow-up appointment today.  Denies any diaphoresis.  No shortness of breath.  No fevers.  Has had lower extremity swelling symptoms recent orthopedic surgery.  No history of DVT she is not on anticoagulation.  Did have COVID several months ago.  Does have a history of A. fib and is on metoprolol no history of stents or ACS  Past Medical History:  Diagnosis Date   Arthritis    Dysrhythmia    Fibromyalgia    Heart murmur    MVP   No family history on file. 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   Complex tear of medial meniscus of right knee as current injury 03/04/2020   Chronic pain of both knees 09/19/2019   Insomnia 02/27/2019   Lumbar degenerative disc disease 08/09/2018   Anxiety 01/19/2018   Controlled substance agreement signed 01/19/2018   H/O bilateral hip replacements 07/25/2535   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 Authorizing Provider  calcium carbonate (TUMS - DOSED IN MG ELEMENTAL CALCIUM) 500 MG chewable tablet Chew 500 mg by mouth daily as needed for indigestion or heartburn.    [provider]  Dietary Management Product (VB6 P5P PO) Take 1 capsule by mouth daily.    [provider]  diphenhydrAMINE (BENADRYL) 25 MG tablet Take 25 mg by mouth every 6 (six) hours as needed for allergies.    [provider]  docusate sodium (COLACE) 100 MG capsule Take 100 mg by mouth 2 (two) times daily.    [provider]  gabapentin (NEURONTIN) 300 MG capsule Take 300 mg by mouth 2 (two) times daily.    [provider]  ibuprofen (ADVIL) 200 MG tablet Take 400 mg by mouth every 8 (eight) hours as needed for moderate pain.    [provider]  Melatonin 1 MG CAPS Take 1 mg by mouth at bedtime.    [provider]  metoprolol succinate (TOPROL-XL) 25 MG 24 hr tablet Take 25 mg by mouth every evening. 08/24/20  [provider]  oxyCODONE (ROXICODONE) 5 MG immediate release tablet Take 1-2 tablets (5-10 mg total) by mouth every 4 (four) hours as needed for severe pain. 11/18/20   Hooten, Laurice Record, MD  RESTASIS 0.05 % ophthalmic emulsion Place 1 drop into both eyes 2 (two) times daily. 10/23/20   [provider]  rosuvastatin (CRESTOR) 5 MG tablet Take 5 mg by mouth daily. 07/05/20   [provider]  traZODone (DESYREL) 50 MG tablet Take 50 mg by mouth at bedtime as needed for sleep. 07/03/20   [provider]  Vitamin D-Vitamin K (K2 PLUS D3 PO) Take 1 tablet by mouth daily.     [provider]    Allergies Hydrocodone-acetaminophen    Social History Social History   Tobacco Use   Smoking status: Never   Smokeless tobacco: Never  Vaping Use   Vaping Use: Never used  Substance Use Topics   Alcohol use: Yes    Comment: occas   Drug use: Never    Review of Systems Patient denies headaches, rhinorrhea, blurry vision, numbness, shortness of breath, chest pain, edema, cough, abdominal pain, nausea, vomiting, diarrhea, dysuria, fevers, rashes or hallucinations unless otherwise stated above in HPI. ____________________________________________   PHYSICAL EXAM:  VITAL SIGNS: Vitals:   04/04/21 1128  BP: (!) 144/68  Pulse: 86  Resp: 16  Temp: 98.2 F (36.8 C)  SpO2: 99%    Constitutional: Alert and oriented.  Eyes: Conjunctivae are normal.  Head: Atraumatic. Nose: No congestion/rhinnorhea. Mouth/Throat: Mucous membranes are moist.   Neck: No stridor. Painless ROM.  Cardiovascular: Normal rate, regular rhythm. Grossly normal heart sounds.  Good peripheral circulation. Respiratory: Normal respiratory effort.  No retractions. Lungs CTAB. Gastrointestinal: Soft and nontender. No distention. No abdominal bruits. No CVA tenderness. Genitourinary:  Musculoskeletal: No lower extremity tenderness, l>R LE edema.  No joint effusions. Neurologic:  Normal speech and language. No gross focal neurologic deficits are appreciated. No facial droop Skin:  Skin is warm, dry and intact. No rash noted. Psychiatric: Mood and affect are normal. Speech and behavior are normal.  ____________________________________________   LABS (all labs ordered are listed, but only abnormal results are displayed)  Results for orders placed or performed during the hospital encounter of 04/04/21 (from the past 24 hour(s))  Basic metabolic panel     Status: Abnormal   Collection Time: 04/04/21 11:29 AM  Result Value Ref Range   Sodium 138 135 - 145 mmol/L   Potassium 4.3  3.5 - 5.1 mmol/L   Chloride 107 98 - 111 mmol/L   CO2 25 22 - 32 mmol/L   Glucose, Bld 117 (H) 70 - 99 mg/dL   BUN 15 8 - 23 mg/dL   Creatinine, Ser 0.65 0.44 - 1.00 mg/dL   Calcium 8.8 (L) 8.9 - 10.3 mg/dL   GFR, Estimated >60 >60 mL/min   Anion gap 6 5 - 15  CBC     Status: Abnormal   Collection Time: 04/04/21 11:29 AM  Result Value Ref Range   WBC 6.0 4.0 - 10.5 K/uL   RBC 3.49 (L) 3.87 - 5.11 MIL/uL   Hemoglobin 11.1 (L) 12.0 - 15.0 g/dL   HCT 33.7 (L) 36.0 - 46.0 %   MCV 96.6 80.0 - 100.0 fL   MCH 31.8 26.0 - 34.0 pg   MCHC 32.9 30.0 - 36.0 g/dL   RDW 14.1 11.5 - 15.5 %   Platelets 240 150 - 400 K/uL   nRBC 0.0 0.0 - 0.2 %  Troponin I (High Sensitivity)     Status: None   Collection Time: 04/04/21 11:29 AM  Result Value Ref Range   Troponin I (High Sensitivity) 4 <18 ng/L  D-dimer, quantitative     Status: Abnormal   Collection Time: 04/04/21 11:56 AM  Result Value Ref Range   D-Dimer, Quant 0.53 (H) 0.00 - 0.50 ug/mL-FEU  Troponin I (High Sensitivity)     Status: None   Collection Time: 04/04/21  2:42 PM  Result Value Ref Range   Troponin I (High Sensitivity) 4 <18 ng/L   ____________________________________________  EKG My review and personal interpretation at Time: 11:22   Indication: chest pain  Rate: 90  Rhythm:  sinus Axis: normal Other: normal intervals, no stemi, no depressions ____________________________________________  RADIOLOGY  I personally reviewed all radiographic images ordered to evaluate for the above acute complaints and reviewed radiology reports and findings.  These findings were personally discussed with the patient.  Please see medical record for radiology report.  ____________________________________________   PROCEDURES  Procedure(s) performed:  Procedures    Critical Care performed: no ____________________________________________   INITIAL IMPRESSION / ASSESSMENT AND PLAN / ED COURSE  Pertinent labs & imaging results that  were available during my care of the patient were reviewed by me and considered in my medical decision making (see chart for details).   DDX: ACS, pericarditis, esophagitis, boerhaaves, pe, dissection, pna, bronchitis, costochondritis   Lamisha Roussell is a 75 y.o. who presents to the ED with presentation as described above.  Patient nontoxic-appearing.  Seems atypical for ACS but given age and risk factors will order serial enzymes.  Initial EKG nonischemic initial troponin negative.  Given recent surgery and COVID D-dimer was ordered which is elevated does have some mild lower extremity swelling left greater than right not consistent with infection.  Ultrasound does not show evidence of DVT.  CTA ordered to evaluate for PE does not show any evidence of PE.  Clinical Course as of 04/04/21 1518  Fri Apr 04, 2021  1514 Repeat cardiac enzyme negative.  She is pain-free.  Given reassuring work-up patient stable and appropriate for outpatient follow-up. [PR]    Clinical Course User Index [PR] Merlyn Lot, MD    The patient was evaluated in Emergency Department today for the symptoms described in the history of present illness. He/she was evaluated in the context of the global COVID-19 pandemic, which necessitated consideration that the patient might be at risk for infection with the SARS-CoV-2 virus that causes COVID-19. Institutional protocols and algorithms that pertain to the evaluation of patients at risk for COVID-19 are in a state of rapid change based on information released by regulatory bodies including the CDC and federal and state organizations. These policies and algorithms were followed during the patient's care in the ED.  As part of my medical decision making, I reviewed the following data within the [facility] notes reviewed and incorporated, Labs reviewed, notes from prior ED visits and  Controlled Substance  Database   ____________________________________________   FINAL CLINICAL IMPRESSION(S) / ED DIAGNOSES  Final diagnoses:  Atypical chest pain      NEW MEDICATIONS STARTED DURING THIS VISIT:  New Prescriptions   No medications on file     Note:  This document was prepared using Dragon voice recognition software and may include unintentional dictation errors.    Merlyn Lot, MD 04/04/21 [phone]

## 2021-04-14 ENCOUNTER — Other Ambulatory Visit: Payer: Self-pay | Admitting: Physical Medicine & Rehabilitation

## 2021-04-14 DIAGNOSIS — M542 Cervicalgia: Secondary | ICD-10-CM

## 2021-04-22 ENCOUNTER — Ambulatory Visit
Admission: RE | Admit: 2021-04-22 | Discharge: 2021-04-22 | Disposition: A | Discharge status: Home / Self Care | Payer: Medicare Other | Source: Ambulatory Visit | Attending: Physical Medicine & Rehabilitation | Admitting: Physical Medicine & Rehabilitation

## 2021-04-22 ENCOUNTER — Other Ambulatory Visit: Payer: Self-pay

## 2021-04-22 DIAGNOSIS — M542 Cervicalgia: Secondary | ICD-10-CM | POA: Diagnosis present

## 2021-04-22 IMAGING — MR MR CERVICAL SPINE W/O CM
5 series · 40 of 48 positions shown · non-contrast
Comparison: None.

CLINICAL DATA: History of cervical spine fusion x2. Fall twice in
[GZ]. Nonradiating right shoulder pain

EXAM:
MRI CERVICAL SPINE WITHOUT CONTRAST
TECHNIQUE: Multiplanar, multisequence MR imaging of the cervical spine was
performed. No intravenous contrast was administered.

[Series 5: T2 · sagittal · 3.0mm · 0.62mm/px · 7 of 15 slices shown (1 of 2)]
[im 1/15]
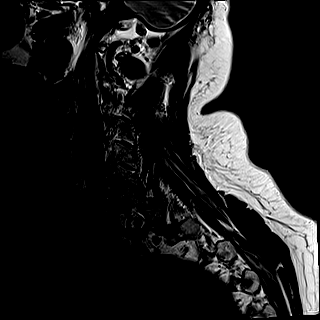
[im 3/15]
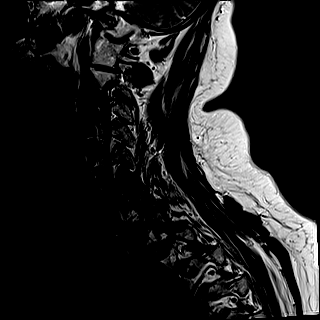
[im 5/15]
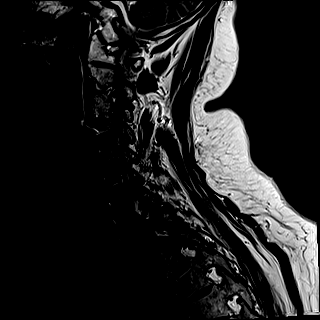
[im 8/15]
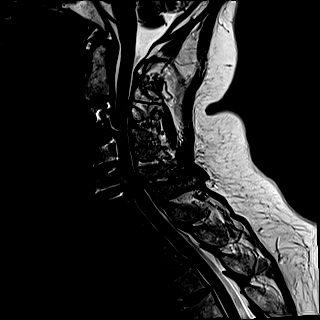
[im 10/15]
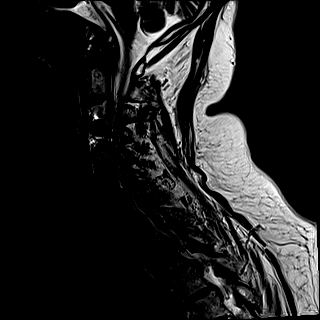
[im 12/15]
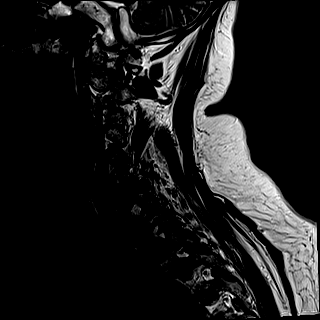
[im 15/15]
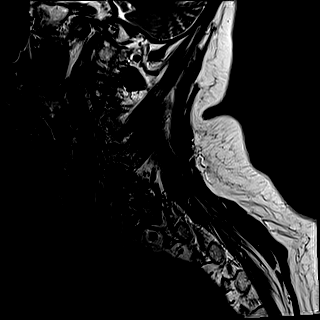

[Series 6: FLAIR · sagittal · 3.0mm · 0.78mm/px · 7 of 15 slices shown]
[im 1/15]
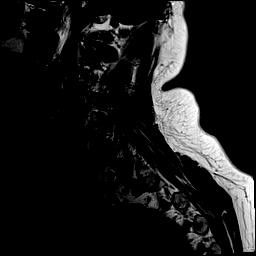
[im 3/15]
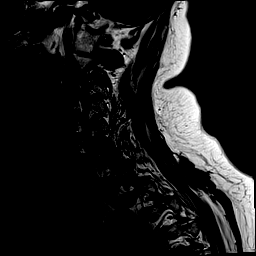
[im 5/15]
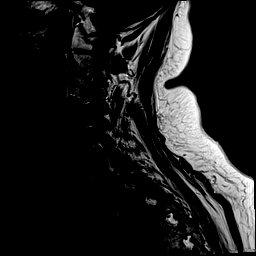
[im 8/15]
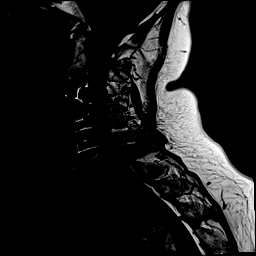
[im 10/15]
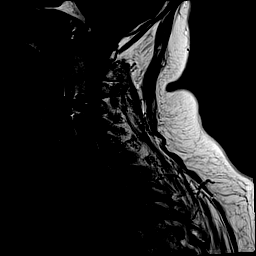
[im 12/15]
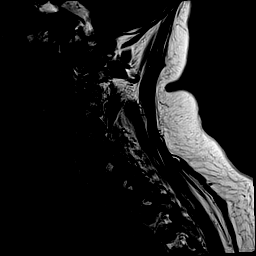
[im 15/15]
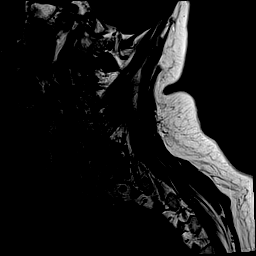

[Series 7: STIR · sagittal · 3.0mm · 0.62mm/px · 6 of 15 slices shown]
[im 1/15]
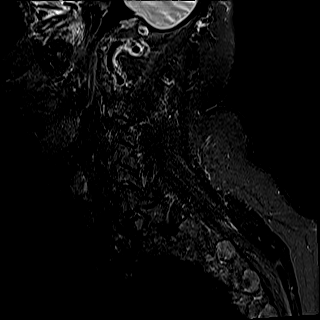
[im 3/15]
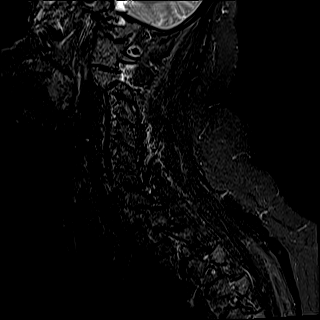
[im 6/15]
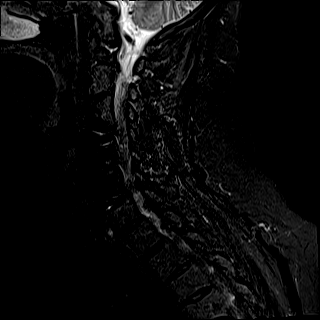
[im 9/15]
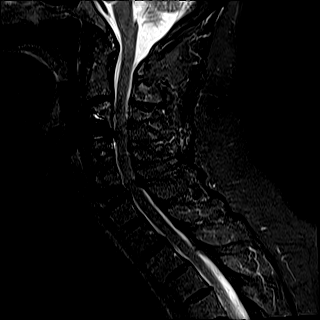
[im 12/15]
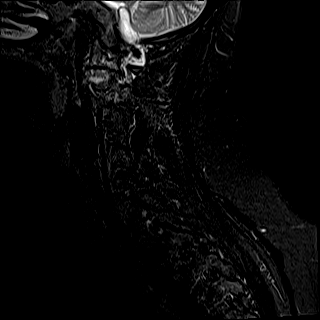
[im 15/15]
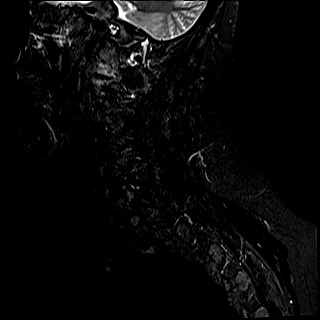

[Series 8: T2 · axial · 3.0mm · 0.70mm/px · z∈[-144,-38]mm · 12 of 33 slices shown (2 of 2)]
[im 1/33]
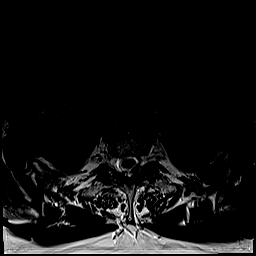
[im 3/33]
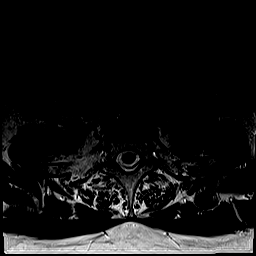
[im 5/33]
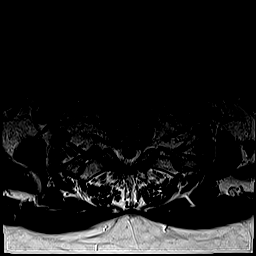
[im 8/33]
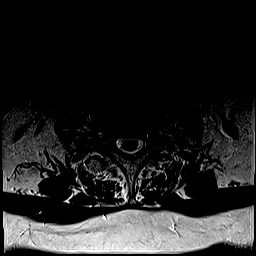
[im 10/33]
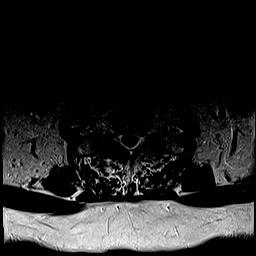
[im 13/33]
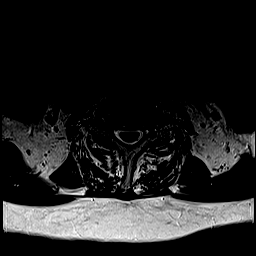
[im 15/33]
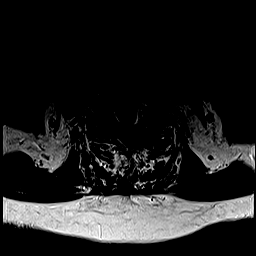
[im 18/33]
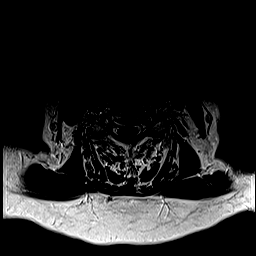
[im 20/33]
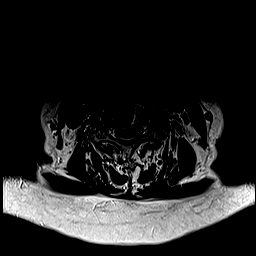
[im 23/33]
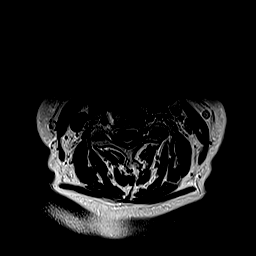
[im 28/33]
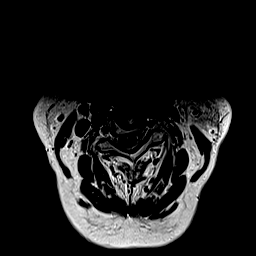
[im 33/33]
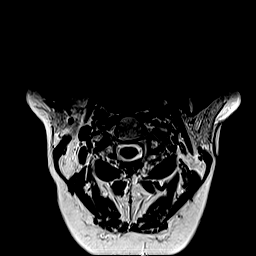

[Series 9: ax mpgr · axial · 3.0mm · 0.35mm/px · z∈[-144,-38]mm · 8 of 33 slices shown]
[im 1/33]
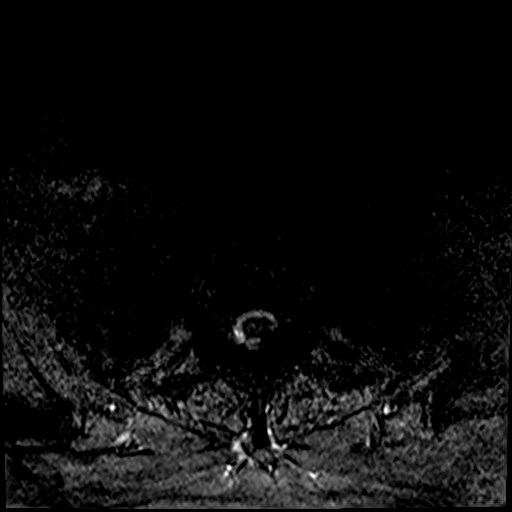
[im 5/33]
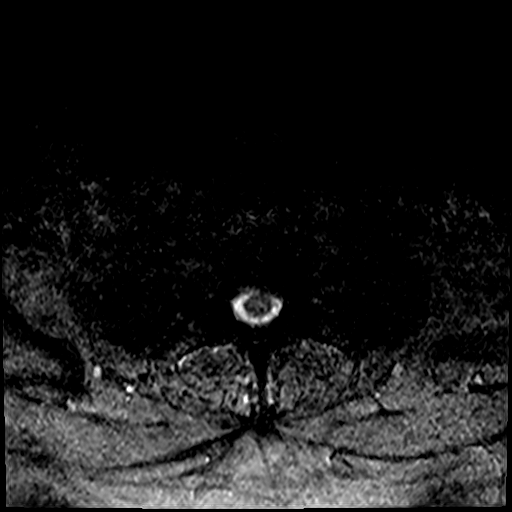
[im 10/33]
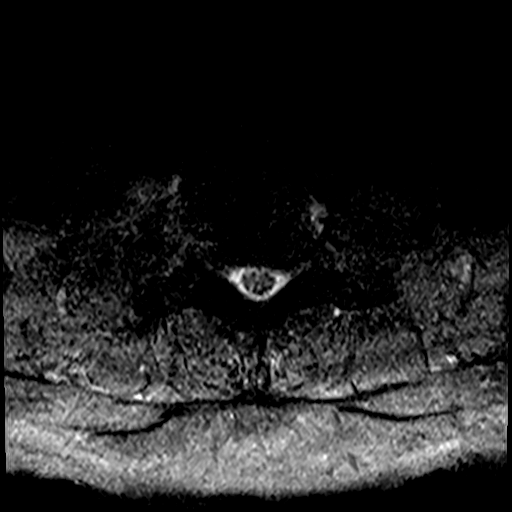
[im 15/33]
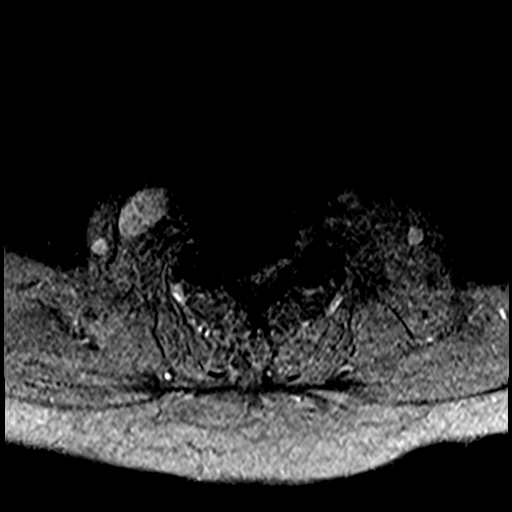
[im 18/33]
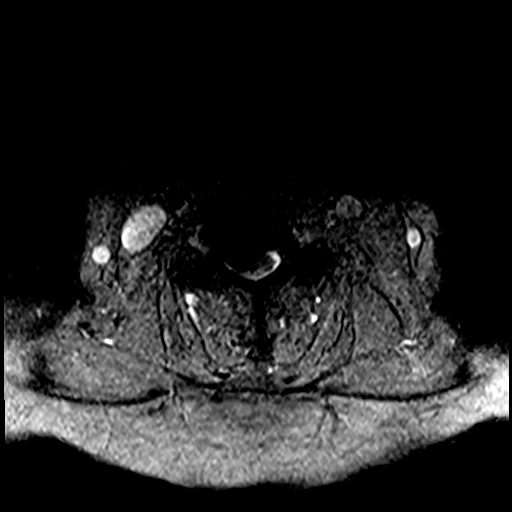
[im 23/33]
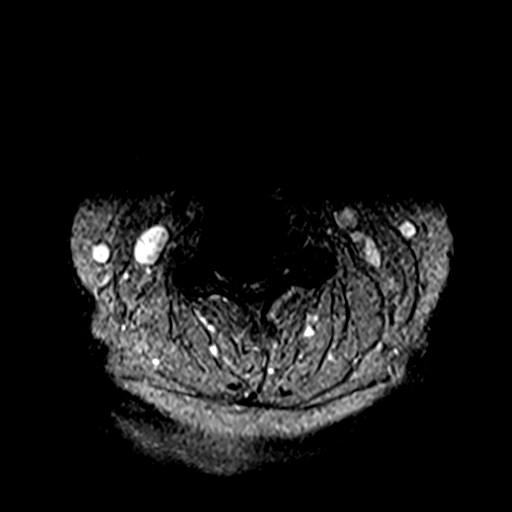
[im 28/33]
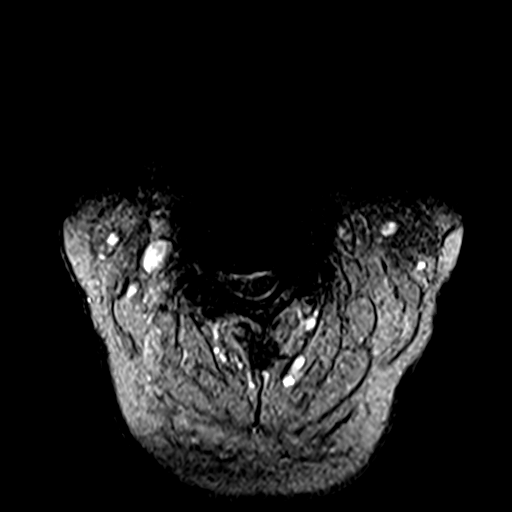
[im 33/33]
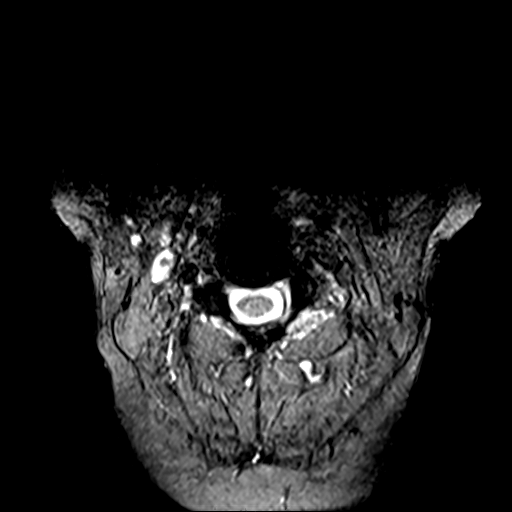

[40 of 48 positions shown; findings below may reference images not displayed]

FINDINGS: Alignment: Slight C7-T1 to T2-3 anterolisthesis.

Vertebrae: No fracture, evidence of discitis, or bone lesion. C3-C6
ACDF with solid arthrodesis.

Cord: Normal

Posterior Fossa, vertebral arteries, paraspinal tissues: Negative

Disc levels:

C2-3: Minimal facet spurring

C3-4: ACDF with solid arthrodesis.  No impingement

C4-5: ACDF with solid arthrodesis.  No visible impingement

C5-6: ACDF with solid arthrodesis and no impingement

C6-7: Adjacent segment disc degeneration with narrowing and
circumferential bulging. Left more than right facet osteoarthritis.
Spinal stenosis mildly flattening the cord with a triangular
morphology. Moderate biforaminal impingement

C7-T1:Facet spurring and mild anterolisthesis.
IMPRESSION: 1. C6-7 adjacent segment degeneration causing spinal stenosis with
mild cord deformity and moderate bilateral foraminal impingement.
2. C3-C6 ACDF with solid arthrodesis.

## 2021-04-23 ENCOUNTER — Ambulatory Visit
Admission: RE | Admit: 2021-04-23 | Discharge: 2021-04-23 | Disposition: A | Discharge status: Home / Self Care | Payer: Medicare Other | Source: Ambulatory Visit | Attending: Internal Medicine | Admitting: Internal Medicine

## 2021-04-23 DIAGNOSIS — Z1231 Encounter for screening mammogram for malignant neoplasm of breast: Secondary | ICD-10-CM | POA: Diagnosis present

## 2021-04-23 HISTORY — DX: Malignant (primary) neoplasm, unspecified: C80.1

## 2021-04-23 IMAGING — MG MM DIGITAL SCREENING BILAT W/ TOMO AND CAD
8 series · 8 of 24 positions shown · non-contrast
Comparison: Previous exam(s).

CLINICAL DATA: Screening.

EXAM:
DIGITAL SCREENING BILATERAL MAMMOGRAM WITH TOMOSYNTHESIS AND CAD
TECHNIQUE: Bilateral screening digital craniocaudal and mediolateral oblique
mammograms were obtained. Bilateral screening digital breast
tomosynthesis was performed. The images were evaluated with
computer-aided detection.

[R MLO synth-2D]
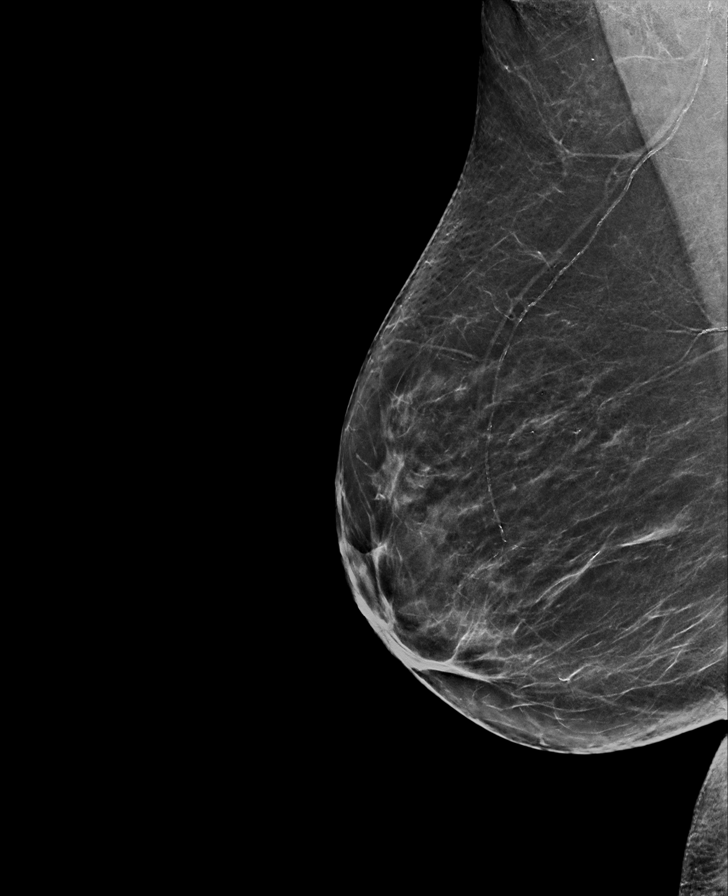

[L MLO synth-2D]
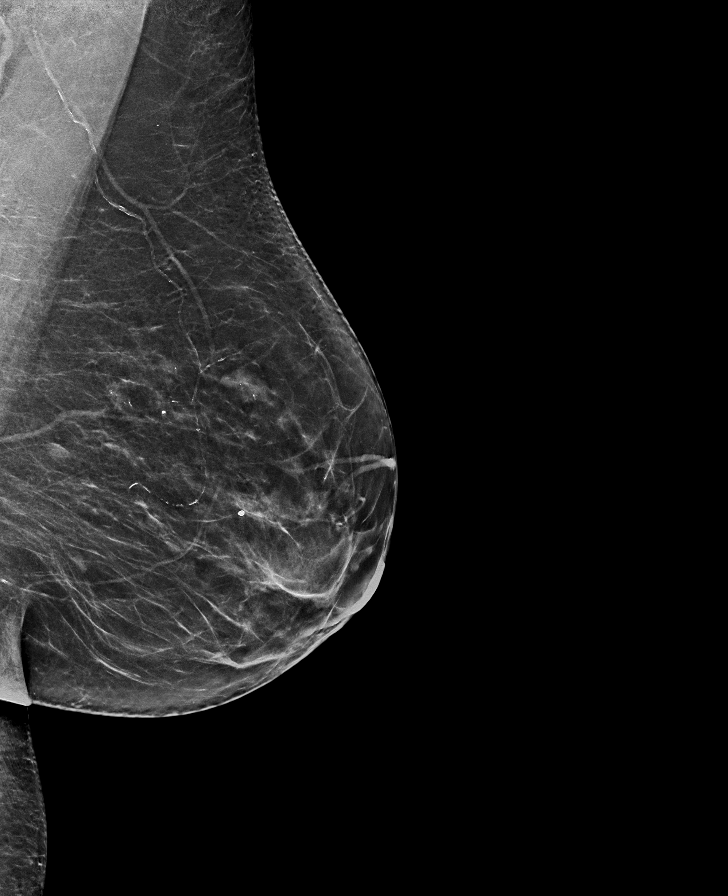

[L CC synth-2D]
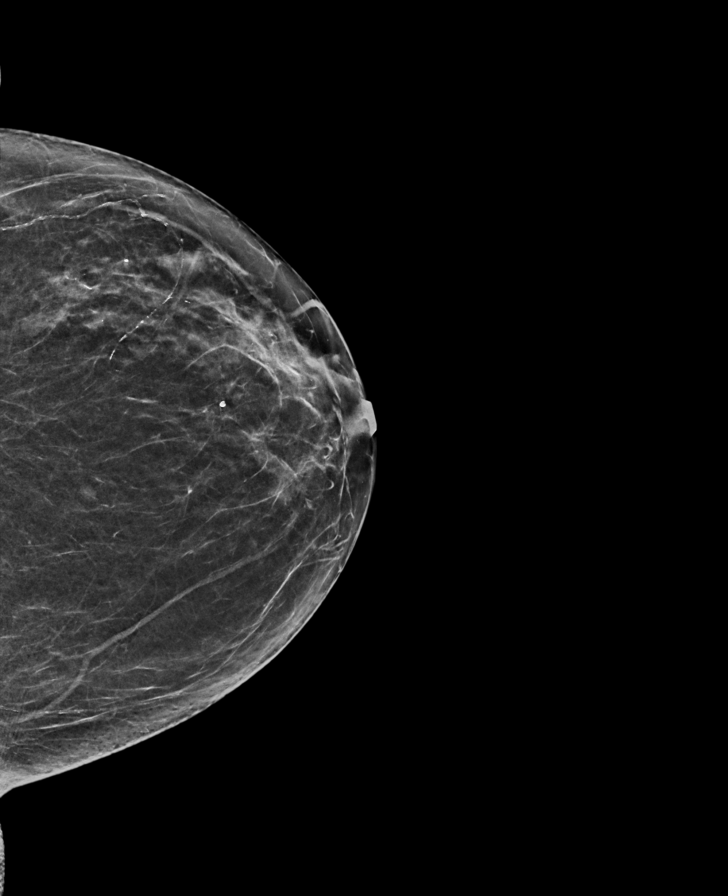

[R CC synth-2D]
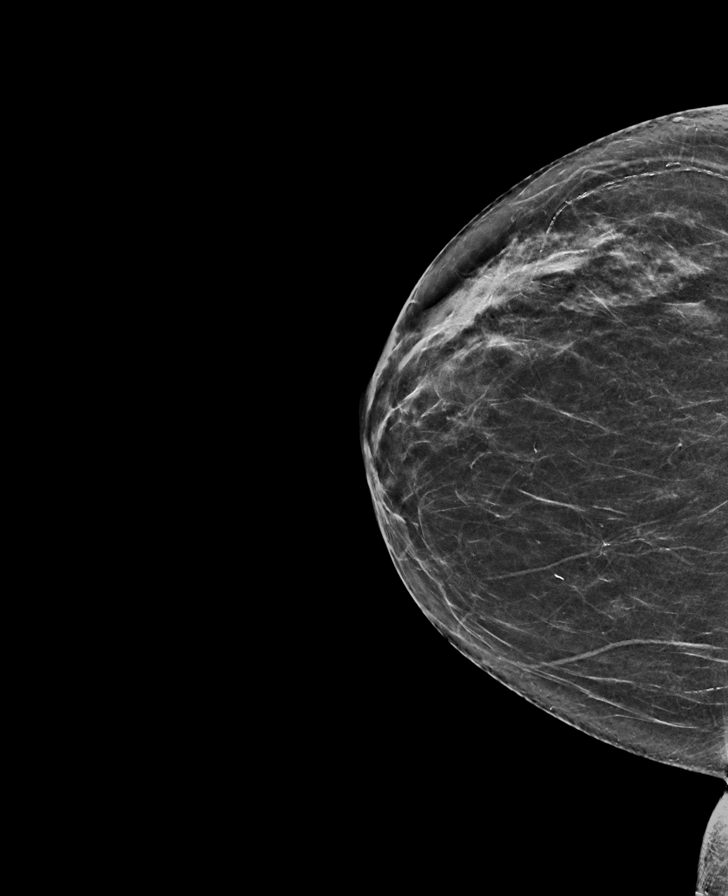

[L MLO tomo · tomo slice 35/69.0]
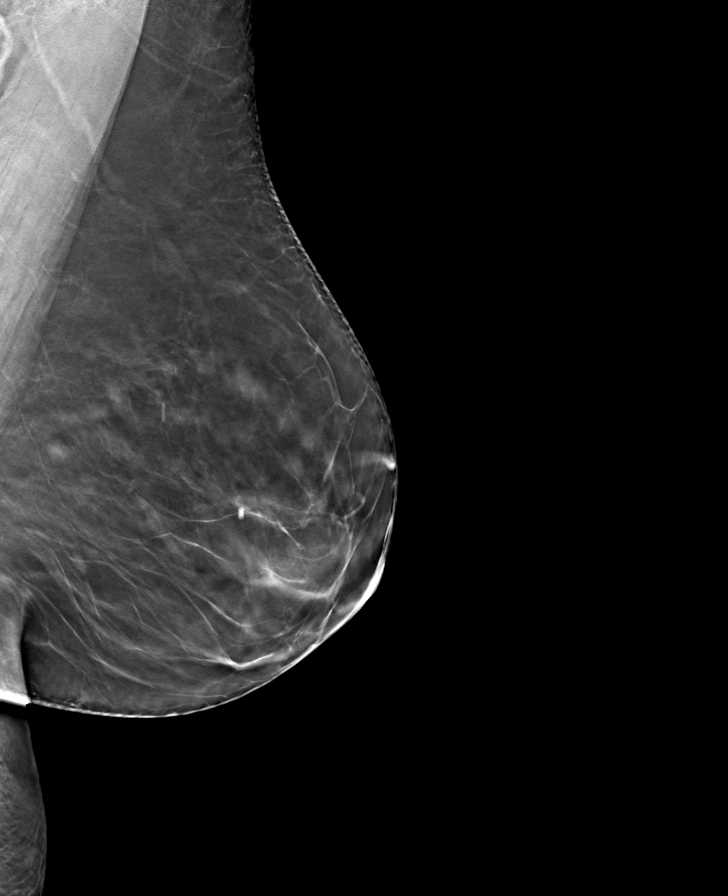

[R CC tomo · tomo slice 31/60.0]
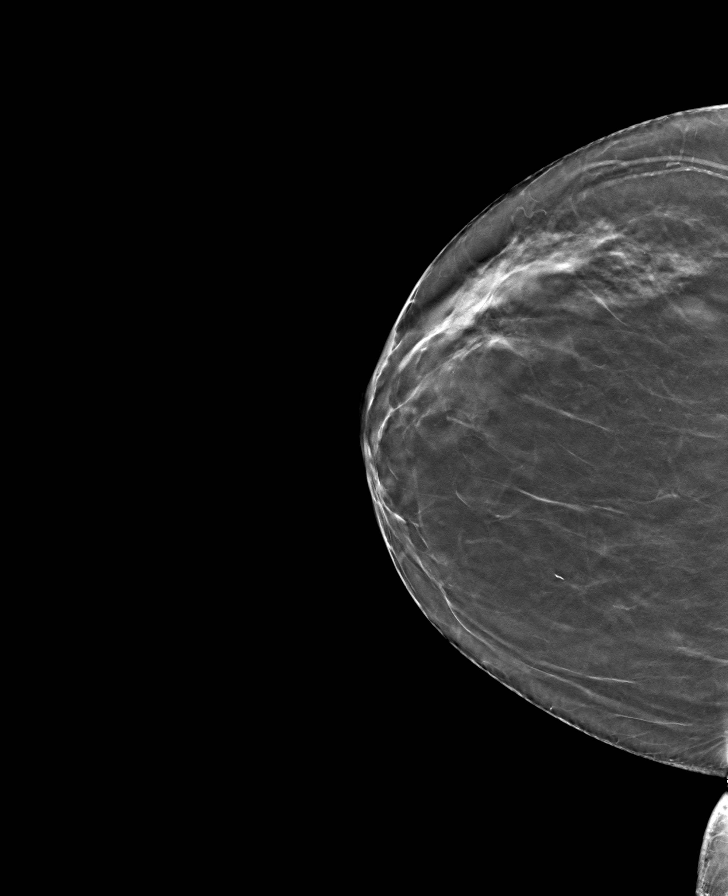

[R MLO tomo · tomo slice 37/72.0]
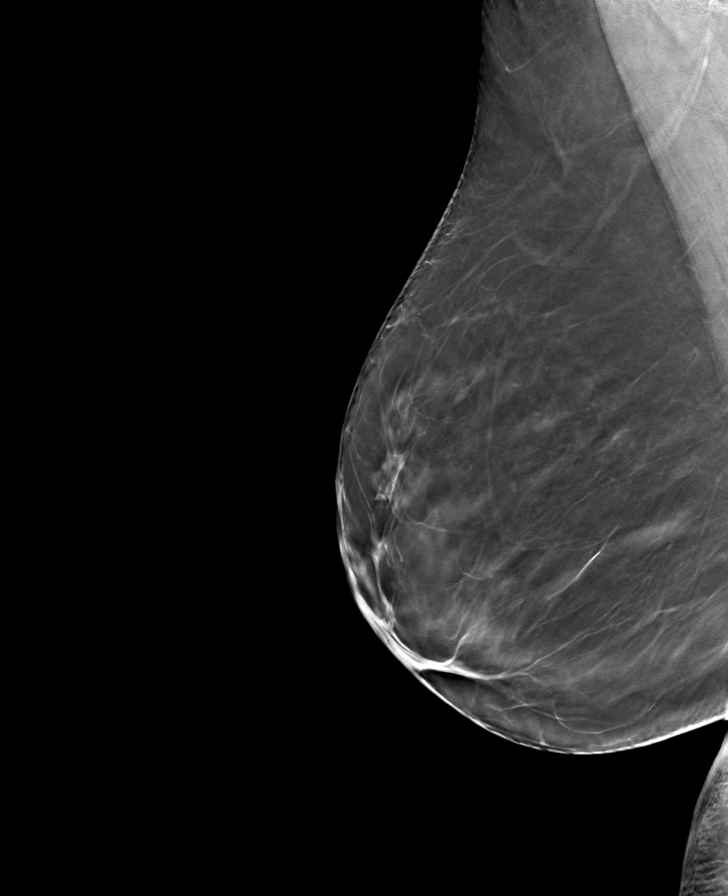

[L CC tomo · tomo slice 30/59.0]
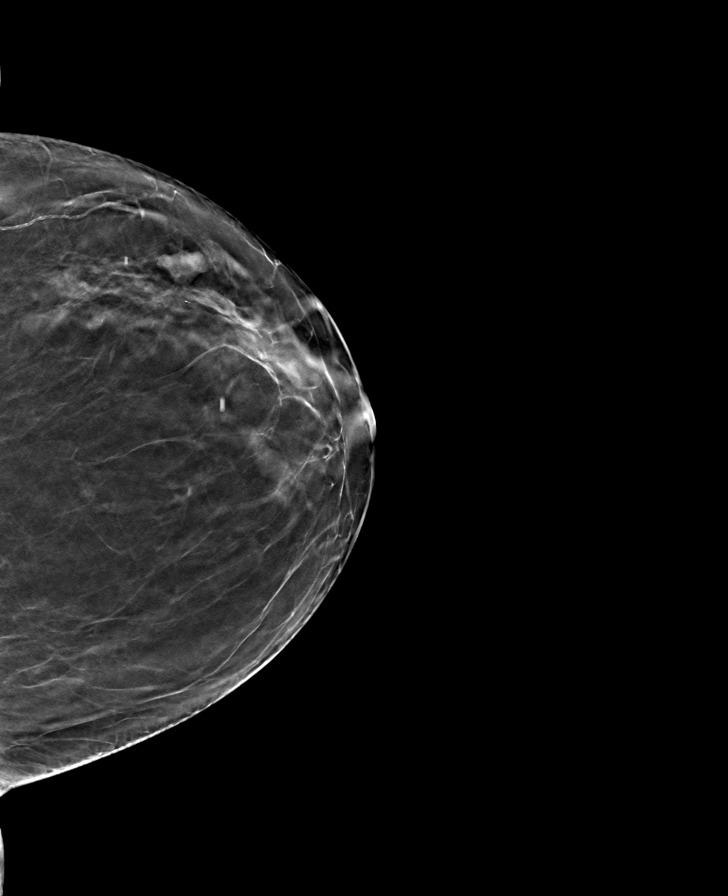

[8 of 24 positions shown; findings below may reference images not displayed]

ACR Breast Density Category b: There are scattered areas of
fibroglandular density.
FINDINGS: There are no findings suspicious for malignancy.
IMPRESSION: No mammographic evidence of malignancy. A result letter of this
screening mammogram will be mailed directly to the patient.

RECOMMENDATION:
Screening mammogram in one year. (Code:[BY])

BI-RADS CATEGORY  1: Negative.

## 2021-04-24 ENCOUNTER — Inpatient Hospital Stay
Admission: RE | Admit: 2021-04-24 | Discharge: 2021-04-24 | Disposition: A | Discharge status: Home / Self Care | Payer: Self-pay | Source: Ambulatory Visit | Attending: *Deleted | Admitting: *Deleted

## 2021-04-24 ENCOUNTER — Other Ambulatory Visit: Payer: Self-pay | Admitting: *Deleted

## 2021-04-24 DIAGNOSIS — Z1231 Encounter for screening mammogram for malignant neoplasm of breast: Secondary | ICD-10-CM

## 2021-04-29 ENCOUNTER — Ambulatory Visit
Admission: RE | Admit: 2021-04-29 | Discharge: 2021-04-29 | Disposition: A | Discharge status: Home / Self Care | Payer: Medicare Other | Source: Ambulatory Visit | Attending: Physical Medicine & Rehabilitation | Admitting: Physical Medicine & Rehabilitation

## 2021-04-29 ENCOUNTER — Other Ambulatory Visit: Payer: Self-pay

## 2021-04-29 DIAGNOSIS — M5412 Radiculopathy, cervical region: Secondary | ICD-10-CM

## 2021-04-29 IMAGING — XA DG INJECT/[PERSON_NAME] INC NEEDLE/CATH/PLC EPI/CERV/THOR W/IMG
2 series · 2 of 2 positions shown · non-contrast
Comparison: none

CLINICAL DATA: Cervical spondylosis without myelopathy. Excellent
response to the epidural injection in [DATE]. Recurrent left
greater than right neck, shoulder, and arm pain.

[Series 1: ortho adipose · 1 of 1 slices shown (1 of 2)]
[im 1/1]
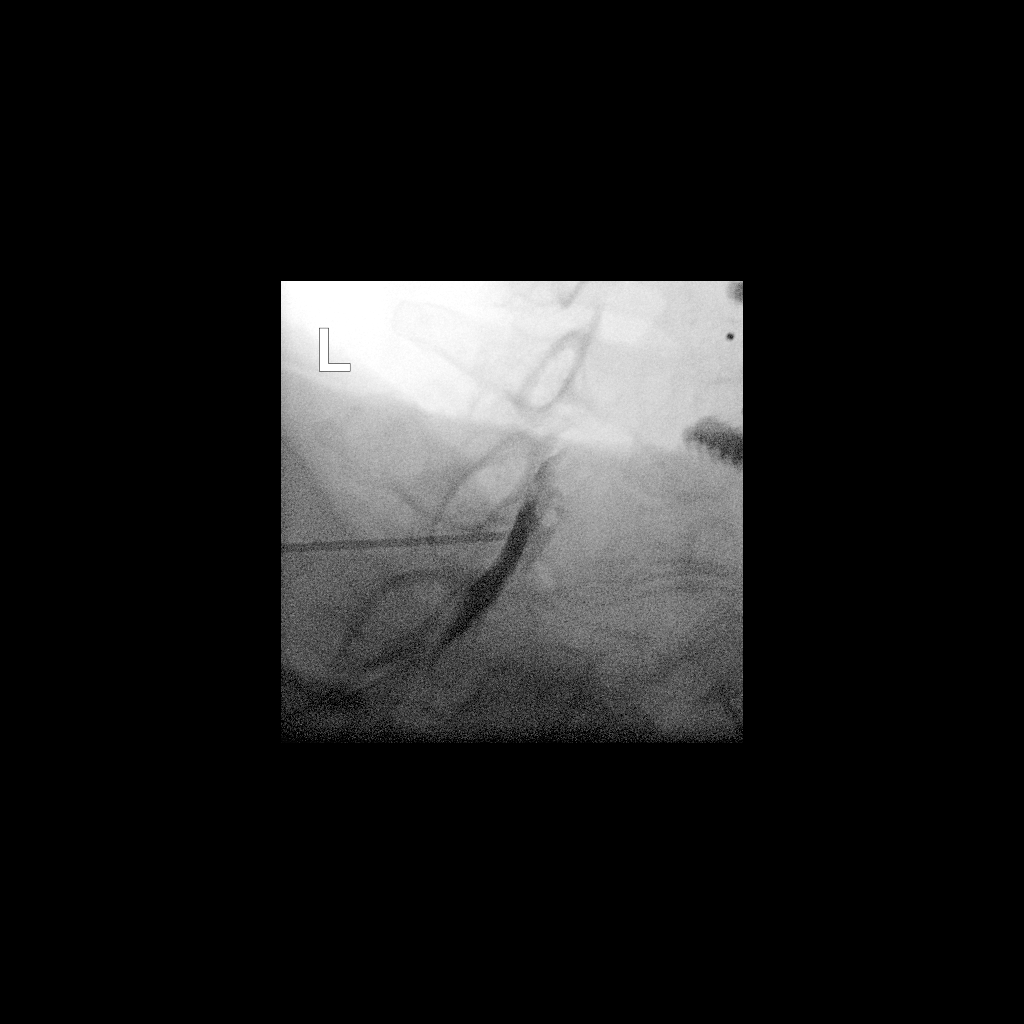

[Series 2: ortho adipose · 1 of 1 slices shown (2 of 2)]
[im 1/1]
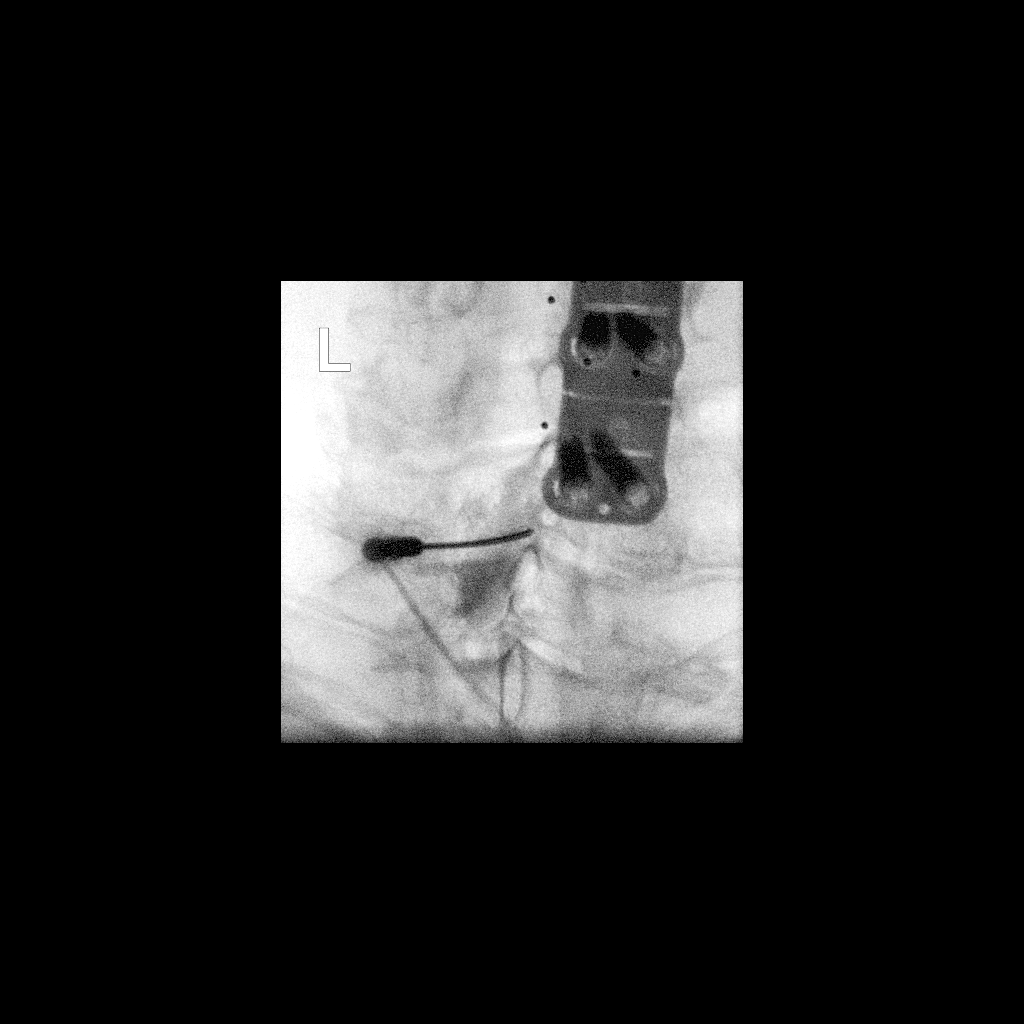

[2 of 2 positions shown; findings below may reference images not displayed]

FLUOROSCOPY TIME:  Fluoroscopy Time: 23 seconds

Radiation Exposure Index: 8.93 microGray*m^2

PROCEDURE:
The procedure, risks, benefits, and alternatives were explained to
the patient. Questions regarding the procedure were encouraged and
answered. The patient understands and consents to the procedure.

CERVICAL EPIDURAL INJECTION

An interlaminar approach was performed on the left at C7-T1. A
inch 20 gauge epidural needle was advanced using loss-of-resistance
technique.

DIAGNOSTIC EPIDURAL INJECTION

Injection of Isovue-M 300 shows a good epidural pattern with spread
above and below the level of needle placement, primarily on the
left. No vascular opacification is seen. THERAPEUTIC

EPIDURAL INJECTION

1.5 ml of Kenalog 40 mixed with 2 ml of normal saline were then
instilled. The procedure was well-tolerated, and the patient was
discharged thirty minutes following the injection in good condition.
IMPRESSION: Technically successful interlaminar epidural injection on the left
at C7-T1.

## 2021-04-29 MED ORDER — IOPAMIDOL (ISOVUE-M 300) INJECTION 61%
1.0000 mL | Freq: Once | INTRAMUSCULAR | Status: AC
  Administered 2021-04-29: 1 mL via EPIDURAL

## 2021-04-29 MED ORDER — TRIAMCINOLONE ACETONIDE 40 MG/ML IJ SUSP (RADIOLOGY)
60.0000 mg | Freq: Once | INTRAMUSCULAR | Status: AC
  Administered 2021-04-29: 60 mg via EPIDURAL

## 2021-04-29 NOTE — Discharge Instructions (Signed)

## 2021-11-13 ENCOUNTER — Other Ambulatory Visit: Payer: Self-pay | Admitting: Neurosurgery

## 2021-11-13 DIAGNOSIS — Z01818 Encounter for other preprocedural examination: Secondary | ICD-10-CM

## 2021-11-19 ENCOUNTER — Other Ambulatory Visit: Payer: Self-pay

## 2021-11-19 ENCOUNTER — Encounter
Admission: RE | Admit: 2021-11-19 | Discharge: 2021-11-19 | Disposition: A | Discharge status: Home / Self Care | Payer: Medicare Other | Source: Ambulatory Visit | Attending: Neurosurgery | Admitting: Neurosurgery

## 2021-11-19 DIAGNOSIS — Z01818 Encounter for other preprocedural examination: Secondary | ICD-10-CM | POA: Insufficient documentation

## 2021-11-19 HISTORY — DX: Other complications of anesthesia, initial encounter: T88.59XA

## 2021-11-19 HISTORY — DX: Nausea with vomiting, unspecified: R11.2

## 2021-11-19 HISTORY — DX: Personal history of other diseases of the digestive system: Z87.19

## 2021-11-19 HISTORY — DX: Atherosclerotic heart disease of native coronary artery without angina pectoris: I25.10

## 2021-11-19 HISTORY — DX: Anemia, unspecified: D64.9

## 2021-11-19 HISTORY — DX: Gastro-esophageal reflux disease without esophagitis: K21.9

## 2021-11-19 HISTORY — DX: Other specified postprocedural states: Z98.890

## 2021-11-19 LAB — PROTIME-INR
INR: 0.9 (ref 0.8–1.2)
Prothrombin Time: 12.3 seconds (ref 11.4–15.2)

## 2021-11-19 LAB — URINALYSIS, ROUTINE W REFLEX MICROSCOPIC
Bilirubin Urine: NEGATIVE
Glucose, UA: NEGATIVE mg/dL
Hgb urine dipstick: NEGATIVE
Ketones, ur: NEGATIVE mg/dL
Leukocytes,Ua: NEGATIVE
Nitrite: NEGATIVE
Protein, ur: NEGATIVE mg/dL
Specific Gravity, Urine: 1.01 (ref 1.005–1.030)
pH: 6 (ref 5.0–8.0)

## 2021-11-19 LAB — TYPE AND SCREEN
ABO/RH(D): O POS
Antibody Screen: NEGATIVE

## 2021-11-19 LAB — SURGICAL PCR SCREEN
MRSA, PCR: NEGATIVE
Staphylococcus aureus: NEGATIVE

## 2021-11-19 LAB — APTT: aPTT: 28 seconds (ref 24–36)

## 2021-11-19 NOTE — Patient Instructions (Addendum)
Your procedure is scheduled on: 12/03/21 - Wednesday Report to the Registration Desk on the 1st floor of the [facility]. To find out your arrival time, please call [phone] between 1PM - 3PM on: 12/02/21 - Tuesday   REMEMBER: Instructions that are not followed completely may result in serious medical risk, up to and including death; or upon the discretion of your surgeon and anesthesiologist your surgery may need to be rescheduled.  Do not eat food after midnight the night before surgery.  No gum chewing, lozengers or hard candies.  You may however, drink CLEAR liquids up to 2 hours before you are scheduled to arrive for your surgery. Do not drink anything within 2 hours of your scheduled arrival time.  Clear liquids include: - water  - apple juice without pulp - gatorade (not RED colors) - black coffee or tea (Do NOT add milk or creamers to the coffee or tea) Do NOT drink anything that is not on this list.  TAKE THESE MEDICATIONS THE MORNING OF SURGERY WITH A SIP OF WATER:  - gabapentin (NEURONTIN) 600 MG capsule - RESTASIS 0.05 % ophthalmic emulsion - celecoxib (CELEBREX) 50 MG capsule  One week prior to surgery: Stop Anti-inflammatories (NSAIDS) such as Advil, Aleve, Ibuprofen, Motrin, Naproxen, Naprosyn and Aspirin based products such as Excedrin, Goodys Powder, BC Powder.  Stop ANY OVER THE COUNTER supplements until after surgery.  You may however, continue to take Tylenol if needed for pain up until the day of surgery.  No Alcohol for 24 hours before or after surgery.  No Smoking including e-cigarettes for 24 hours prior to surgery.  No chewable tobacco products for at least 6 hours prior to surgery.  No nicotine patches on the day of surgery.  Do not use any "recreational" drugs for at least a week prior to your surgery.  Please be advised that the combination of cocaine and anesthesia may have negative outcomes, up to and including death. If you test positive  for cocaine, your surgery will be cancelled.  On the morning of surgery brush your teeth with toothpaste and water, you may rinse your mouth with mouthwash if you wish. Do not swallow any toothpaste or mouthwash.  Use CHG Soap or wipes as directed on instruction sheet.  Do not wear jewelry, make-up, hairpins, clips or nail polish.  Do not wear lotions, powders, or perfumes.   Do not shave body from the neck down 48 hours prior to surgery just in case you cut yourself which could leave a site for infection.  Also, freshly shaved skin may become irritated if using the CHG soap.  Contact lenses, hearing aids and dentures may not be worn into surgery.  Do not bring valuables to the hospital. [facility] is not responsible for any missing/lost belongings or valuables.   Notify your doctor if there is any change in your medical condition (cold, fever, infection).  Wear comfortable clothing (specific to your surgery type) to the hospital.  After surgery, you can help prevent lung complications by doing breathing exercises.  Take deep breaths and cough every 1-2 hours. Your doctor may order a device called an Incentive Spirometer to help you take deep breaths. When coughing or sneezing, hold a pillow firmly against your incision with both hands. This is called splinting. Doing this helps protect your incision. It also decreases belly discomfort.  If you are being admitted to the hospital overnight, leave your suitcase in the car. After surgery it may be brought to  your room.  If you are being discharged the day of surgery, you will not be allowed to drive home. You will need a responsible adult (18 years or older) to drive you home and stay with you that night.   If you are taking public transportation, you will need to have a responsible adult (18 years or older) with you. Please confirm with your physician that it is acceptable to use public transportation.   Please call the  [facility]. at [phone] if you have any questions about these instructions.  Surgery Visitation Policy:  Patients undergoing a surgery or procedure may have one family member or support person with them as long as that person is not COVID-19 positive or experiencing its symptoms.  That person may remain in the waiting area during the procedure and may rotate out with other people.  Inpatient Visitation:    Visiting hours are 7 a.m. to 8 p.m. Up to two visitors ages 16+ are allowed at one time in a patient room. The visitors may rotate out with other people during the day. Visitors must check out when they leave, or other visitors will not be allowed. One designated support person may remain overnight. The visitor must pass COVID-19 screenings, use hand sanitizer when entering and exiting the patients room and wear a mask at all times, including in the patients room. Patients must also wear a mask when staff or their visitor are in the room. Masking is required regardless of vaccination status.

## 2021-12-01 ENCOUNTER — Encounter: Payer: Self-pay | Admitting: Neurosurgery

## 2021-12-03 ENCOUNTER — Ambulatory Visit: Payer: Medicare Other

## 2021-12-03 ENCOUNTER — Encounter: Payer: Self-pay | Admitting: Neurosurgery

## 2021-12-03 ENCOUNTER — Ambulatory Visit: Payer: Medicare Other | Admitting: Urgent Care

## 2021-12-03 ENCOUNTER — Other Ambulatory Visit: Payer: Self-pay

## 2021-12-03 ENCOUNTER — Encounter
Admission: RE | Disposition: A | Discharge status: Home / Self Care | Payer: Self-pay | Source: Home / Self Care | Attending: Neurosurgery

## 2021-12-03 ENCOUNTER — Ambulatory Visit
Admission: RE | Admit: 2021-12-03 | Discharge: 2021-12-03 | Disposition: A | Discharge status: Home / Self Care | Payer: Medicare Other | Attending: Neurosurgery | Admitting: Neurosurgery

## 2021-12-03 DIAGNOSIS — Z79899 Other long term (current) drug therapy: Secondary | ICD-10-CM | POA: Insufficient documentation

## 2021-12-03 DIAGNOSIS — I2510 Atherosclerotic heart disease of native coronary artery without angina pectoris: Secondary | ICD-10-CM | POA: Insufficient documentation

## 2021-12-03 DIAGNOSIS — M797 Fibromyalgia: Secondary | ICD-10-CM | POA: Insufficient documentation

## 2021-12-03 DIAGNOSIS — M5412 Radiculopathy, cervical region: Secondary | ICD-10-CM | POA: Insufficient documentation

## 2021-12-03 DIAGNOSIS — G992 Myelopathy in diseases classified elsewhere: Secondary | ICD-10-CM | POA: Insufficient documentation

## 2021-12-03 DIAGNOSIS — I10 Essential (primary) hypertension: Secondary | ICD-10-CM | POA: Insufficient documentation

## 2021-12-03 DIAGNOSIS — M4802 Spinal stenosis, cervical region: Secondary | ICD-10-CM | POA: Insufficient documentation

## 2021-12-03 DIAGNOSIS — M2578 Osteophyte, vertebrae: Secondary | ICD-10-CM | POA: Insufficient documentation

## 2021-12-03 DIAGNOSIS — F419 Anxiety disorder, unspecified: Secondary | ICD-10-CM | POA: Insufficient documentation

## 2021-12-03 DIAGNOSIS — Z419 Encounter for procedure for purposes other than remedying health state, unspecified: Secondary | ICD-10-CM

## 2021-12-03 DIAGNOSIS — M1990 Unspecified osteoarthritis, unspecified site: Secondary | ICD-10-CM | POA: Insufficient documentation

## 2021-12-03 HISTORY — DX: Irritable bowel syndrome, unspecified: K58.9

## 2021-12-03 HISTORY — DX: Essential (primary) hypertension: I10

## 2021-12-03 HISTORY — DX: Unspecified malignant neoplasm of skin, unspecified: C44.90

## 2021-12-03 HISTORY — PX: ANTERIOR CERVICAL DECOMP/DISCECTOMY FUSION: SHX1161

## 2021-12-03 HISTORY — DX: Atherosclerotic heart disease of native coronary artery without angina pectoris: I25.10

## 2021-12-03 HISTORY — DX: Nonrheumatic mitral (valve) prolapse: I34.1

## 2021-12-03 HISTORY — DX: Diaphragmatic hernia without obstruction or gangrene: K44.9

## 2021-12-03 HISTORY — DX: Reserved for inherently not codable concepts without codable children: IMO0001

## 2021-12-03 HISTORY — DX: Endometriosis, unspecified: N80.9

## 2021-12-03 HISTORY — DX: Unspecified hearing loss, unspecified ear: H91.90

## 2021-12-03 HISTORY — DX: Palpitations: R00.2

## 2021-12-03 HISTORY — DX: Shortness of breath: R06.02

## 2021-12-03 HISTORY — DX: Hyperlipidemia, unspecified: E78.5

## 2021-12-03 LAB — ABO/RH: ABO/RH(D): O POS

## 2021-12-03 IMAGING — RF DG CERVICAL SPINE 2 OR 3 VIEWS
1 series · 2 of 2 positions shown · non-contrast
Comparison: MRI [DATE].

CLINICAL DATA: Surgery, elective [DK] ([DK]-CM)

C6-C7 ACDF.
EXAM:
CERVICAL SPINE - 2-3 VIEW

[Series 1: dg x-ray · 0.20mm/px · 2 of 2 slices shown]
[im 1/2]
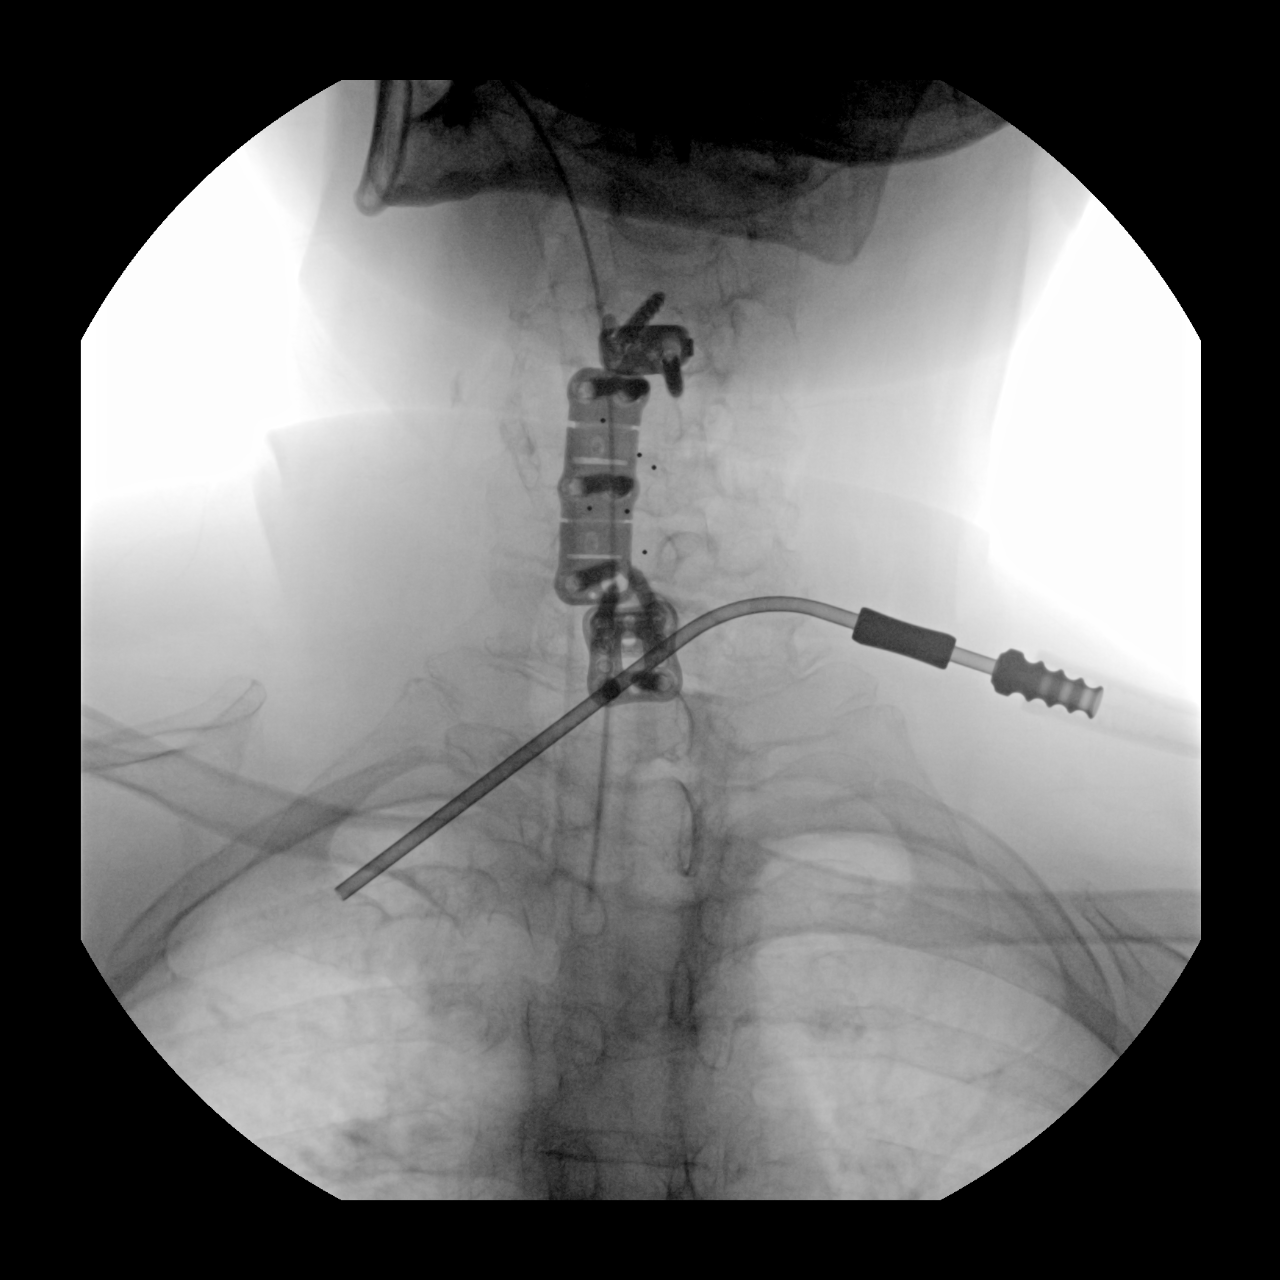
[im 2/2]
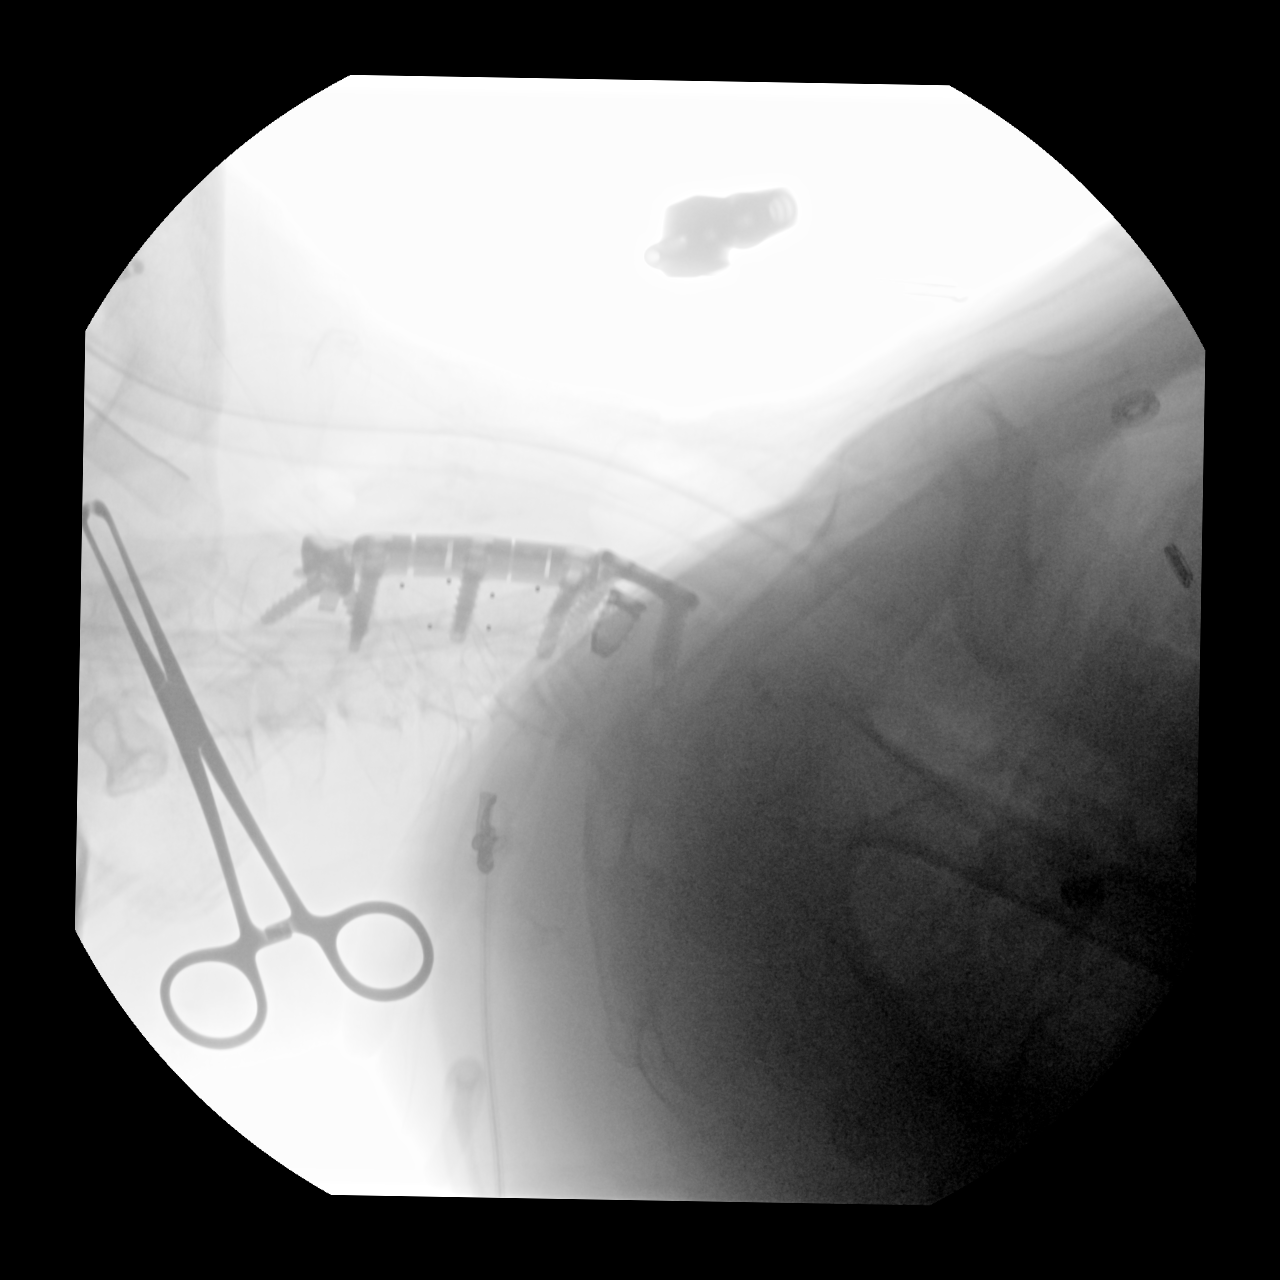

[2 of 2 positions shown; findings below may reference images not displayed]

FINDINGS: Fluoro time: 1 second.

Two C-arm fluoroscopic images were obtained intraoperatively and
submitted for post operative interpretation. Redemonstrated C3-C6
ACDF. Interval placement of a C6-C7 anterior plate and screw with
intervening biomedical device. Please see the performing provider's
procedural report for further detail.
IMPRESSION: Intraoperative fluoroscopy, as detailed above.

## 2021-12-03 SURGERY — ANTERIOR CERVICAL DECOMPRESSION/DISCECTOMY FUSION 1 LEVEL
Anesthesia: General | Site: Spine Cervical

## 2021-12-03 MED ORDER — SENNA 8.6 MG PO TABS: 1.0000 | ORAL_TABLET | Freq: Every day | ORAL | 0 refills | Status: DC | PRN

## 2021-12-03 MED ORDER — HYDROMORPHONE HCL 1 MG/ML IJ SOLN: 0.5000 mg | INTRAMUSCULAR | Status: DC | PRN

## 2021-12-03 MED ORDER — SUCCINYLCHOLINE CHLORIDE 200 MG/10ML IV SOSY
PREFILLED_SYRINGE | INTRAVENOUS | Status: AC
  Filled 2021-12-03: qty 10

## 2021-12-03 MED ORDER — 0.9 % SODIUM CHLORIDE (POUR BTL) OPTIME
TOPICAL | Status: DC | PRN
  Administered 2021-12-03: 500 mL

## 2021-12-03 MED ORDER — ACETAMINOPHEN 10 MG/ML IV SOLN
INTRAVENOUS | Status: AC
  Filled 2021-12-03: qty 100

## 2021-12-03 MED ORDER — CEFAZOLIN SODIUM-DEXTROSE 2-4 GM/100ML-% IV SOLN
2.0000 g | INTRAVENOUS | Status: AC
  Administered 2021-12-03: 2 g via INTRAVENOUS

## 2021-12-03 MED ORDER — CHLORHEXIDINE GLUCONATE 0.12 % MT SOLN
OROMUCOSAL | Status: AC
  Administered 2021-12-03: 15 mL via OROMUCOSAL
  Filled 2021-12-03: qty 15

## 2021-12-03 MED ORDER — DEXAMETHASONE SODIUM PHOSPHATE 10 MG/ML IJ SOLN
INTRAMUSCULAR | Status: AC
  Filled 2021-12-03: qty 1

## 2021-12-03 MED ORDER — LACTATED RINGERS IV SOLN: INTRAVENOUS | Status: DC

## 2021-12-03 MED ORDER — OXYCODONE HCL 5 MG PO TABS
5.0000 mg | ORAL_TABLET | Freq: Once | ORAL | Status: AC | PRN
  Administered 2021-12-03: 5 mg via ORAL

## 2021-12-03 MED ORDER — BUPIVACAINE-EPINEPHRINE (PF) 0.5% -1:200000 IJ SOLN
INTRAMUSCULAR | Status: AC
  Filled 2021-12-03: qty 30

## 2021-12-03 MED ORDER — DEXMEDETOMIDINE (PRECEDEX) IN NS 20 MCG/5ML (4 MCG/ML) IV SYRINGE
PREFILLED_SYRINGE | INTRAVENOUS | Status: AC
  Filled 2021-12-03: qty 5

## 2021-12-03 MED ORDER — BUPIVACAINE-EPINEPHRINE (PF) 0.5% -1:200000 IJ SOLN
INTRAMUSCULAR | Status: DC | PRN
  Administered 2021-12-03: 5 mL via PERINEURAL

## 2021-12-03 MED ORDER — SEVOFLURANE IN SOLN
RESPIRATORY_TRACT | Status: AC
  Filled 2021-12-03: qty 250

## 2021-12-03 MED ORDER — KETAMINE HCL 50 MG/5ML IJ SOSY
PREFILLED_SYRINGE | INTRAMUSCULAR | Status: AC
  Filled 2021-12-03: qty 5

## 2021-12-03 MED ORDER — FENTANYL CITRATE (PF) 100 MCG/2ML IJ SOLN
INTRAMUSCULAR | Status: AC
Start: 2021-12-03 — End: ?
  Filled 2021-12-03: qty 2

## 2021-12-03 MED ORDER — SUCCINYLCHOLINE CHLORIDE 200 MG/10ML IV SOSY
PREFILLED_SYRINGE | INTRAVENOUS | Status: DC | PRN
  Administered 2021-12-03: 100 mg via INTRAVENOUS

## 2021-12-03 MED ORDER — METHOCARBAMOL 500 MG PO TABS: 500.0000 mg | ORAL_TABLET | Freq: Three times a day (TID) | ORAL | 0 refills | Status: DC | PRN

## 2021-12-03 MED ORDER — ORAL CARE MOUTH RINSE: 15.0000 mL | Freq: Once | OROMUCOSAL | Status: AC

## 2021-12-03 MED ORDER — FENTANYL CITRATE (PF) 100 MCG/2ML IJ SOLN: 25.0000 ug | INTRAMUSCULAR | Status: DC | PRN

## 2021-12-03 MED ORDER — ONDANSETRON HCL 4 MG/2ML IJ SOLN
INTRAMUSCULAR | Status: DC | PRN
  Administered 2021-12-03 (×2): 4 mg via INTRAVENOUS

## 2021-12-03 MED ORDER — LIDOCAINE HCL (CARDIAC) PF 100 MG/5ML IV SOSY
PREFILLED_SYRINGE | INTRAVENOUS | Status: DC | PRN
  Administered 2021-12-03: 100 mg via INTRAVENOUS

## 2021-12-03 MED ORDER — OXYCODONE HCL 5 MG/5ML PO SOLN: 5.0000 mg | Freq: Once | ORAL | Status: AC | PRN

## 2021-12-03 MED ORDER — PROPOFOL 500 MG/50ML IV EMUL
INTRAVENOUS | Status: DC | PRN
  Administered 2021-12-03: 100 ug/kg/min via INTRAVENOUS

## 2021-12-03 MED ORDER — PHENYLEPHRINE HCL-NACL 20-0.9 MG/250ML-% IV SOLN
INTRAVENOUS | Status: AC
  Filled 2021-12-03: qty 250

## 2021-12-03 MED ORDER — ACETAMINOPHEN 10 MG/ML IV SOLN
INTRAVENOUS | Status: DC | PRN
  Administered 2021-12-03: 1000 mg via INTRAVENOUS

## 2021-12-03 MED ORDER — PROPOFOL 1000 MG/100ML IV EMUL
INTRAVENOUS | Status: AC
  Filled 2021-12-03: qty 100

## 2021-12-03 MED ORDER — DEXAMETHASONE SODIUM PHOSPHATE 10 MG/ML IJ SOLN
INTRAMUSCULAR | Status: DC | PRN
  Administered 2021-12-03: 10 mg via INTRAVENOUS

## 2021-12-03 MED ORDER — EPHEDRINE SULFATE (PRESSORS) 50 MG/ML IJ SOLN
INTRAMUSCULAR | Status: DC | PRN
  Administered 2021-12-03: 5 mg via INTRAVENOUS
  Administered 2021-12-03: 10 mg via INTRAVENOUS

## 2021-12-03 MED ORDER — ONDANSETRON HCL 4 MG/2ML IJ SOLN
INTRAMUSCULAR | Status: AC
Start: 2021-12-03 — End: ?
  Filled 2021-12-03: qty 4

## 2021-12-03 MED ORDER — CHLORHEXIDINE GLUCONATE 0.12 % MT SOLN: 15.0000 mL | Freq: Once | OROMUCOSAL | Status: AC

## 2021-12-03 MED ORDER — DEXMEDETOMIDINE (PRECEDEX) IN NS 20 MCG/5ML (4 MCG/ML) IV SYRINGE
PREFILLED_SYRINGE | INTRAVENOUS | Status: DC | PRN
  Administered 2021-12-03 (×2): 8 ug via INTRAVENOUS
  Administered 2021-12-03: 4 ug via INTRAVENOUS

## 2021-12-03 MED ORDER — ACETAMINOPHEN 10 MG/ML IV SOLN: 1000.0000 mg | Freq: Once | INTRAVENOUS | Status: DC | PRN

## 2021-12-03 MED ORDER — CEFAZOLIN SODIUM-DEXTROSE 2-4 GM/100ML-% IV SOLN
INTRAVENOUS | Status: AC
  Filled 2021-12-03: qty 100

## 2021-12-03 MED ORDER — FENTANYL CITRATE (PF) 100 MCG/2ML IJ SOLN
INTRAMUSCULAR | Status: DC | PRN
  Administered 2021-12-03 (×3): 50 ug via INTRAVENOUS

## 2021-12-03 MED ORDER — APREPITANT 40 MG PO CAPS: 40.0000 mg | ORAL_CAPSULE | Freq: Once | ORAL | Status: AC

## 2021-12-03 MED ORDER — LIDOCAINE HCL (PF) 2 % IJ SOLN
INTRAMUSCULAR | Status: AC
  Filled 2021-12-03: qty 5

## 2021-12-03 MED ORDER — SURGIFLO WITH THROMBIN (HEMOSTATIC MATRIX KIT) OPTIME
TOPICAL | Status: DC | PRN
  Administered 2021-12-03: 1 via TOPICAL

## 2021-12-03 MED ORDER — PHENYLEPHRINE HCL-NACL 20-0.9 MG/250ML-% IV SOLN
INTRAVENOUS | Status: DC | PRN
  Administered 2021-12-03: 30 ug/min via INTRAVENOUS

## 2021-12-03 MED ORDER — APREPITANT 40 MG PO CAPS
ORAL_CAPSULE | ORAL | Status: AC
  Administered 2021-12-03: 40 mg via ORAL
  Filled 2021-12-03: qty 1

## 2021-12-03 MED ORDER — EPHEDRINE 5 MG/ML INJ
INTRAVENOUS | Status: AC
  Filled 2021-12-03: qty 5

## 2021-12-03 MED ORDER — PROPOFOL 10 MG/ML IV BOLUS
INTRAVENOUS | Status: DC | PRN
  Administered 2021-12-03: 120 mg via INTRAVENOUS

## 2021-12-03 MED ORDER — FENTANYL CITRATE (PF) 100 MCG/2ML IJ SOLN
INTRAMUSCULAR | Status: AC
  Filled 2021-12-03: qty 2

## 2021-12-03 MED ORDER — PHENYLEPHRINE 40 MCG/ML (10ML) SYRINGE FOR IV PUSH (FOR BLOOD PRESSURE SUPPORT)
PREFILLED_SYRINGE | INTRAVENOUS | Status: DC | PRN
  Administered 2021-12-03 (×2): 80 ug via INTRAVENOUS
  Administered 2021-12-03: 160 ug via INTRAVENOUS

## 2021-12-03 MED ORDER — OXYCODONE HCL 5 MG PO TABS: 5.0000 mg | ORAL_TABLET | ORAL | 0 refills | Status: AC | PRN

## 2021-12-03 MED ORDER — ONDANSETRON HCL 4 MG/2ML IJ SOLN: 4.0000 mg | Freq: Once | INTRAMUSCULAR | Status: DC | PRN

## 2021-12-03 MED ORDER — OXYCODONE HCL 5 MG PO TABS
ORAL_TABLET | ORAL | Status: AC
  Filled 2021-12-03: qty 1

## 2021-12-03 SURGICAL SUPPLY — 59 items
ADH SKN CLS APL DERMABOND .7 (GAUZE/BANDAGES/DRESSINGS) ×1
AGENT HMST KT MTR STRL THRMB (HEMOSTASIS) ×1
APL PRP STRL LF DISP 70% ISPRP (MISCELLANEOUS) ×2
BASKET BONE COLLECTION (BASKET) IMPLANT
BULB RESERV EVAC DRAIN JP 100C (MISCELLANEOUS) IMPLANT
BUR NEURO DRILL SOFT 3.0X3.8M (BURR) ×2 IMPLANT
CHLORAPREP W/TINT 26 (MISCELLANEOUS) ×4 IMPLANT
COUNTER NEEDLE 20/40 LG (NEEDLE) ×2 IMPLANT
CUP MEDICINE 2OZ PLAST GRAD ST (MISCELLANEOUS) ×2 IMPLANT
DERMABOND ADVANCED (GAUZE/BANDAGES/DRESSINGS) ×1
DERMABOND ADVANCED .7 DNX12 (GAUZE/BANDAGES/DRESSINGS) ×1 IMPLANT
DRAIN CHANNEL JP 10F RND 20C F (MISCELLANEOUS) IMPLANT
DRAPE C ARM PK CFD 31 SPINE (DRAPES) ×2 IMPLANT
DRAPE LAPAROTOMY 77X122 PED (DRAPES) ×2 IMPLANT
DRAPE MICROSCOPE SPINE 48X150 (DRAPES) ×2 IMPLANT
DRAPE SURG 17X11 SM STRL (DRAPES) ×5 IMPLANT
ELECT CAUTERY BLADE TIP 2.5 (TIP) ×2
ELECT REM PT RETURN 9FT ADLT (ELECTROSURGICAL) ×2
ELECTRODE CAUTERY BLDE TIP 2.5 (TIP) ×1 IMPLANT
ELECTRODE REM PT RTRN 9FT ADLT (ELECTROSURGICAL) ×1 IMPLANT
ELEVATER PASSER (SPINAL CORD STIMULATOR) ×2
GAUZE 4X4 16PLY ~~LOC~~+RFID DBL (SPONGE) ×2 IMPLANT
GLOVE SURG SYN 6.5 ES PF (GLOVE) ×2 IMPLANT
GLOVE SURG SYN 6.5 PF PI (GLOVE) ×1 IMPLANT
GLOVE SURG SYN 8.5  E (GLOVE) ×3
GLOVE SURG SYN 8.5 E (GLOVE) ×3 IMPLANT
GLOVE SURG SYN 8.5 PF PI (GLOVE) ×3 IMPLANT
GLOVE SURG UNDER POLY LF SZ6.5 (GLOVE) ×2 IMPLANT
GOWN SRG LRG LVL 4 IMPRV REINF (GOWNS) ×1 IMPLANT
GOWN SRG XL LVL 3 NONREINFORCE (GOWNS) ×1 IMPLANT
GOWN STRL NON-REIN TWL XL LVL3 (GOWNS) ×2
GOWN STRL REIN LRG LVL4 (GOWNS) ×2
GRADUATE 1200CC STRL 31836 (MISCELLANEOUS) ×2 IMPLANT
KIT TURNOVER KIT A (KITS) ×2 IMPLANT
MANIFOLD NEPTUNE II (INSTRUMENTS) ×2 IMPLANT
MARKER SKIN DUAL TIP RULER LAB (MISCELLANEOUS) ×4 IMPLANT
NDL SAFETY ECLIPSE 18X1.5 (NEEDLE) ×1 IMPLANT
NEEDLE HYPO 18GX1.5 SHARP (NEEDLE) ×2
NEEDLE HYPO 22GX1.5 SAFETY (NEEDLE) ×2 IMPLANT
PACK LAMINECTOMY NEURO (CUSTOM PROCEDURE TRAY) ×2 IMPLANT
PAD ARMBOARD 7.5X6 YLW CONV (MISCELLANEOUS) ×4 IMPLANT
PASSER ELEVATOR (SPINAL CORD STIMULATOR) IMPLANT
PIN CASPAR 14 (PIN) ×1 IMPLANT
PIN CASPAR 14MM (PIN) ×2
PLATE ANT CERV XTEND 1 LV 12 (Plate) ×1 IMPLANT
PUTTY DBX 1CC (Putty) ×2 IMPLANT
PUTTY DBX 1CC DEPUY (Putty) IMPLANT
SCREW VAR 4.2 XD SELF DRILL 16 (Screw) ×4 IMPLANT
SPACER C HEDRON 12X14 7M 7D (Spacer) ×1 IMPLANT
SPONGE KITTNER 5P (MISCELLANEOUS) ×2 IMPLANT
STAPLER SKIN PROX 35W (STAPLE) IMPLANT
SURGIFLO W/THROMBIN 8M KIT (HEMOSTASIS) ×2 IMPLANT
SUT V-LOC 90 ABS DVC 3-0 CL (SUTURE) ×2 IMPLANT
SUT VIC AB 3-0 SH 8-18 (SUTURE) ×2 IMPLANT
SYR 30ML LL (SYRINGE) ×2 IMPLANT
TAPE CLOTH 3X10 WHT NS LF (GAUZE/BANDAGES/DRESSINGS) ×2 IMPLANT
TOWEL OR 17X26 4PK STRL BLUE (TOWEL DISPOSABLE) ×6 IMPLANT
TRAY FOLEY MTR SLVR 16FR STAT (SET/KITS/TRAYS/PACK) IMPLANT
TUBING CONNECTING 10 (TUBING) ×2 IMPLANT

## 2021-12-03 NOTE — Discharge Instructions (Addendum)
?Your surgeon has performed an operation on your cervical spine (neck) to relieve pressure on the spinal cord and/or nerves. This involved making an incision in the front of your neck and removing one or more of the discs that support your spine. Next, a small piece of bone, a titanium plate, and screws were used to fuse two or more of the vertebrae (bones) together. ? ?The following are instructions to help in your recovery once you have been discharged from the hospital. Even if you feel well, it is important that you follow these activity guidelines. If you do not let your neck heal properly from the surgery, you can increase the chance of return of your symptoms and other complications. ? ?* Do not take anti-inflammatory medications for 3 months after surgery (naproxen [Aleve], ibuprofen [Advil, Motrin], etc.). These medications can prevent your bones from healing properly. ? ?Activity  ?  ?No bending, lifting, or twisting (?BLT?). Avoid lifting objects heavier than 10 pounds (gallon milk jug).  Where possible, avoid household activities that involve lifting, bending, reaching, pushing, or pulling such as laundry, vacuuming, grocery shopping, and childcare. Try to arrange for help from friends and family for these activities while your back heals. ? ?Increase physical activity slowly as tolerated.  Taking short walks is encouraged, but avoid strenuous exercise. Do not jog, run, bicycle, lift weights, or participate in any other exercises unless specifically allowed by your doctor. ? ?Talk to your doctor before resuming sexual activity. ? ?You should not drive until cleared by your doctor. ? ?Until released by your doctor, you should not return to work or school.  You should rest at home and let your body heal.  ? ?You may shower three days after your surgery.  After showering, lightly dab your incision dry. Do not take a tub bath or go swimming until approved by your doctor at your follow-up  appointment. ? ? ?If you smoke, we strongly recommend that you quit.  Smoking has been proven to interfere with normal bone healing and will dramatically reduce the success rate of your surgery. Please contact [facility] (800-QUIT-NOW) and use the resources at www.[facility].com for assistance in stopping smoking. ? ?Surgical Incision ?  ?Keep your incision area clean and dry. ? ?Your incision was closed with Dermabond glue. The glue should begin to peel away within about a week. ? ?Diet          ? ?You may return to your usual diet. However, you may experience discomfort when swallowing in the first month after your surgery. This is normal. You may find that softer foods are more comfortable for you to swallow. Be sure to stay hydrated. ? ?When to Contact us ? ?You may experience pain in your neck and/or pain between your shoulder blades. This is normal and should improve in the next few weeks with the help of pain medication, muscle relaxers, and rest. Some patients report that a warm compress on the back of the neck or between the shoulder blades helps. ? ?However, should you experience any of the following, contact us immediately: ?New numbness or weakness ?Pain that is progressively getting worse, and is not relieved by your pain medication, muscle relaxers, rest, and warm compresses ?Bleeding, redness, swelling, pain, or drainage from surgical incision ?Chills or flu-like symptoms ?Fever greater than 101.0 F (38.3 C) ?Inability to eat, drink fluids, or take medications ?Problems with bowel or bladder functions ?Difficulty breathing or shortness of breath ?Warmth, tenderness, or swelling in your calf ?  Contact Information ?During office hours (Monday-Friday 9 am to 5 pm), please call your physician at [phone] and ask for Berdine Addison ?After hours and weekends, please call [phone] and speak with the answering service, who will contact the doctor on call.  If that fails, call the [place] Operator at  [phone] and ask for the Neurosurgery Resident On Call  ?For a life-threatening emergency, call 911 ? ?AMBULATORY SURGERY  ?DISCHARGE INSTRUCTIONS ? ? ?The drugs that you were given will stay in your system until tomorrow so for the next 24 hours you should not: ? ?Drive an automobile ?Make any legal decisions ?Drink any alcoholic beverage ? ? ?You may resume regular meals tomorrow.  Today it is better to start with liquids and gradually work up to solid foods. ? ?You may eat anything you prefer, but it is better to start with liquids, then soup and crackers, and gradually work up to solid foods. ? ? ?Please notify your doctor immediately if you have any unusual bleeding, trouble breathing, redness and pain at the surgery site, drainage, fever, or pain not relieved by medication. ? ? ? ?Additional Instructions: ? ? ?Please contact your physician with any problems or Same Day Surgery at [phone], Monday through Friday 6 am to 4 pm, or [place] [facility] number at [phone].  ?  ?

## 2021-12-03 NOTE — H&P (Signed)
I have reviewed and confirmed my history and physical from 11/13/2021 with no additions or changes. Plan for C6-7 ACDF.  Risks and benefits reviewed. ? ?Heart sounds normal no MRG. Chest Clear to Auscultation Bilaterally. ? ? ?  ? ?

## 2021-12-03 NOTE — Discharge Summary (Signed)
Physician Discharge Summary  ?Patient ID: ?Suzanne Escobar ?MRN: [id number] ?DOB/AGE: [birth year] 76 y.o. ? ?Admit date: 12/03/2021 ?Discharge date: 12/03/2021 ? ?Admission Diagnoses: cervical radiculopathy  ? ?Discharge Diagnoses:  ?Active Problems: ?  * No active hospital problems. * ? ? ?Discharged Condition: good ? ?Hospital Course:  ?Suzanne Escobar is a 76 y.o s/p C6-7 ACDF. Her interoperative course was uncomplicated. She was monitored in PACU for 4 hours post-op and discharged home after ambulating, urinating, and tolerating PO intake. She was given prescriptions for pain medication, muscle relaxer and stool softener.  ? ?Consults: None ? ?Significant Diagnostic Studies: none  ? ?Treatments: surgery: as above. Please see separately dictated operative report for further details. ? ?Discharge Exam: ?Blood pressure 128/73, pulse 73, temperature 98 ?F (36.7 ?C), resp. rate 17, SpO2 100 %. ?CN II-XII grossly intact ?5/5 strength throughout BUE ?Incision c/d/I with dermabond in place ? ?Disposition: Discharge disposition: 01-Home or Self Care ? ? ? ? ? ? ? ?Allergies as of 12/03/2021   ? ?   Reactions  ? Hydrocodone-acetaminophen Itching  ? Can take if she takes benadryl with it.  ? ?  ? ?  ?Medication List  ?  ? ?STOP taking these medications   ? ?diphenhydrAMINE 25 MG tablet ?Commonly known as: BENADRYL ?  ?docusate sodium 100 MG capsule ?Commonly known as: COLACE ?  ?ibuprofen 200 MG tablet ?Commonly known as: ADVIL ?  ?traMADol 50 MG tablet ?Commonly known as: ULTRAM ?  ?traZODone 50 MG tablet ?Commonly known as: DESYREL ?  ? ?  ? ?TAKE these medications   ? ?acetaminophen 500 MG tablet ?Commonly known as: TYLENOL ?Take 1,000 mg by mouth every 6 (six) hours as needed. ?  ?calcium carbonate 500 MG chewable tablet ?Commonly known as: TUMS - dosed in mg elemental calcium ?Chew 500 mg by mouth daily as needed for indigestion or heartburn. ?  ?celecoxib 50 MG capsule ?Commonly known as: CELEBREX ?Take 50 mg by mouth  daily. ?  ?gabapentin 300 MG capsule ?Commonly known as: NEURONTIN ?Take 600 mg by mouth 2 (two) times daily. ?  ?K2 PLUS D3 PO ?Take 1 tablet by mouth daily. ?  ?magnesium oxide 400 MG tablet ?Commonly known as: MAG-OX ?Take 400 mg by mouth daily. ?  ?Melatonin 1 MG Caps ?Take 1 mg by mouth at bedtime. ?  ?methocarbamol 500 MG tablet ?Commonly known as: Robaxin ?Take 1 tablet (500 mg total) by mouth 3 (three) times daily as needed for muscle spasms. ?  ?metoprolol succinate 25 MG 24 hr tablet ?Commonly known as: TOPROL-XL ?Take 25 mg by mouth every evening. ?  ?oxyCODONE 5 MG immediate release tablet ?Commonly known as: Roxicodone ?Take 1 tablet (5 mg total) by mouth every 4 (four) hours as needed for up to 5 days for severe pain. ?What changed: how much to take ?  ?Potassium 99 MG Tabs ?Take 1 tablet by mouth daily. ?  ?Restasis 0.05 % ophthalmic emulsion ?Generic drug: cycloSPORINE ?Place 1 drop into both eyes 2 (two) times daily. ?  ?rosuvastatin 5 MG tablet ?Commonly known as: CRESTOR ?Take 5 mg by mouth daily. ?  ?senna 8.6 MG Tabs tablet ?Commonly known as: SENOKOT ?Take 1 tablet (8.6 mg total) by mouth daily as needed for mild constipation. ?  ?VB6 P5P PO ?Take 1 capsule by mouth daily. ?  ? ?  ? ? Follow-up Information   ? ? Loleta Dicker, PA Follow up in 2 week(s).   ?Why: for post-op and incision  check. This appointment should be in your pre-op paperwork. Please call the office with any questions or concerns regarding date and time ?Contact information: ?[place] [address] ?[phone] ? ? ?  ?  ? ?  ?  ? ?  ? ? ?Signed: ?Loleta Dicker ?12/03/2021, 9:00 AM ? ? ?

## 2021-12-03 NOTE — Transfer of Care (Signed)
Immediate Anesthesia Transfer of Care Note ? ?Patient: Suzanne Escobar ? ?Procedure(s) Performed: C6-7 ANTERIOR CERVICAL DISCECTOMY AND FUSION (GLOBUS HEDRON) (Spine Cervical) ? ?Patient Location: PACU ? ?Anesthesia Type:General ? ?Level of Consciousness: awake and alert  ? ?Airway & Oxygen Therapy: Patient Spontanous Breathing and Patient connected to face mask oxygen ? ?Post-op Assessment: Report given to RN and Post -op Vital signs reviewed and stable ? ?Post vital signs: Reviewed and stable ? ?Last Vitals:  ?Vitals Value Taken Time  ?BP 124/53 12/03/21 0901  ?Temp    ?Pulse    ?Resp 17 12/03/21 0901  ?SpO2 99 % 12/03/21 0901  ? ? ?Last Pain:  ?Vitals:  ? 12/03/21 0651  ?PainSc: 0-No pain  ?   ? ?  ? ?Complications: No notable events documented. ?

## 2021-12-03 NOTE — Anesthesia Postprocedure Evaluation (Signed)
Anesthesia Post Note ? ?Patient: Suzanne Escobar ? ?Procedure(s) Performed: C6-7 ANTERIOR CERVICAL DISCECTOMY AND FUSION (GLOBUS HEDRON) (Spine Cervical) ? ?Patient location during evaluation: PACU ?Anesthesia Type: General ?Level of consciousness: awake and alert ?Pain management: pain level controlled ?Vital Signs Assessment: post-procedure vital signs reviewed and stable ?Respiratory status: spontaneous breathing, nonlabored ventilation, respiratory function stable and patient connected to nasal cannula oxygen ?Cardiovascular status: blood pressure returned to baseline and stable ?Postop Assessment: no apparent nausea or vomiting ?Anesthetic complications: no ? ? ?No notable events documented. ? ? ?Last Vitals:  ?Vitals:  ? 12/03/21 1034 12/03/21 1146  ?BP: (!) 107/49 110/60  ?Pulse: 75 75  ?Resp: 16 16  ?Temp:  37 ?C  ?SpO2: 98% 100%  ?  ?Last Pain:  ?Vitals:  ? 12/03/21 0930  ?PainSc: 0-No pain  ? ? ?  ?  ?  ?  ?  ?  ? ?Arita Miss ? ? ? ? ?

## 2021-12-03 NOTE — Anesthesia Procedure Notes (Addendum)
Procedure Name: Intubation ?Date/Time: 12/03/2021 7:31 AM ?Performed by: Jonna Clark, CRNA ?Pre-anesthesia Checklist: Patient identified, Patient being monitored, Timeout performed, Emergency Drugs available and Suction available ?Patient Re-evaluated:Patient Re-evaluated prior to induction ?Oxygen Delivery Method: Circle system utilized ?Preoxygenation: Pre-oxygenation with 100% oxygen ?Induction Type: IV induction ?Ventilation: Mask ventilation without difficulty ?Laryngoscope Size: 3 and McGraph ?Grade View: Grade I ?Tube type: Oral ?Tube size: 7.0 mm ?Number of attempts: 1 ?Airway Equipment and Method: Stylet ?Placement Confirmation: ETT inserted through vocal cords under direct vision, positive ETCO2 and breath sounds checked- equal and bilateral ?Secured at: 21 cm ?Tube secured with: Tape ?Dental Injury: Teeth and Oropharynx as per pre-operative assessment  ?Comments: Use of Mcgrath blade for minimal neck extension. Neck neutral for intubation. Tolerated well ? ? ? ? ?

## 2021-12-03 NOTE — Op Note (Signed)
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 she failed conservative management, and elected to proceed with surgery. ? ?Findings: stenosis ? 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 ? ?EBL: 10 ml ?IVF: see AR ml ?Drains: none ?Disposition: Extubated and Stable to PACU ?Complications: none ? ?No foley catheter was placed. ? ? ?Preoperative Note:  ? ?Risks of surgery discussed include: infection, bleeding, stroke, coma, death, paralysis, CSF leak, nerve/spinal cord injury, numbness, tingling, weakness, complex regional pain syndrome, recurrent stenosis and/or disc herniation, vascular injury, development of instability, neck/back pain, need for further surgery, persistent symptoms, development of deformity, and the risks of anesthesia. The patient understood these risks and agreed to proceed. ? ?Operative Note:  ? ?Procedure:  ?1) Anterior cervical diskectomy and fusion at C6-7 ?2) Anterior cervical instrumentation at C6-7 using Globus Xtend ?3) Insertion of biomechanical device at C6-7 ?  ?Procedure: After obtaining informed consent, the patient taken to the operating room, placed in supine position, general anesthesia induced.  The patient had a small shoulder roll placed behind their shoulders.  The patient received preop antibiotics and IV Decadron.  The patient had a neck incision outlined, was prepped and draped in usual sterile fashion. The incision was injected with local anesthetic.   An incision was opened, dissection taken down medial to the carotid artery and jugular vein, lateral to the trachea and esophagus.  The prevertebral fascia was identified, and  the prior plate was identified for localization.  The left C6 screw was removed.  The longus colli were dissected laterally, and self-retaining retractors placed to open the operative field. The  microscope was then brought into the field. ? ?With this complete, distractor pins were placed in the vertebral bodies of C6 and C7. The distractor was placed, and the annulus at C6/7 was opened using a bovie.  Curettes and pituitary rongeurs used to remove the majority of disk, then the drill was used to remove the posterior osteophyte and begin the foraminotomies. The nerve hook was used to elevate the posterior longitudinal ligament, which was then removed with Kerrison rongeurs. Bilateral foraminotomies were performed. The microblunt nerve hook could be passed out the foramen bilaterally.   Meticulous hemostasis was obtained.  A biomechanical device (Globus Hedron C 7 mm height x 14 mm width by 12 mm depth) was placed at C6/7. The device had been filled with allograft for aid in arthrodesis. ? ?The caspar distractor was removed, and bone wax used for hemostasis. A 12 mm Globus Xtend plate was chosen.  Two screws placed in each vertebral body, respectively making sure the screws were behind the locking mechanism.  Final AP and lateral radiographs were taken.  ? ?With everything in good position, the wound was irrigated copiously with bacitracin-containing solution and meticulous hemostasis obtained.  Wound was closed in 2 layers using interrupted inverted 3-0 Vicryl sutures.  The wound was dressed with dermabond, the head of bed at 30 degrees, taken to recovery room in stable condition.  No new postop neurological deficits were identified. ? ?Sponge and pattie counts were correct at the end of the procedure.  ? ?I performed the entire procedure with the assistance of Cooper Render PA as an Pensions consultant. ? ?Meade Maw MD ? ? ? ? ?

## 2021-12-03 NOTE — Anesthesia Preprocedure Evaluation (Signed)
Anesthesia Evaluation  ?Patient identified by MRN, date of birth, ID band ?Patient awake ? ?General Assessment Comment: ? ?Patient with hx of hallucinations post operatively, PoNV, prolonged emergence.  ? ?Reviewed: ?Allergy & Precautions, H&P , NPO status , Patient's Chart, lab work & pertinent test results ? ?History of Anesthesia Complications ?(+) PONV, PROLONGED EMERGENCE, Emergence Delirium and history of anesthetic complications ? ?Airway ?Mallampati: II ? ?TM Distance: >3 FB ?Neck ROM: Limited ? ? ? Dental ?no notable dental hx. ?(+) Teeth Intact, Implants ?  ?Pulmonary ?neg pulmonary ROS, neg sleep apnea, neg COPD,  ?  ?Pulmonary exam normal ?breath sounds clear to auscultation ? ? ? ? ? ? Cardiovascular ?hypertension, Pt. on medications ?(-) angina+ CAD  ?(-) Past MI and (-) Cardiac Stents (-) dysrhythmias  ?Rhythm:regular Rate:Normal ?- Systolic murmurs ? ?  ?Neuro/Psych ?PSYCHIATRIC DISORDERS Anxiety  Neuromuscular disease   ? GI/Hepatic ?negative GI ROS, Neg liver ROS,   ?Endo/Other  ?negative endocrine ROS ? Renal/GU ?  ? ?  ?Musculoskeletal ? ?(+) Arthritis , Fibromyalgia -Not on chronic opioids  ? Abdominal ?  ?Peds ? Hematology ?negative hematology ROS ?(+)   ?Anesthesia Other Findings ?No past medical history on file. ? ?No past surgical history on file. ? ? ? ? Reproductive/Obstetrics ?negative OB ROS ? ?  ? ? ? ? ? ? ? ? ? ? ? ? ? ?  ?  ? ? ? ? ? ? ? ? ?Anesthesia Physical ? ?Anesthesia Plan ? ?ASA: 2 ? ?Anesthesia Plan: General  ? ?Post-op Pain Management: Ofirmev IV (intra-op)* and Dilaudid IV  ? ?Induction: Intravenous ? ?PONV Risk Score and Plan: 4 or greater and Dexamethasone, Ondansetron, Treatment may vary due to age or medical condition, Aprepitant and Propofol infusion ? ?Airway Management Planned: Oral ETT and Video Laryngoscope Planned ? ?Additional Equipment: None ? ?Intra-op Plan:  ? ?Post-operative Plan: Extubation in OR ? ?Informed Consent: I have  reviewed the patients History and Physical, chart, labs and discussed the procedure including the risks, benefits and alternatives for the proposed anesthesia with the patient or authorized representative who has indicated his/her understanding and acceptance.  ? ? ? ?Dental Advisory Given ? ?Plan Discussed with: Anesthesiologist, CRNA and Surgeon ? ?Anesthesia Plan Comments: (Discussed risks of anesthesia with patient, including PONV, sore throat, lip/dental/eye damage. Rare risks discussed as well, such as cardiorespiratory and neurological sequelae, and allergic reactions. Discussed the role of CRNA in patient's perioperative care. Patient understands. ?Will avoid ketamine due to hx of unpleasant hallucinations, emergence delirium. Will plan on video scope and extreme care with neck extension.)  ? ? ? ? ? ? ?Anesthesia Quick Evaluation ? ?

## 2021-12-03 NOTE — Progress Notes (Signed)
PHARMACY -  BRIEF ANTIBIOTIC NOTE  ? ?Pharmacy has received consult(s) for Cefazolin from an OR provider.  The patient's profile has been reviewed for ht/wt/allergies/indication/available labs.   ? ?One time order(s) placed for Cefazolin 2 gm once pre-op per pt wt: 86.6 kg. ? ?Further antibiotics/pharmacy consults should be ordered by admitting physician if indicated.       ?                ?Renda Rolls, PharmD, MBA ?12/03/2021 ?6:31 AM ? ? ?

## 2021-12-04 ENCOUNTER — Encounter: Payer: Self-pay | Admitting: Neurosurgery

## 2022-02-05 ENCOUNTER — Other Ambulatory Visit: Payer: Self-pay | Admitting: Neurosurgery

## 2022-02-05 DIAGNOSIS — G959 Disease of spinal cord, unspecified: Secondary | ICD-10-CM

## 2022-02-05 DIAGNOSIS — Z981 Arthrodesis status: Secondary | ICD-10-CM

## 2022-02-05 NOTE — Progress Notes (Signed)
Patient on schedule for Myelogram 5/24, spoke with patient on phone with pre procedure instructions given. Made aware to be NPO at 0430 day of procedure, liquids after MN, and driver post procedure/recovery/discharge. To be here at 0830 day of procedure. Stated understanding. ?

## 2022-02-18 ENCOUNTER — Ambulatory Visit
Admission: RE | Admit: 2022-02-18 | Discharge: 2022-02-18 | Disposition: A | Discharge status: Home / Self Care | Payer: Medicare Other | Source: Ambulatory Visit | Attending: Neurosurgery | Admitting: Neurosurgery

## 2022-02-18 DIAGNOSIS — Z981 Arthrodesis status: Secondary | ICD-10-CM | POA: Diagnosis not present

## 2022-02-18 DIAGNOSIS — G959 Disease of spinal cord, unspecified: Secondary | ICD-10-CM | POA: Diagnosis present

## 2022-02-18 DIAGNOSIS — M4802 Spinal stenosis, cervical region: Secondary | ICD-10-CM | POA: Diagnosis not present

## 2022-02-18 DIAGNOSIS — M542 Cervicalgia: Secondary | ICD-10-CM | POA: Diagnosis not present

## 2022-02-18 DIAGNOSIS — M4712 Other spondylosis with myelopathy, cervical region: Secondary | ICD-10-CM | POA: Insufficient documentation

## 2022-02-18 IMAGING — RF DG MYELOGRAM CERVICAL
8 series · 8 of 8 positions shown · non-contrast
Comparison: none

CLINICAL DATA: Status post cervical fusion, neck pain along the
left side.
TECHNIQUE: Contiguous axial images were obtained through the cervical spine
after the intrathecal infusion of contrast. Coronal and sagittal
reconstructions were obtained of the axial image sets.

[Series 1: fluoro_myelogram_singleshot_bw · 0.20mm/px · 1 of 1 slices shown (1 of 2)]
[im 1/1]
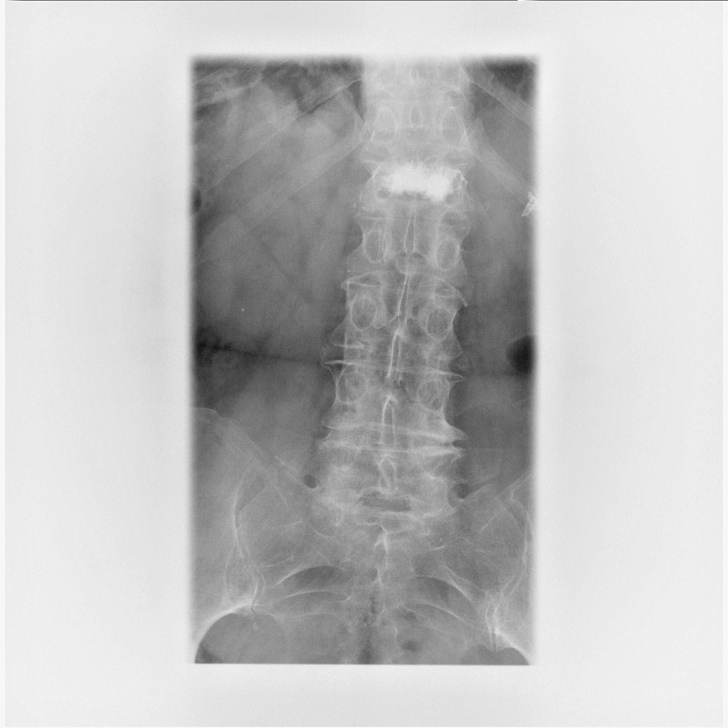

[Series 2: fluoro_myelogram_singleshot_bw · 0.20mm/px · 1 of 1 slices shown (2 of 2)]
[im 1/1]
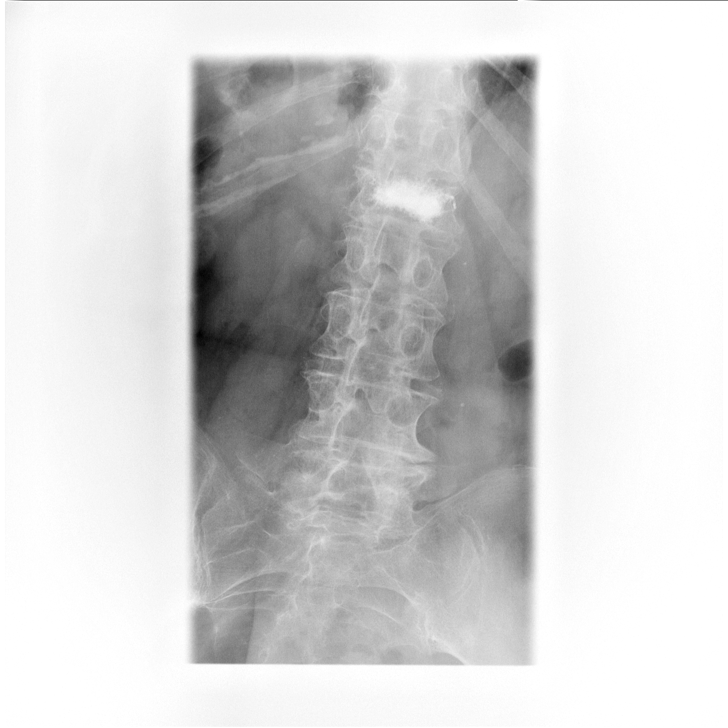

[Series 3: cp_standard · 0.19mm/px · 1 of 1 slices shown (1 of 6)]
[im 1/1]
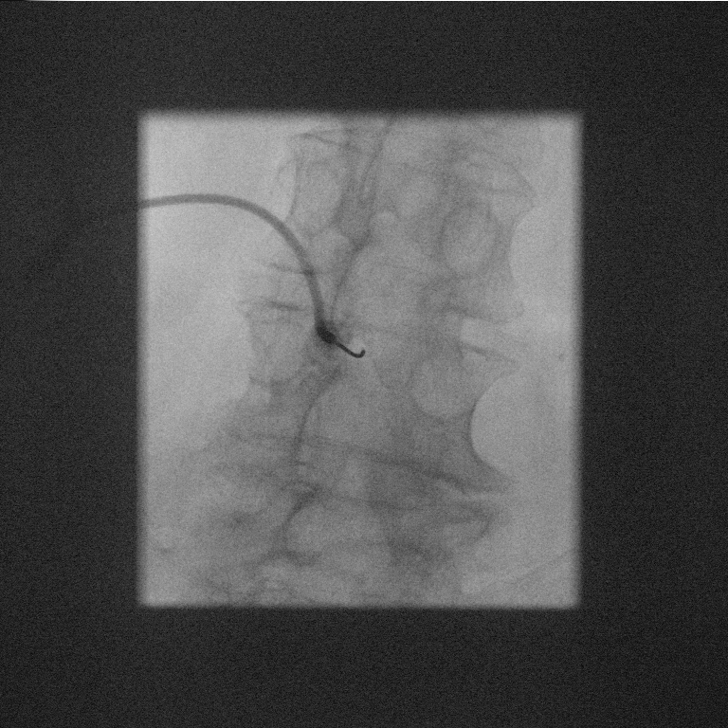

[Series 4: cp_standard · 0.19mm/px · 1 of 1 slices shown (2 of 6)]
[im 1/1]
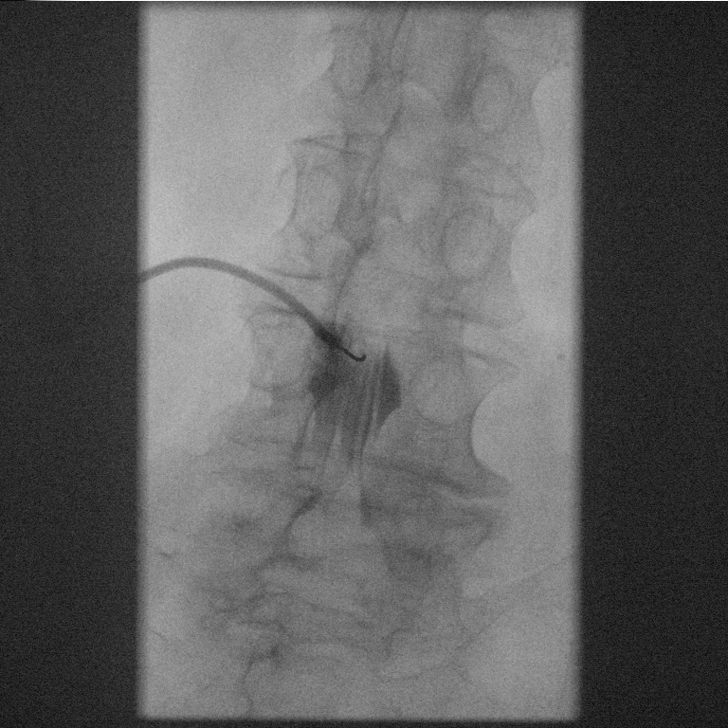

[Series 5: cp_standard · 0.19mm/px · 1 of 1 slices shown (3 of 6)]
[im 1/1]
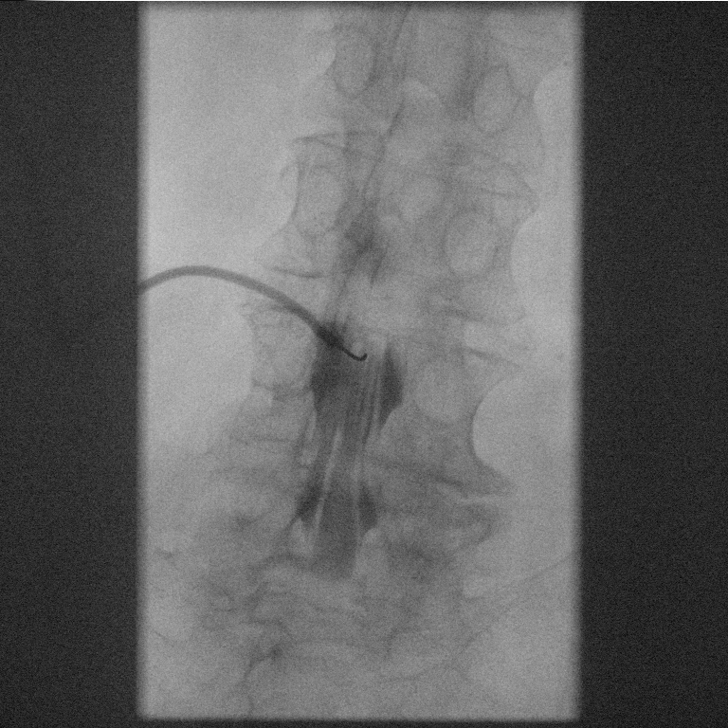

[Series 6: cp_standard · 0.19mm/px · 1 of 1 slices shown (4 of 6)]
[im 1/1]
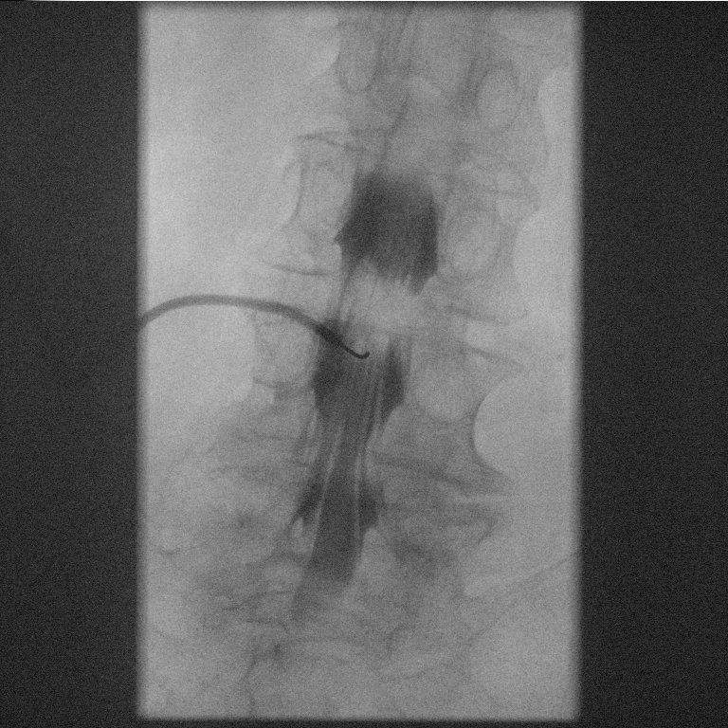

[Series 7: cp_standard · 0.19mm/px · 1 of 1 slices shown (5 of 6)]
[im 1/1]
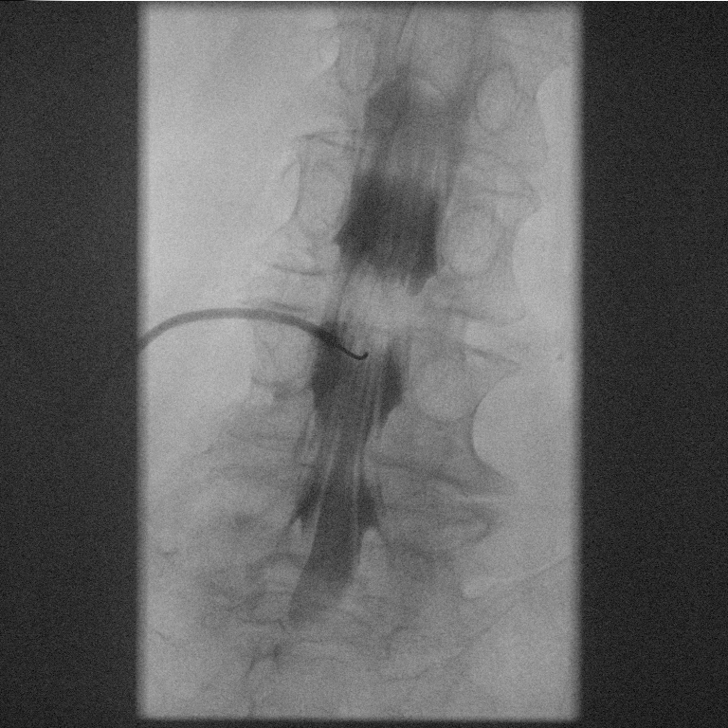

[Series 8: cp_standard · 0.20mm/px · 1 of 1 slices shown (6 of 6)]
[im 1/1]
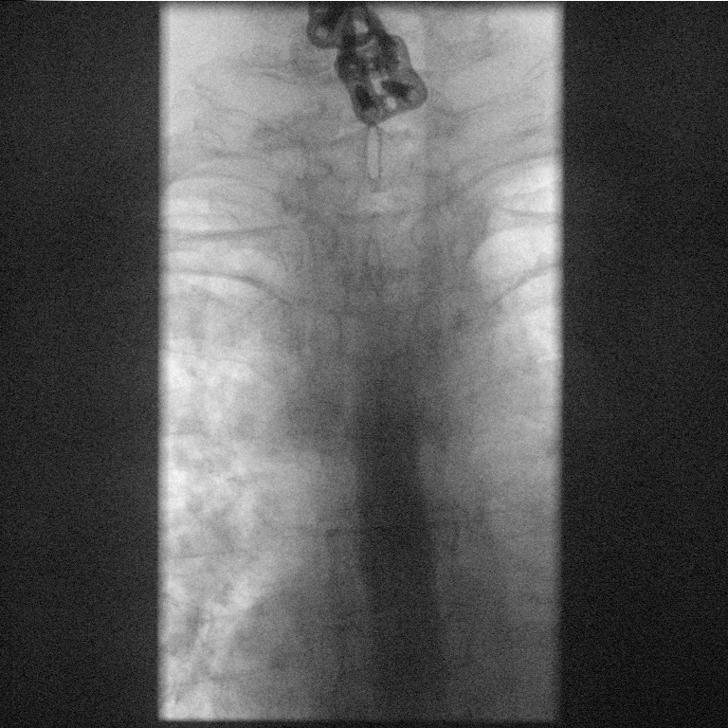

[8 of 8 positions shown; findings below may reference images not displayed]

EXAM:
CERVICAL MYELOGRAM

CT CERVICAL MYELOGRAM

FLUOROSCOPY:
7.5 mGy

PROCEDURE:
LUMBAR PUNCTURE FOR CERVICAL MYELOGRAM

After thorough discussion of risks and benefits of the procedure
including bleeding, infection, injury to nerves, blood vessels,
adjacent structures as well as headache and CSF leak, written and
oral informed consent was obtained. Consent was obtained by REVOREDO
REVOREDO We discussed the high likelihood of obtaining a
diagnostic study.

Patient was positioned prone on the fluoroscopy table. Local
anesthesia was provided with 1% lidocaine without epinephrine after
prepped and draped in the usual sterile fashion. Puncture was
performed at L3-4 using a 3 1/2 inch 22-gauge spinal needle via
medial approach. Using a single pass through the dura, the needle
was placed within the thecal sac, with return of clear CSF. 10 mL of
Isovue [BY] was injected into the thecal sac, with normal
opacification of the nerve roots and cauda equina consistent with
free flow within the subarachnoid space. The patient was then moved
to the trendelenburg position and contrast flowed into the cervical
spine region.

I personally supervised the lumbar puncture and administration of
the intrathecal contrast. I also personally supervised acquisition
of the myelogram images.
FINDINGS: CERVICAL MYELOGRAM FINDINGS:

Lumbar approach for a cervical myelogram with opacification of the
thecal sac. Patient placed in Trendelenburg for advancement of the
contrast into the cervical thecal sac.

CT CERVICAL MYELOGRAM FINDINGS:

Alignment: Anatomic alignment.  No static listhesis.

Skull base and vertebrae: No acute fracture. No aggressive lytic or
sclerotic osseous lesion.

Soft tissues and spinal canal: Intraspinal soft tissues are not
fully imaged on this examination due to poor soft tissue contrast,
but there is no gross soft tissue abnormality. No prevertebral fluid
or swelling. No visible canal hematoma.

Disc levels: Anterior cervical fusion from C3 through C7 without
hardware failure or complication.

C1-2: Symmetry is preserved between the dens and lateral masses of
C1. The dens and C1 are intact, without evidence of fracture.

C2-3: Mild broad-based disc bulge. No foraminal or central canal
stenosis.

C3-4: Interbody fusion with posterior osseous ridging. Mild left
facet arthropathy. Moderate left foraminal stenosis. No right
foraminal stenosis. No spinal stenosis.

C4-5: Interbody fusion. Mild bilateral facet arthropathy. Mild left
foraminal stenosis. No right foraminal stenosis. No spinal stenosis.

C5-C6: Interbody fusion. Mild bilateral facet arthropathy. No
foraminal or central canal stenosis.

C6-C7: Interbody fusion. Severe bilateral facet arthropathy, left
greater than right. Moderate bilateral foraminal stenosis.

C7-T1: No disc protrusion, foraminal stenosis or central canal
stenosis.

Upper chest: Lung apices are clear.

Other: No fluid collection or hematoma. 11 mm hypodense right
thyroid nodule. Not clinically significant; no follow-up imaging
recommended (ref: [HOSPITAL]. [DATE]): 143-50).
IMPRESSION: 1. Anterior cervical fusion from C3 through C7 without hardware
failure or complication.
2. Multilevel cervical spondylosis as described above. Moderate left
foraminal stenosis at C3-C4. Mild left foraminal stenosis at C4-C5.
Moderate bilateral foraminal stenosis at C6-C7.
3.  No acute osseous injury of the cervical spine.

## 2022-02-18 IMAGING — CT CT CERVICAL SPINE W/ CM
3 of 4 series · 12 of 33 positions shown, 14 images · non-contrast
Comparison: none

CLINICAL DATA: Status post cervical fusion, neck pain along the
left side.
TECHNIQUE: Contiguous axial images were obtained through the cervical spine
after the intrathecal infusion of contrast. Coronal and sagittal
reconstructions were obtained of the axial image sets.

[Series 7: sagittal bone · sagittal · 0.31mm/px · 5 of 80 slices shown, 6 images]
[im 27/80  bone]
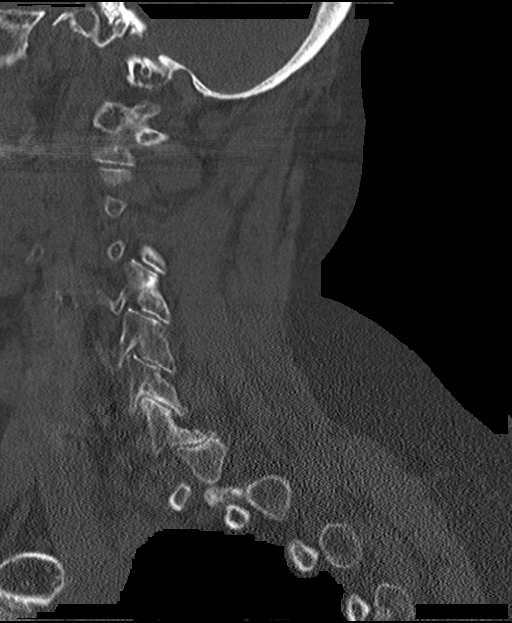
[im 33/80  bone]
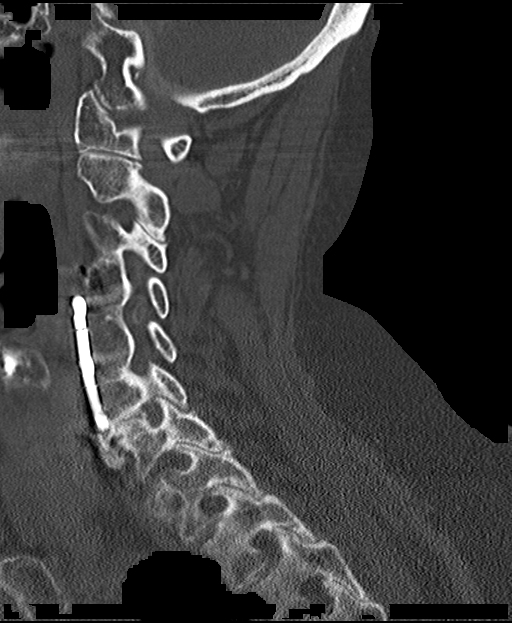
[im 40/80  soft-tissue]
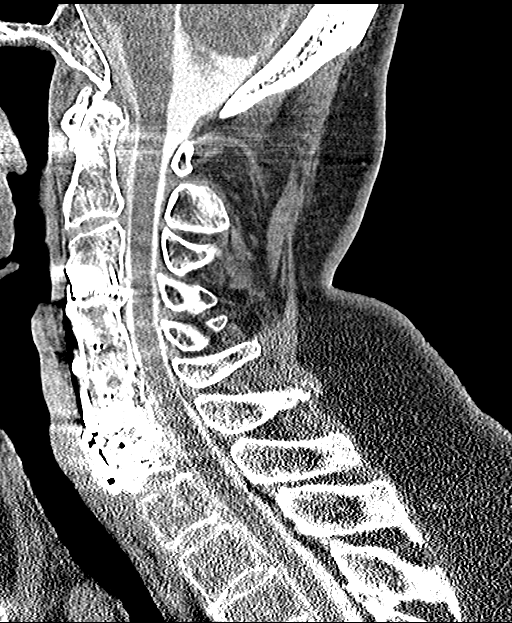
[im 40/80  bone]
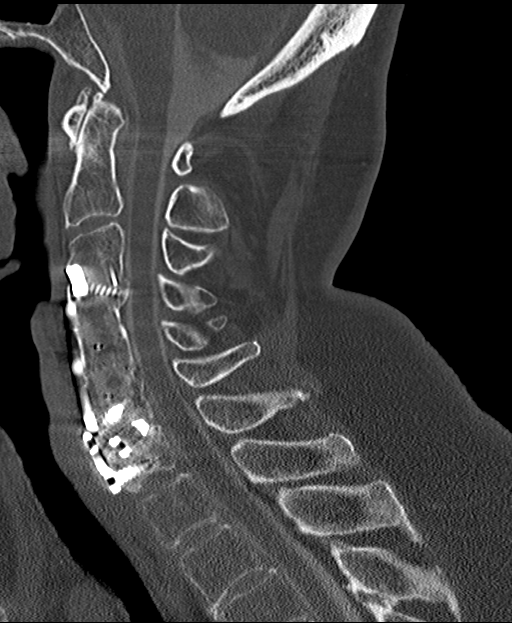
[im 47/80  bone]
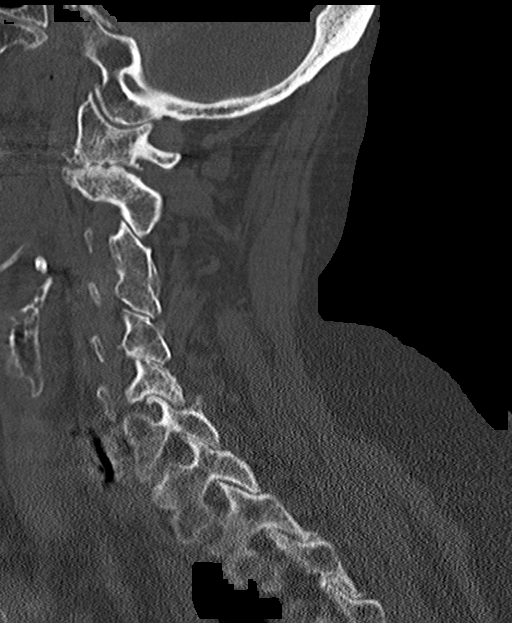
[im 53/80  bone]
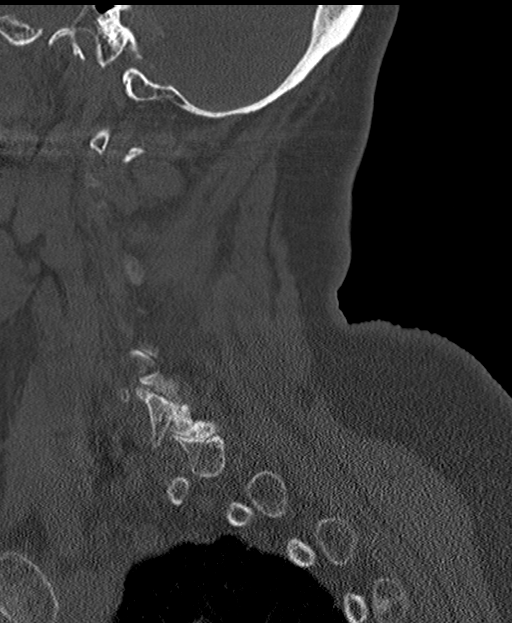

[Series 8: coronal bone · coronal · 0.31mm/px · 3 of 73 slices shown]
[im 15/73  bone]
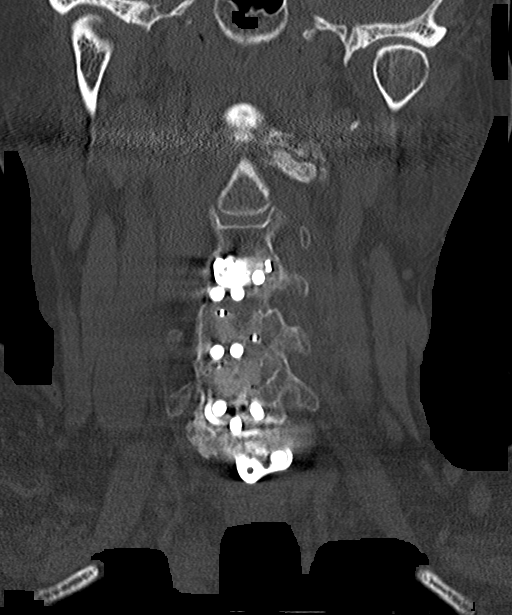
[im 29/73  bone]
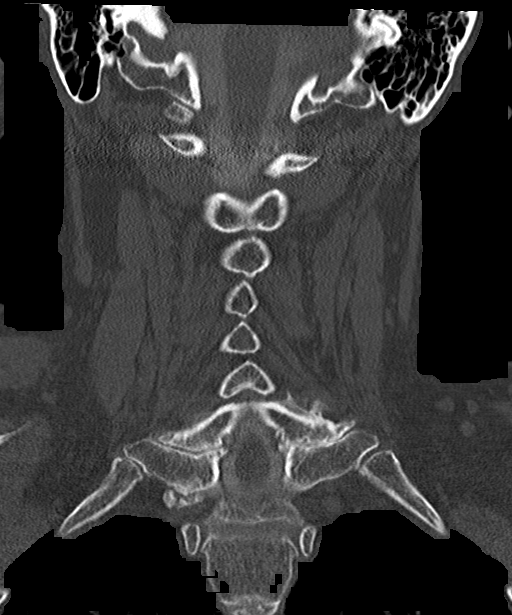
[im 44/73  bone]
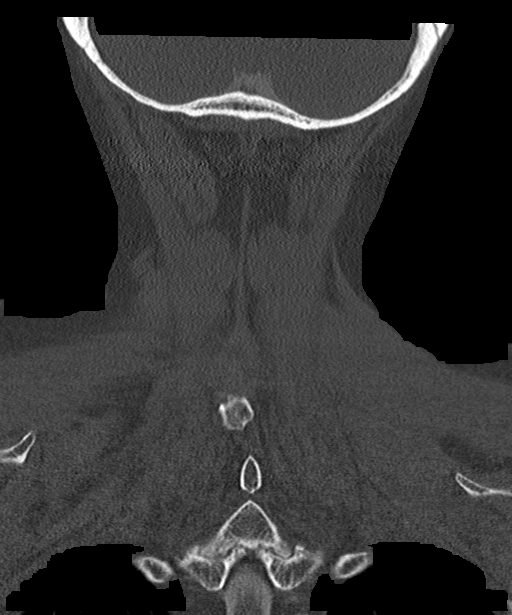

[Series 10: orthogonal bone · axial · 0.21mm/px · z∈[+240,+380]mm · 4 of 121 slices shown, 5 images]
[im 18/121  soft-tissue]
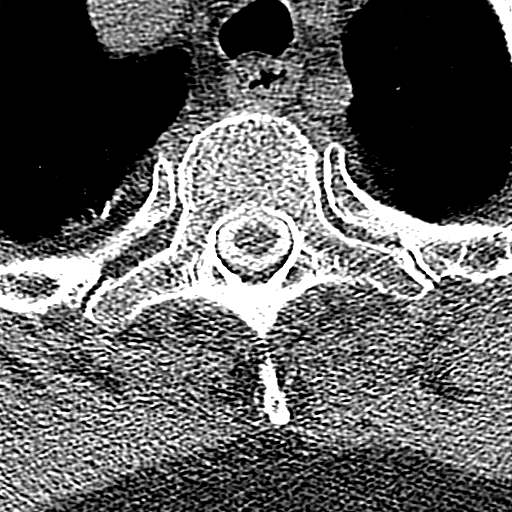
[im 18/121  bone]
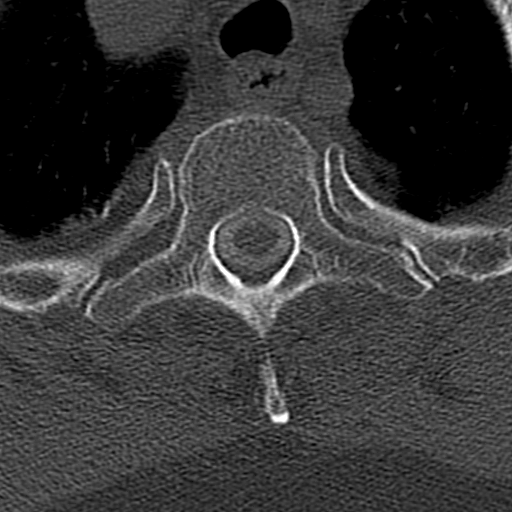
[im 52/121  bone]
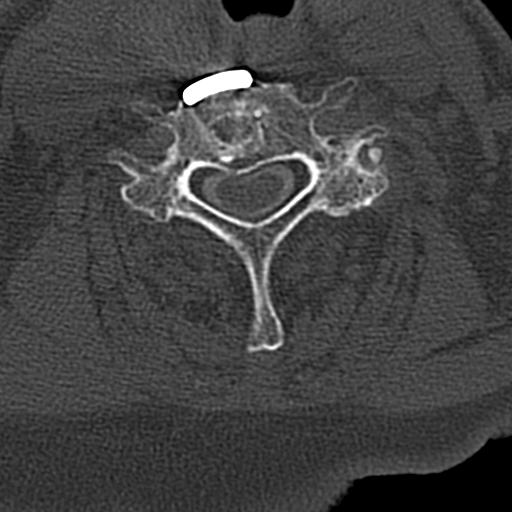
[im 69/121  bone]
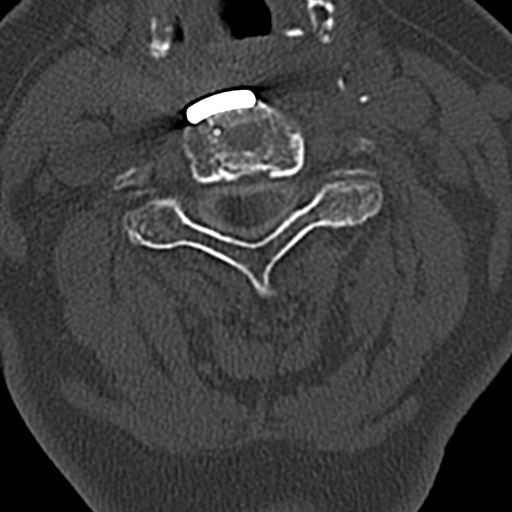
[im 103/121  bone]
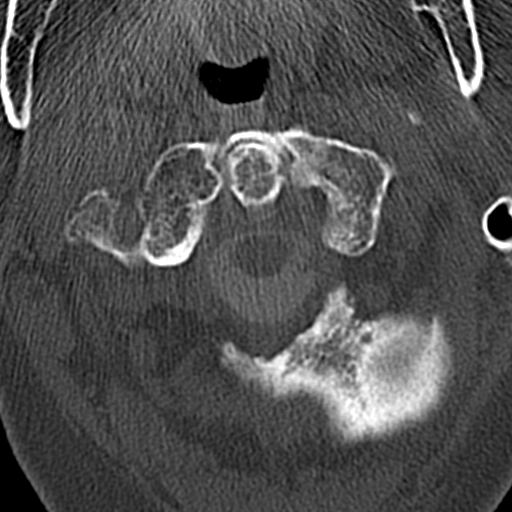

[12 of 33 positions shown; findings below may reference images not displayed]

EXAM:
CERVICAL MYELOGRAM

CT CERVICAL MYELOGRAM

FLUOROSCOPY:
7.5 mGy

PROCEDURE:
LUMBAR PUNCTURE FOR CERVICAL MYELOGRAM

After thorough discussion of risks and benefits of the procedure
including bleeding, infection, injury to nerves, blood vessels,
adjacent structures as well as headache and CSF leak, written and
oral informed consent was obtained. Consent was obtained by REVOREDO
REVOREDO We discussed the high likelihood of obtaining a
diagnostic study.

Patient was positioned prone on the fluoroscopy table. Local
anesthesia was provided with 1% lidocaine without epinephrine after
prepped and draped in the usual sterile fashion. Puncture was
performed at L3-4 using a 3 1/2 inch 22-gauge spinal needle via
medial approach. Using a single pass through the dura, the needle
was placed within the thecal sac, with return of clear CSF. 10 mL of
Isovue [BY] was injected into the thecal sac, with normal
opacification of the nerve roots and cauda equina consistent with
free flow within the subarachnoid space. The patient was then moved
to the trendelenburg position and contrast flowed into the cervical
spine region.

I personally supervised the lumbar puncture and administration of
the intrathecal contrast. I also personally supervised acquisition
of the myelogram images.
FINDINGS: CERVICAL MYELOGRAM FINDINGS:

Lumbar approach for a cervical myelogram with opacification of the
thecal sac. Patient placed in Trendelenburg for advancement of the
contrast into the cervical thecal sac.

CT CERVICAL MYELOGRAM FINDINGS:

Alignment: Anatomic alignment.  No static listhesis.

Skull base and vertebrae: No acute fracture. No aggressive lytic or
sclerotic osseous lesion.

Soft tissues and spinal canal: Intraspinal soft tissues are not
fully imaged on this examination due to poor soft tissue contrast,
but there is no gross soft tissue abnormality. No prevertebral fluid
or swelling. No visible canal hematoma.

Disc levels: Anterior cervical fusion from C3 through C7 without
hardware failure or complication.

C1-2: Symmetry is preserved between the dens and lateral masses of
C1. The dens and C1 are intact, without evidence of fracture.

C2-3: Mild broad-based disc bulge. No foraminal or central canal
stenosis.

C3-4: Interbody fusion with posterior osseous ridging. Mild left
facet arthropathy. Moderate left foraminal stenosis. No right
foraminal stenosis. No spinal stenosis.

C4-5: Interbody fusion. Mild bilateral facet arthropathy. Mild left
foraminal stenosis. No right foraminal stenosis. No spinal stenosis.

C5-C6: Interbody fusion. Mild bilateral facet arthropathy. No
foraminal or central canal stenosis.

C6-C7: Interbody fusion. Severe bilateral facet arthropathy, left
greater than right. Moderate bilateral foraminal stenosis.

C7-T1: No disc protrusion, foraminal stenosis or central canal
stenosis.

Upper chest: Lung apices are clear.

Other: No fluid collection or hematoma. 11 mm hypodense right
thyroid nodule. Not clinically significant; no follow-up imaging
recommended (ref: [HOSPITAL]. [DATE]): 143-50).
IMPRESSION: 1. Anterior cervical fusion from C3 through C7 without hardware
failure or complication.
2. Multilevel cervical spondylosis as described above. Moderate left
foraminal stenosis at C3-C4. Mild left foraminal stenosis at C4-C5.
Moderate bilateral foraminal stenosis at C6-C7.
3.  No acute osseous injury of the cervical spine.

## 2022-02-18 MED ORDER — IOHEXOL 300 MG/ML  SOLN
30.0000 mL | Freq: Once | INTRAMUSCULAR | Status: AC | PRN
Start: 2022-02-18 — End: 2022-02-18
  Administered 2022-02-18: 10 mL

## 2022-02-18 MED ORDER — LIDOCAINE HCL (PF) 1 % IJ SOLN
10.0000 mL | Freq: Once | INTRAMUSCULAR | Status: AC
  Administered 2022-02-18: 10 mL
  Filled 2022-02-18: qty 10

## 2022-03-24 ENCOUNTER — Other Ambulatory Visit: Payer: Self-pay | Admitting: Family Medicine

## 2022-03-24 DIAGNOSIS — R519 Headache, unspecified: Secondary | ICD-10-CM

## 2022-03-30 ENCOUNTER — Other Ambulatory Visit: Payer: Medicare Other

## 2022-03-30 ENCOUNTER — Other Ambulatory Visit: Payer: Self-pay | Admitting: Internal Medicine

## 2022-03-30 DIAGNOSIS — Z1231 Encounter for screening mammogram for malignant neoplasm of breast: Secondary | ICD-10-CM

## 2022-04-01 ENCOUNTER — Ambulatory Visit
Admission: RE | Admit: 2022-04-01 | Discharge: 2022-04-01 | Disposition: A | Discharge status: Home / Self Care | Payer: Medicare Other | Source: Ambulatory Visit | Attending: Family Medicine | Admitting: Family Medicine

## 2022-04-01 DIAGNOSIS — R519 Headache, unspecified: Secondary | ICD-10-CM

## 2022-04-27 ENCOUNTER — Ambulatory Visit
Admission: RE | Admit: 2022-04-27 | Discharge: 2022-04-27 | Disposition: A | Discharge status: Home / Self Care | Payer: Medicare Other | Source: Ambulatory Visit | Attending: Internal Medicine | Admitting: Internal Medicine

## 2022-04-27 DIAGNOSIS — Z1231 Encounter for screening mammogram for malignant neoplasm of breast: Secondary | ICD-10-CM | POA: Diagnosis present

## 2022-05-01 ENCOUNTER — Other Ambulatory Visit: Payer: Self-pay | Admitting: Internal Medicine

## 2022-05-01 DIAGNOSIS — R928 Other abnormal and inconclusive findings on diagnostic imaging of breast: Secondary | ICD-10-CM

## 2022-05-01 DIAGNOSIS — R921 Mammographic calcification found on diagnostic imaging of breast: Secondary | ICD-10-CM

## 2022-05-19 ENCOUNTER — Ambulatory Visit
Admission: RE | Admit: 2022-05-19 | Discharge: 2022-05-19 | Disposition: A | Discharge status: Home / Self Care | Payer: Medicare Other | Source: Ambulatory Visit | Attending: Internal Medicine | Admitting: Internal Medicine

## 2022-05-19 DIAGNOSIS — R921 Mammographic calcification found on diagnostic imaging of breast: Secondary | ICD-10-CM | POA: Insufficient documentation

## 2022-05-19 DIAGNOSIS — R928 Other abnormal and inconclusive findings on diagnostic imaging of breast: Secondary | ICD-10-CM | POA: Diagnosis present

## 2022-05-20 ENCOUNTER — Ambulatory Visit
Admission: RE | Admit: 2022-05-20 | Discharge: 2022-05-20 | Disposition: A | Discharge status: Home / Self Care | Payer: Self-pay | Source: Ambulatory Visit | Attending: Neurosurgery | Admitting: Neurosurgery

## 2022-05-20 ENCOUNTER — Other Ambulatory Visit: Payer: Self-pay

## 2022-05-20 DIAGNOSIS — Z049 Encounter for examination and observation for unspecified reason: Secondary | ICD-10-CM

## 2022-05-28 ENCOUNTER — Other Ambulatory Visit: Payer: Self-pay | Admitting: Internal Medicine

## 2022-05-28 DIAGNOSIS — R921 Mammographic calcification found on diagnostic imaging of breast: Secondary | ICD-10-CM

## 2022-05-28 DIAGNOSIS — R928 Other abnormal and inconclusive findings on diagnostic imaging of breast: Secondary | ICD-10-CM

## 2022-05-29 ENCOUNTER — Other Ambulatory Visit: Payer: Self-pay

## 2022-05-29 ENCOUNTER — Ambulatory Visit
Admission: RE | Admit: 2022-05-29 | Discharge: 2022-05-29 | Disposition: A | Discharge status: Home / Self Care | Payer: Medicare Other | Source: Ambulatory Visit | Attending: Neurosurgery | Admitting: Neurosurgery

## 2022-05-29 ENCOUNTER — Other Ambulatory Visit: Payer: Self-pay | Admitting: Neurosurgery

## 2022-05-29 DIAGNOSIS — Z981 Arthrodesis status: Secondary | ICD-10-CM | POA: Insufficient documentation

## 2022-06-02 ENCOUNTER — Encounter: Payer: Self-pay | Admitting: Neurosurgery

## 2022-06-02 ENCOUNTER — Ambulatory Visit (INDEPENDENT_AMBULATORY_CARE_PROVIDER_SITE_OTHER): Payer: Medicare Other | Admitting: Neurosurgery

## 2022-06-02 VITALS — BP 128/78 | Ht 63.5 in | Wt 188.4 lb

## 2022-06-02 DIAGNOSIS — G959 Disease of spinal cord, unspecified: Secondary | ICD-10-CM | POA: Diagnosis not present

## 2022-06-02 DIAGNOSIS — Z09 Encounter for follow-up examination after completed treatment for conditions other than malignant neoplasm: Secondary | ICD-10-CM

## 2022-06-02 DIAGNOSIS — Z981 Arthrodesis status: Secondary | ICD-10-CM | POA: Diagnosis not present

## 2022-06-02 DIAGNOSIS — M4802 Spinal stenosis, cervical region: Secondary | ICD-10-CM | POA: Diagnosis not present

## 2022-06-02 NOTE — Progress Notes (Signed)
DOS: 12/03/21 C6-7 ACDF  HISTORY OF PRESENT ILLNESS: Suzanne Escobar is status post ACDF. She still having some mild neck pain.   Still having right shoulder pain.  She is seeing Dr. Earlie Counts tomorrow.  PHYSICAL EXAMINATION:  NEUROLOGICAL:  General: In no acute distress.  Awake, alert, oriented to person, place, and time. Pupils equal round and reactive to light.  Strength: Side Biceps Triceps Deltoid Interossei Grip Wrist Ext. Wrist Flex.  R '5 5 5 5 5 5 5  '$ L '5 5 5 5 5 5 5   '$ Incision c/d/I and healing well  Imaging:  Ct Myelogram 02/18/22  IMPRESSION:  1. Anterior cervical fusion from C3 through C7 without hardware  failure or complication.  2. Multilevel cervical spondylosis as described above. Moderate left  foraminal stenosis at C3-C4. Mild left foraminal stenosis at C4-C5.  Moderate bilateral foraminal stenosis at C6-C7.  3.  No acute osseous injury of the cervical spine.   Electronically Signed    By: Kathreen Devoid M.D.    On: 02/18/2022 11:14  I would add to this that she has substantial left-sided facet arthrosis at C1-2.  Cervical spine x-rays on September 1, no evidence of complication  Assessment / Plan: Suzanne Escobar is doing well after ACDF.   This point, she is doing well.  I will see her back in 6 months with x-rays.  She is seeing Dr. Earlie Counts tomorrow to evaluate her right shoulder.  I encouraged her to pursue this.    Meade Maw MD, [facility] Department of Neurosurgery

## 2022-06-08 ENCOUNTER — Ambulatory Visit
Admission: RE | Admit: 2022-06-08 | Discharge: 2022-06-08 | Disposition: A | Discharge status: Home / Self Care | Payer: Medicare Other | Source: Ambulatory Visit | Attending: Internal Medicine | Admitting: Internal Medicine

## 2022-06-08 DIAGNOSIS — R928 Other abnormal and inconclusive findings on diagnostic imaging of breast: Secondary | ICD-10-CM | POA: Diagnosis present

## 2022-06-08 DIAGNOSIS — R921 Mammographic calcification found on diagnostic imaging of breast: Secondary | ICD-10-CM | POA: Diagnosis present

## 2022-06-08 HISTORY — PX: BREAST BIOPSY: SHX20

## 2022-06-09 LAB — SURGICAL PATHOLOGY

## 2022-06-11 ENCOUNTER — Encounter: Payer: Self-pay | Admitting: *Deleted

## 2022-06-11 DIAGNOSIS — D0512 Intraductal carcinoma in situ of left breast: Secondary | ICD-10-CM

## 2022-06-11 NOTE — Progress Notes (Signed)
Received referral for newly diagnosed breast cancer from [facility].  Navigation initiated.  Med Onc and Surgical consults scheduled. She will see Dr. Bary Castilla on 9/19 at 4 and Dr. Janese Banks 9/20 at 1:30.

## 2022-06-17 ENCOUNTER — Inpatient Hospital Stay: Payer: Medicare Other

## 2022-06-17 ENCOUNTER — Inpatient Hospital Stay: Payer: Medicare Other | Attending: Oncology | Admitting: Oncology

## 2022-06-17 ENCOUNTER — Other Ambulatory Visit: Payer: Self-pay | Admitting: General Surgery

## 2022-06-17 ENCOUNTER — Encounter: Payer: Self-pay | Admitting: *Deleted

## 2022-06-17 ENCOUNTER — Encounter: Payer: Self-pay | Admitting: Oncology

## 2022-06-17 VITALS — BP 116/68 | HR 86 | Temp 97.7°F | Resp 18 | Wt 189.7 lb

## 2022-06-17 DIAGNOSIS — D0512 Intraductal carcinoma in situ of left breast: Secondary | ICD-10-CM

## 2022-06-17 DIAGNOSIS — I10 Essential (primary) hypertension: Secondary | ICD-10-CM | POA: Diagnosis not present

## 2022-06-17 DIAGNOSIS — Z90710 Acquired absence of both cervix and uterus: Secondary | ICD-10-CM | POA: Diagnosis not present

## 2022-06-17 DIAGNOSIS — D0510 Intraductal carcinoma in situ of unspecified breast: Secondary | ICD-10-CM

## 2022-06-17 DIAGNOSIS — Z807 Family history of other malignant neoplasms of lymphoid, hematopoietic and related tissues: Secondary | ICD-10-CM

## 2022-06-17 DIAGNOSIS — Z803 Family history of malignant neoplasm of breast: Secondary | ICD-10-CM

## 2022-06-17 DIAGNOSIS — Z780 Asymptomatic menopausal state: Secondary | ICD-10-CM

## 2022-06-17 DIAGNOSIS — Z808 Family history of malignant neoplasm of other organs or systems: Secondary | ICD-10-CM

## 2022-06-17 NOTE — Progress Notes (Signed)
Progress Notes - documented in this encounter Suzanne Escobar, Suzanne Boot, MD - 06/16/2022 4:00 PM EDT Formatting of this note is different from the original. Subjective:   Patient ID: Suzanne Escobar is a 76 y.o. female.  HPI  The following portions of the patient's history were reviewed and updated as appropriate.  This a new patient is here today for: office visit. Here for evaluation of newly diagnosis of left breast cancer referred by Dr Tressia Miners. She had a stereotactic biopsy on 06-08-22. She could not feel anything different in the breast prior to the mammogram.  She states her bra size is 38C.  The patient moved here from [place] a couple of years ago to be closer to family. Her granddaughter is a Designer, jewellery in internal medicine at the [facility].  Review of Systems  Constitutional: Negative for chills and fever.  Respiratory: Negative for cough.   Chief Complaint  Patient presents with  New Patient    BP 124/73  Pulse 101  Temp 36.7 C (98.1 F)  Ht 161.3 cm (5' 3.5")  Wt 87.5 kg (193 lb)  LMP (LMP Unknown) Comment: Hysterectomy  SpO2 96%  BMI 33.65 kg/m   Past Medical History:  Diagnosis Date  Allergy  Anemia  Anxiety  Arrhythmia  Closed fracture of left tibia and fibula 65/79/0383  Complication of anesthesia  Dysrhythmia  Endometriosis  Fibromyalgia  GERD (gastroesophageal reflux disease)  Hearing impaired  Heart murmur, unspecified  Heart palpitations  High cholesterol  History of blood transfusion  History of cataract  History of hiatal hernia  Hyperlipidemia  IBS (irritable bowel syndrome)  MVP (mitral valve prolapse)  Nasolacrimal duct obstruction  Osteoarthritis  Osteoporosis  PONV (postoperative nausea and vomiting)  Pulmonary hypertension (CMS-HCC) 08/21/2020  Skin cancer 01/2021  SOB (shortness of breath)    Past Surgical History:  Procedure Laterality Date  LENS EYE SURGERY Left 08/03/2008  Dr. Thomasenia Bottoms,  [place], 23.5D, HD500  LENS EYE SURGERY Right 07/11/2009  Dr. Isaiah Blakes, [place], 22.0D, [id number]  heart cath and coronary angiography Left 01/03/2013  Open reduction and internal fixation of the left distal radius fracture 11/18/2020  Dr Marry Guan  C6-7 anterior cervical discectomy and fusion 12/03/2021  Dr Meade Maw at [facility] [place] W/ LOC DEVICE PLACEMENT AND STEREOTACTIC GUIDANCE Left 06/08/2022  APPENDECTOMY  CATARACT EXTRACTION  CHOLECYSTECTOMY  COLONOSCOPY  EGD  EYELID SURGERY  FRACTURE SURGERY  HYSTERECTOMY  JOINT REPLACEMENT  bilateral hips  NASOLACRIMAL DUCT EYE SURGERY Left  I&C of left lacrimal system  REMOVAL SECONDARY MEMBRANOUS CATARACT Bilateral OD: 10/03/2008, OS: 11/15/2009  REPLACEMENT TOTAL HIP W/ RESURFACING IMPLANTS Bilateral  SHOULDER ARTHROSCOPY DISTAL CLAVICLE EXCISION AND OPEN ROTATOR CUFF REPAIR    OB History   Gravida  5  Para  3  Term   Preterm   AB   Living     SAB   IAB   Ectopic   Molar   Multiple   Live Births     Obstetric Comments  Age at first period 50 Age of first pregnancy 63      Social History   Socioeconomic History  Marital status: Widowed  Number of children: 3  Years of education: GED  Occupational History  Occupation: Retired  Tobacco Use  Smoking status: Never  Smokeless tobacco: Never  Vaping Use  Vaping Use: Never used  Substance and Sexual Activity  Alcohol use: Not Currently  Drug use: Never  Sexual activity: Never  Partners: Male    Allergies  Allergen Reactions  Hydrocodone-Acetaminophen Itching  Can take if she takes benadryl with it.   Current Outpatient Medications  Medication Sig Dispense Refill  amitriptyline (ELAVIL) 50 MG tablet Take 1 tablet (50 mg total) by mouth at bedtime 30 tablet 0  ascorbic acid, vitamin C, (VITAMIN C) 500 MG tablet Take 1 tablet (500 mg total) by mouth once daily  aspirin 81 MG EC tablet Take 81 mg by mouth once daily   benzoyl peroxide 5 % topical gel Apply topically once daily Apply for 3 days prior to surgery. Let stand 5 minutes then wash off. 42.5 g 0  biotin 1 mg tablet Take 1 tablet (1 mg total) by mouth once daily  calcium carbonate (TUMS E-X ORAL) Take 1 tablet by mouth 4 (four) times daily as needed As needed  carboxymethylcellulose (REFRESH TEARS) 0.5 % ophthalmic solution Place 1 drop into both eyes as needed for Dry Eyes  cholecalciferol (VITAMIN D3) 1,000 unit capsule Take 1 capsule (1,000 Units total) by mouth once daily  cycloSPORINE (RESTASIS) 0.05 % ophthalmic emulsion Restasis 0.05 % eye drops in a dropperette  FUROsemide (LASIX) 20 MG tablet Take 1 tablet (20 mg total) by mouth once daily as needed for Edema or High Blood Pressure (for SBP>165) 30 tablet 2  magnesium oxide 500 mg Cap Take 500 mg by mouth once daily  melatonin 5 mg Tab Take by mouth  metoprolol succinate (TOPROL-XL) 25 MG XL tablet TAKE 1 TABLET(25 MG) BY MOUTH DAILY 90 tablet 1  rosuvastatin (CRESTOR) 5 MG tablet Take 1 tablet (5 mg total) by mouth once daily 30 tablet 11  topiramate (TOPAMAX) 50 MG tablet Take 1 tablet (50 mg total) by mouth 2 (two) times daily 60 tablet 2   No current facility-administered medications for this visit.   Family History  Problem Relation Age of Onset  Heart failure Mother  High blood pressure (Hypertension) Mother  Obesity Mother  Osteoarthritis Mother  Thyroid disease Mother  Myocardial Infarction (Heart attack) Father  Osteoarthritis Father  Breast cancer Sister  Bipolar disorder Sister  Breast cancer Sister  Thyroid disease Sister  Myocardial Infarction (Heart attack) Brother  Skin cancer Brother  Myocardial Infarction (Heart attack) Brother  Cancer Brother  Thyroid disease Daughter  Thyroid disease Daughter  Breast cancer Maternal Aunt  Breast cancer Cousin  Cervical cancer Niece  Glaucoma Neg Hx  Macular degeneration Neg Hx   Labs and Radiology:   June 08, 2022 left breast pathology:  DIAGNOSIS:  A BREAST, LEFT, POSTERIOR OUTER QUADRANT; STEREOTACTIC CORE BIOPSIES:  - LOW-GRADE DUCTAL CARCINOMA IN SITU.  - NO EVIDENCE OF INVASION.  - CALCIFICATIONS IDENTIFIED  Imaging review:  April 24, 2021 through June 08, 2022 images independently reviewed.  April 30, 2028 laboratory:y   Glucose 70 - 110 mg/dL 91  Sodium 136 - 145 mmol/L 141  Potassium 3.6 - 5.1 mmol/L 4.4  Chloride 97 - 109 mmol/L 107  Carbon Dioxide (CO2) 22.0 - 32.0 mmol/L 27.1  Urea Nitrogen (BUN) 7 - 25 mg/dL 13  Creatinine 0.6 - 1.1 mg/dL 0.8  Glomerular Filtration Rate (eGFR), MDRD Estimate >60 mL/min/1.73sq m 70  Calcium 8.7 - 10.3 mg/dL 8.8  AST 8 - 39 U/L 14  ALT 5 - 38 U/L 10  Alk Phos (alkaline Phosphatase) 34 - 104 U/L 78  Albumin 3.5 - 4.8 g/dL 3.9  Bilirubin, Total 0.3 - 1.2 mg/dL 0.4  Protein, Total 6.1 - 7.9 g/dL 6.4  A/G Ratio 1.0 - 5.0 gm/dL 1.6  WBC (White Blood Cell Count) 4.1 - 10.2 10^3/uL 4.5  RBC (Red Blood Cell Count) 4.04 - 5.48 10^6/uL 3.77 Low  Hemoglobin 12.0 - 15.0 gm/dL 11.4 Low  Hematocrit 35.0 - 47.0 % 35.5  MCV (Mean Corpuscular Volume) 80.0 - 100.0 fl 94.2  MCH (Mean Corpuscular Hemoglobin) 27.0 - 31.2 pg 30.2  MCHC (Mean Corpuscular Hemoglobin Concentration) 32.0 - 36.0 gm/dL 32.1  Platelet Count 150 - 450 10^3/uL 238  RDW-CV (Red Cell Distribution Width) 11.6 - 14.8 % 13.1  MPV (Mean Platelet Volume) 9.4 - 12.4 fl 10.3  Neutrophils 1.50 - 7.80 10^3/uL 2.24  Lymphocytes 1.00 - 3.60 10^3/uL 1.72  Monocytes 0.00 - 1.50 10^3/uL 0.44  Eosinophils 0.00 - 0.55 10^3/uL 0.08  Basophils 0.00 - 0.09 10^3/uL 0.03  Neutrophil % 32.0 - 70.0 % 49.5  Lymphocyte % 10.0 - 50.0 % 38.1  Monocyte % 4.0 - 13.0 % 9.7  Eosinophil % 1.0 - 5.0 % 1.8  Basophil% 0.0 - 2.0 % 0.7  Immature Granulocyte % <=0.7 % 0.2  Immature Granulocyte Count <=0.06 10^3/L 0.01   Hemoglobin A1C 4.2 - 5.6 % 5.6   Iron 28 - 170 ug/dL 50  Total Iron Binding  Capacity (TIBC) 261.0 - 478.0 ug/dL 518.8 High  Transferrin 203.0 - 362.0 mg/dL 370.6 High  % Saturation % 10   Ferritin 11 - 307 ng/mL 11    Objective:  Physical Exam Exam conducted with a chaperone present.  Constitutional:  Appearance: Normal appearance.  Cardiovascular:  Rate and Rhythm: Normal rate and regular rhythm.  Pulses: Normal pulses.  Heart sounds: Normal heart sounds.  Pulmonary:  Effort: Pulmonary effort is normal.  Breath sounds: Normal breath sounds.  Chest:  Breasts: Right: Normal.  Left: Normal.  Comments: Minimal thickening at the biopsy site. No bruising. Musculoskeletal:  Cervical back: Neck supple.  Lymphadenopathy:  Upper Body:  Right upper body: No supraclavicular or axillary adenopathy.  Left upper body: No supraclavicular or axillary adenopathy.  Skin: General: Skin is warm and dry.  Neurological:  Mental Status: She is alert and oriented to person, place, and time.  Psychiatric:  Mood and Affect: Mood normal.  Behavior: Behavior normal.    Assessment:   Low-grade DCIS of the left breast.  Mild anemia, normochromic.  Plan:   Options for management reviewed: 1) wide excision versus 2) wide excision with radiation (standard of care) versus 3) mastectomy.  The patient's husband had lymphoma and received radiation and she felt that he had "too much" leading to his early demise. In spite of this, she is not adverse to the idea of wide excision and postoperative radiation.  ER status not reported on the biopsy sample, possibly, likely a candidate for estrogen blockade once this information is available.  The patient is meeting with medical oncology tomorrow for their input. At present, no indication for chemotherapy.  This note is partially prepared by Karie Fetch, RN, acting as a scribe in the presence of Dr. Hervey Ard, MD.  The documentation recorded by the scribe accurately reflects the service I personally performed and the  decisions made by me.   Robert Bellow, MD FACS  Electronically signed by Mayer Masker, MD at 06/16/2022 4:52 PM EDT

## 2022-06-17 NOTE — Progress Notes (Signed)
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 Tressia Miners  Date of visit: 06/17/22   History of presenting illness-patient is a 76 year old female who underwent a routine screening marrow gram in July 2023 which showed suspicious calcifications in her left breast.  This was followed by diagnostic mammogram which showed 10 mm group of calcifications in the upper outer quadrant of the left breast.  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  Patient has met with Dr. Bary Castilla and will be undergoing lumpectomy at some point next month.  She is otherwise doing well for her age.  There is a family history of breast cancer in 2 of her sisters.  Menarche at the age of 19.  She is G3 P3.  Age at first birth 85.  She is not presently on hormone replacement therapy.  No prior history of breast biopsies.  ECOG PS- 0  Pain scale- 0   Review of systems- Review of Systems  Constitutional:  Negative for chills, fever, malaise/fatigue and weight loss.  HENT:  Negative for congestion, ear discharge and nosebleeds.   Eyes:  Negative for blurred vision.  Respiratory:  Negative for cough, hemoptysis, sputum production, shortness of breath and wheezing.   Cardiovascular:  Negative for chest pain, palpitations, orthopnea and claudication.  Gastrointestinal:  Negative for abdominal pain, blood in stool, constipation, diarrhea, heartburn, melena, nausea and vomiting.  Genitourinary:  Negative for dysuria, flank pain, frequency, hematuria and urgency.  Musculoskeletal:  Negative for back pain, joint pain and myalgias.  Skin:  Negative for rash.  Neurological:  Negative for  dizziness, tingling, focal weakness, seizures, weakness and headaches.  Endo/Heme/Allergies:  Does not bruise/bleed easily.  Psychiatric/Behavioral:  Negative for depression and suicidal ideas. The patient does not have insomnia.     Allergies  Allergen Reactions   Hydrocodone-Acetaminophen Itching    Can take if she takes benadryl with it.      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   Complex tear of medial meniscus of right knee as current injury 03/04/2020   Chronic pain of both knees 09/19/2019   Insomnia 02/27/2019   Lumbar degenerative disc disease 08/09/2018   Anxiety 01/19/2018   Controlled substance agreement signed 01/19/2018   H/O bilateral hip replacements 81/44/8185   Synovial plica syndrome of right knee 11/23/2017   Excessive daytime sleepiness 04/14/2017   Fibromyalgia 01/11/2017   Vitamin D deficiency 01/11/2017   Chronic coronary artery disease 01/18/2015   Hyperlipidemia 01/18/2015   Hypertension 01/18/2015   Mitral valve prolapse 01/18/2015   Chalazion 12/05/2013   Dermatochalasis 11/08/2012     Past Medical History:  Diagnosis Date   Anemia    Arthritis    Complication of anesthesia    a.) delayed emergence with routine colonoscopy; "I was given too much medicine". b.) PONV. c.) postoperative delirium; hallucinations.   Coronary artery calcification seen on CT scan    a.) Coronary CTA 11/03/2011: calcium score 0. b.) LHC 01/03/2013: normal coronary anatomy. c.) CTA 04/04/2021: mild coronary artery calcifications.   Dysrhythmia    Endometriosis    Fibromyalgia    GERD (gastroesophageal reflux disease)  Hearing impaired    Hiatal hernia    History of hiatal hernia    HLD (hyperlipidemia)    HTN (hypertension)    IBS (irritable bowel syndrome)    Mitral valve prolapse    Palpitations    PONV (postoperative nausea and vomiting)    Pulmonary HTN  ([place]) 08/21/2020   a.) TTE 08/21/2020: mild elevated PASP of 38 mmHg.   Skin cancer    SOB (shortness of breath)      Past Surgical History:  Procedure Laterality Date   ABDOMINAL HYSTERECTOMY     ANTERIOR CERVICAL DECOMP/DISCECTOMY FUSION N/A 12/03/2021   Procedure: C6-7 ANTERIOR CERVICAL DISCECTOMY AND FUSION (GLOBUS HEDRON);  Surgeon: Meade Maw, MD;  Location: [facility] ORS;  Service: Neurosurgery;  Laterality: N/A;   APPENDECTOMY     BACK SURGERY     cervical fusion and at tailbone   BLEPHAROPLASTY Bilateral    BREAST BIOPSY Left 06/08/2022   stereo calcs coil clip path pending   CATARACT EXTRACTION, BILATERAL     CHOLECYSTECTOMY     COLONOSCOPY W/ POLYPECTOMY     COLONOSCOPY WITH ESOPHAGOGASTRODUODENOSCOPY (EGD)     KYPHOPLASTY     LEFT HEART CATH AND CORONARY ANGIOGRAPHY Left 01/03/2013   Procedure: LEFT HEART CATH AND CORONARY ANGIOGRAPHY; Location: [facility]; Surgeon: Kathe Mariner, MD   ORIF WRIST FRACTURE Left 11/18/2020   Procedure: OPEN REDUCTION INTERNAL FIXATION OF LEFT DISTAL RADIUS FRACTURE;  Surgeon: Dereck Leep, MD;  Location: [facility] OR[facility];  Service: Orthopedics;  Laterality: Left;   TOTAL HIP ARTHROPLASTY Bilateral     Social History   Socioeconomic History   Marital status: Widowed    Spouse name: Not on file   Number of children: Not on file   Years of education: Not on file   Highest education level: Not on file  Occupational History   Not on file  Tobacco Use   Smoking status: Never   Smokeless tobacco: Never  Vaping Use   Vaping Use: Never used  Substance and Sexual Activity   Alcohol use: Yes    Comment: occasional wine   Drug use: Never   Sexual activity: Not on file  Other Topics Concern   Not on file  Social History Narrative   Not on file   Social Determinants of Health   Financial Resource Strain: Not on file  Food Insecurity: Not on file  Transportation Needs: Not on file  Physical Activity: Not on file  Stress: Not on file   Social Connections: Not on file  Intimate Partner Violence: Not on file     Family History  Problem Relation Age of Onset   Breast cancer Sister        2 sisters   Breast cancer Sister    Lymphoma Brother    Breast cancer Maternal Aunt    Cervical cancer Paternal Aunt    Cervical cancer Paternal Aunt    Breast cancer Cousin        mat cousins   Cervical cancer Niece      Current Outpatient Medications:    acetaminophen (TYLENOL) 500 MG tablet, Take 1,000 mg by mouth every 6 (six) hours as needed., Disp: , Rfl:    amitriptyline (ELAVIL) 25 MG tablet, Take by mouth., Disp: , Rfl:    aspirin EC 81 MG tablet, Take by mouth., Disp: , Rfl:    calcium carbonate (TUMS - DOSED IN MG ELEMENTAL CALCIUM) 500 MG chewable tablet, Chew 500 mg by mouth daily as needed  for indigestion or heartburn., Disp: , Rfl:    Dietary Management Product (VB6 P5P PO), Take 1 capsule by mouth daily., Disp: , Rfl:    magnesium oxide (MAG-OX) 400 MG tablet, Take 400 mg by mouth daily., Disp: , Rfl:    Melatonin 1 MG CAPS, Take 1 mg by mouth at bedtime., Disp: , Rfl:    metoprolol succinate (TOPROL-XL) 25 MG 24 hr tablet, Take 25 mg by mouth every evening., Disp: , Rfl:    rosuvastatin (CRESTOR) 5 MG tablet, Take 5 mg by mouth daily., Disp: , Rfl:    topiramate (TOPAMAX) 50 MG tablet, Take 50 mg by mouth 2 (two) times daily., Disp: , Rfl:    Vitamin D-Vitamin K (K2 PLUS D3 PO), Take 1 tablet by mouth daily., Disp: , Rfl:    methocarbamol (ROBAXIN) 500 MG tablet, Take 1 tablet (500 mg total) by mouth 3 (three) times daily as needed for muscle spasms. (Patient not taking: Reported on 06/17/2022), Disp: 90 tablet, Rfl: 0   RESTASIS 0.05 % ophthalmic emulsion, Place 1 drop into both eyes 2 (two) times daily. (Patient not taking: Reported on 06/17/2022), Disp: , Rfl:    Physical exam:  Vitals:   06/17/22 1339  BP: 116/68  Pulse: 86  Resp: 18  Temp: 97.7 F (36.5 C)  SpO2: 99%  Weight: 189 lb 11.2 oz (86 kg)    Physical Exam Constitutional:      General: She is not in acute distress. Cardiovascular:     Rate and Rhythm: Normal rate and regular rhythm.     Heart sounds: Normal heart sounds.  Pulmonary:     Effort: Pulmonary effort is normal.     Breath sounds: Normal breath sounds.  Abdominal:     General: Bowel sounds are normal.     Palpations: Abdomen is soft.  Skin:    General: Skin is warm and dry.  Neurological:     Mental Status: She is alert and oriented to person, place, and time.   Breast exam: No palpable masses in either breast.  No palpable bilateral axillary adenopathy.  Bruising and induration noted at the site of recent left breast biopsy.       Latest Ref Rng & Units 04/04/2021   11:29 AM  CMP  Glucose 70 - 99 mg/dL 117   BUN 8 - 23 mg/dL 15   Creatinine 0.44 - 1.00 mg/dL 0.65   Sodium 135 - 145 mmol/L 138   Potassium 3.5 - 5.1 mmol/L 4.3   Chloride 98 - 111 mmol/L 107   CO2 22 - 32 mmol/L 25   Calcium 8.9 - 10.3 mg/dL 8.8       Latest Ref Rng & Units 04/04/2021   11:29 AM  CBC  WBC 4.0 - 10.5 K/uL 6.0   Hemoglobin 12.0 - 15.0 g/dL 11.1   Hematocrit 36.0 - 46.0 % 33.7   Platelets 150 - 400 K/uL 240     No images are attached to the encounter.  MM LT BREAST BX W LOC DEV 1ST LESION IMAGE BX SPEC STEREO GUIDE  Addendum Date: 06/09/2022   ADDENDUM REPORT: 06/09/2022 13:25 ADDENDUM: PATHOLOGY revealed: A BREAST, LEFT, POSTERIOR OUTER QUADRANT; STEREOTACTIC CORE BIOPSIES: - LOW-GRADE DUCTAL CARCINOMA IN SITU. - NO EVIDENCE OF INVASION. - CALCIFICATIONS IDENTIFIED. Pathology results are CONCORDANT with imaging findings, per Dr. Valentino Saxon. Pathology results and recommendations were discussed with patient via telephone on 06/09/2022. Patient reported biopsy site doing well with no adverse symptoms, and only  slight tenderness at the site. Post biopsy care instructions were reviewed, questions were answered and my direct phone number was provided. Patient was  instructed to call [facility] for any additional questions or concerns related to biopsy site. RECOMMENDATIONS: Surgical consultation. Request for surgical consultation relayed to Casper Harrison RN at [facility] by Electa Sniff RN on 06/09/2022. Pathology results reported by Electa Sniff RN on 06/09/2022. Electronically Signed   By: Valentino Saxon M.D.   On: 06/09/2022 13:25   Result Date: 06/09/2022 CLINICAL DATA:  Indeterminate calcifications EXAM: LEFT BREAST STEREOTACTIC CORE NEEDLE BIOPSY COMPARISON:  Previous exam(s). FINDINGS: The patient and I discussed the procedure of stereotactic-guided biopsy including benefits and alternatives. We discussed the high likelihood of a successful procedure. We discussed the risks of the procedure including infection, bleeding, tissue injury, clip migration, and inadequate sampling. Informed written consent was given. The usual time out protocol was performed immediately prior to the procedure. Using sterile technique and 1% lidocaine and 1% lidocaine with epinephrine as local anesthetic, under stereotactic guidance, a 9 gauge vacuum assisted device was used to perform core needle biopsy of calcifications in the outer left breast using a lateral approach. Specimen radiograph was performed showing representative calcifications in 4 of 6 samples. Specimens with calcifications are identified for pathology. Lesion quadrant: Upper outer quadrant At the conclusion of the procedure, a COIL shaped tissue marker clip was deployed into the biopsy cavity. Follow-up 2-view mammogram was performed and dictated separately. IMPRESSION: Stereotactic-guided biopsy of indeterminate calcifications. No apparent complications. Electronically Signed: By: Valentino Saxon M.D. On: 06/08/2022 09:25   MM CLIP PLACEMENT LEFT  Result Date: 06/08/2022 CLINICAL DATA:  Status post stereotactic guided biopsy EXAM: 3D DIAGNOSTIC LEFT MAMMOGRAM POST stereotactic BIOPSY  COMPARISON:  Previous exam(s). FINDINGS: 3D Mammographic images were obtained following stereotactic guided biopsy of calcifications. The COIL biopsy marking clip is minimally displaced laterally from site of biopsy by approximately 4 mm. IMPRESSION: Minimal lateral displacement of COIL shaped biopsy marking clip from site of biopsy. Final Assessment: Post Procedure Mammograms for Marker Placement Electronically Signed   By: Valentino Saxon M.D.   On: 06/08/2022 09:24  DG Cervical Spine 2 or 3 views  Result Date: 05/31/2022 CLINICAL DATA:  Cervical fusion, neck pain EXAM: CERVICAL SPINE - 2-3 VIEW COMPARISON:  CT cervical spine done on 02/18/2022 FINDINGS: There is anterior surgical fusion from C3-C7 levels. No recent fracture is seen. Osteopenia is seen in bony structures. Degenerative changes are noted with facet hypertrophy at multiple levels, more so in the lower cervical spine. Prevertebral soft tissues are unremarkable. IMPRESSION: Previous anterior fusion from C3-C7 levels. No recent fracture is seen. Cervical spondylosis. Electronically Signed   By: Elmer Picker M.D.   On: 05/31/2022 15:00   MM DIAG BREAST TOMO UNI LEFT  Result Date: 05/19/2022 CLINICAL DATA:  76 year old female presenting for screening recall for left breast calcifications. EXAM: DIGITAL DIAGNOSTIC UNILATERAL LEFT MAMMOGRAM WITH TOMOSYNTHESIS TECHNIQUE: Left digital diagnostic mammography and breast tomosynthesis was performed. COMPARISON:  Previous exam(s). ACR Breast Density Category b: There are scattered areas of fibroglandular density. FINDINGS: There is a 10 mm group of amorphous calcifications in the upper-outer left breast middle depth. These calcifications are not definitely stable compared to priors. IMPRESSION: Indeterminate 10 mm group of amorphous calcifications in the upper-outer left breast. RECOMMENDATION: Stereotactic guided biopsy for left breast calcifications. I have discussed the findings and  recommendations with the patient. If applicable, a reminder letter will be sent to  the patient regarding the next appointment. BI-RADS CATEGORY  4: Suspicious. Electronically Signed   By: Beryle Flock M.D.   On: 05/19/2022 16:13   Assessment and plan- Patient is a 76 y.o. female with newly diagnosed low-grade left breast DCIS here to discuss further management  I discussed the results of mammogram and pathology with the patient in detail which shows a low-grade left breast DCIS.  I recommend upfront lumpectomy at this time which will be coordinated by Dr. Bary Castilla.  Discussed differences between DCIS and invasive cancer.  There would be no role for chemotherapy in DCIS.  As per [facility] DCIS algorithm if this turns out to be a low-grade DCIS and no invasive component with negative margins for risk of 5-year and 10-year recurrence with surgery alone is 7 and 11%.  Will be brought down to 3 and 4% with adjuvant radiation therapy and further down to 2 and 1% with adjuvant endocrine therapy should she have estrogen positive DCIS.  Patient is leaning towards proceeding with adjuvant radiation following DCIS but may decide to forego hormone therapy.  She is agreeable to obtaining a baseline bone density scan at this time.  I will also refer her to genetic counseling.  I will see her back after final pathology results are back   Cancer Staging  Ductal carcinoma in situ (DCIS) of left breast Staging form: Breast, AJCC 8th Edition - Clinical stage from 06/17/2022: Stage 0 (cTis (DCIS), cN0, cM0) - Signed by Sindy Guadeloupe, MD on 06/17/2022     Thank you for this kind referral and the opportunity to participate in the care of this patient   Visit Diagnosis 1. Ductal carcinoma in situ (DCIS) of left breast   2. Post-menopausal     Dr. Randa Evens, MD, MPH [facility] [phone] 06/17/2022

## 2022-06-17 NOTE — Progress Notes (Signed)
Accompanied patient to initial medical oncology appointment.   Reviewed Breast Cancer treatment handbook.   Care plan summary given to patient.   Reviewed outreach programs and cancer center services.  

## 2022-06-17 NOTE — Research (Addendum)
Trial: MTG-015 - Tissue and Bodily Fluids: Translational Medicine: Discovery and Evaluation of Biomarkers/Pharmacogenomics for the Diagnosis and Personalized Management of Patients    Patient Noreta Kue was identified by this nurse as a potential candidate for the above listed study.  This Clinical Research Nurse met with [name], [id number], on 06/17/22 in a manner and location that ensures patient privacy to discuss participation in the above listed research study.  Patient is Unaccompanied.  A copy of the informed consent document and separate HIPAA Authorization was provided to the patient.  Patient reads, speaks, and understands [place].    Patient was provided with the business card of this Nurse and encouraged to contact the research team with any questions.  Patient was provided the option of taking informed consent documents home to review and was encouraged to review at their convenience with their support network, including other care providers. Patient is comfortable with making a decision regarding study participation today.  As outlined in the informed consent form, this Nurse and [name] discussed the purpose of the research study, the investigational nature of the study, study procedures and requirements for study participation, potential risks and benefits of study participation, as well as alternatives to participation. This study is not blinded. The patient understands participation is voluntary and they may withdraw from study participation at any time.  This study does not involve randomization.  This study does not involve an investigational drug or device. This study does not involve a placebo. Patient understands enrollment is pending full eligibility review.   Confidentiality and how the patient's information will be used as part of study participation were discussed.  Patient was informed there is reimbursement provided for their time and effort spent on  trial participation.  The patient is encouraged to discuss research study participation with their insurance provider to determine what costs they may incur as part of study participation, including research related injury.    All questions were answered to patient's satisfaction.  The informed consent and separate HIPAA Authorization was reviewed page by page.  The patient's mental and emotional status is appropriate to provide informed consent, and the patient verbalizes an understanding of study participation.  Patient has agreed to participate in the above listed research study and has voluntarily signed the informed consent dated 08/18/2021 version 7 and separate HIPAA Authorization, version 5  on 06/17/22 at 218PM.  The patient was provided with a copy of the signed informed consent form and separate HIPAA Authorization for their reference.  No study specific procedures were obtained prior to the signing of the informed consent document.  Approximately 20 minutes were spent with the patient reviewing the informed consent documents.  After obtaining informed consent patient, voluntarily signed the optional Release of Information form for use throughout trial participation.  Patient completed her [facility] questions worksheet, she denies every having HIV / AIDS, Hepatitis A, B or C.   The following social, medical, and family history was collected from the patient on 06/17/22.  Date and time of last meal prior to blood collection: 06/17/2022 at 0930 am   Does the patient drink alcohol? No  Does the patient use tobacco products? No  Menopausal status? postmenopausal  Date of last menstrual cycle? 51 years ago, she had a hysterectomy at 76 yo due to endometriosis.   Does the patient have a personal history of cancer? No   If yes, what type and dates of diagnosis/treatment: N/A   Does the patient have family history  of cancer in immediate family (grandparents, parents, and/or siblings)? Yes   If  yes, what was the relationship, cancer type, date of diagnosis, and outcome for each? Sister #1 11-28-2019 27-Nov-2020 in follow up after her treatment, Sister # 2 Breast metastatic in 27-Nov-2004 now deceased due to a heart attack  Has the patient received a COVID-19 vaccination? No   If yes, vaccine manufacturer? N/A  Number of doses received? 0  Date of last dose? N/A  Has the patient ever tested positive for COVID? Yes   If yes, date of last positive COVID test? November 27, 2020  Date of last COVID test taken? 11/27/2021  Has the patient received any other vaccines in the past year? No   If yes, which vaccines and dates? N/A  The patient's medication list was reviewed with the patient and it was correct and start dates were verified. Has the patient stopped any medications in the past 30 days? No   Patient was escorted to the lab by Mauricio Po, [place] for her study lab draw. Gift card was provided to the patient for $50 after her blood was obtained by [place]. Study labs were drawn by Priscille Heidelberg without any difficulty.  Jeral Fruit, RN 06/17/22 2:52 PM    This Nurse has reviewed this patient's inclusion and exclusion criteria as a second review and confirms Cecil Bixby is eligible for study participation.  Patient may continue with enrollment.   Maxwell Marion, RN, BSN, [facility] Clinical Research Nurse Lead 06/17/2022 3:34 PM

## 2022-06-18 ENCOUNTER — Other Ambulatory Visit: Payer: Self-pay | Admitting: General Surgery

## 2022-06-18 ENCOUNTER — Encounter: Payer: Self-pay | Admitting: *Deleted

## 2022-06-18 DIAGNOSIS — D0510 Intraductal carcinoma in situ of unspecified breast: Secondary | ICD-10-CM

## 2022-06-18 NOTE — Progress Notes (Signed)
Surgery has been scheduled for 10/6.  Patient set up to see Dr. Janese Banks and Dr. Baruch Gouty on 10/18.  Appt. Details given to her.

## 2022-06-19 ENCOUNTER — Other Ambulatory Visit: Payer: Self-pay

## 2022-06-19 DIAGNOSIS — D0512 Intraductal carcinoma in situ of left breast: Secondary | ICD-10-CM

## 2022-06-24 ENCOUNTER — Ambulatory Visit
Admission: RE | Admit: 2022-06-24 | Discharge: 2022-06-24 | Disposition: A | Discharge status: Home / Self Care | Payer: Medicare Other | Source: Ambulatory Visit | Attending: Oncology | Admitting: Oncology

## 2022-06-24 DIAGNOSIS — D0512 Intraductal carcinoma in situ of left breast: Secondary | ICD-10-CM | POA: Diagnosis not present

## 2022-06-24 DIAGNOSIS — Z780 Asymptomatic menopausal state: Secondary | ICD-10-CM | POA: Diagnosis present

## 2022-06-25 ENCOUNTER — Encounter
Admission: RE | Admit: 2022-06-25 | Discharge: 2022-06-25 | Disposition: A | Discharge status: Home / Self Care | Payer: Medicare Other | Source: Ambulatory Visit | Attending: General Surgery | Admitting: General Surgery

## 2022-06-25 ENCOUNTER — Other Ambulatory Visit: Payer: Self-pay

## 2022-06-25 HISTORY — DX: Other fracture of left lower leg, initial encounter for closed fracture: S82.892A

## 2022-06-25 NOTE — Patient Instructions (Addendum)
Your procedure is scheduled on: July 03, 2022 FRIDAY  Report to [facility] Locations: [facility] ([place] [address]- [facility] building) at 8:30AM. Please wear a two piece outfit and a shirt that buttons down the front. Please have your driver remain with you while at [facility] After you are finished at [facility], you will check in at the [facility] entrance, Same Day Surgery-2nd floor.     REMEMBER: Instructions that are not followed completely may result in serious medical risk, up to and including death; or upon the discretion of your surgeon and anesthesiologist your surgery may need to be rescheduled.  Do not eat food after midnight the night before surgery.  No gum chewing, lozengers or hard candies.  You may however, drink CLEAR liquids up to 2 hours before you are scheduled to arrive for your surgery. Do not drink anything within 2 hours of your scheduled arrival time.  Clear liquids include: - water  - apple juice without pulp - gatorade (not RED colors) - black coffee or tea (Do NOT add milk or creamers to the coffee or tea) Do NOT drink anything that is not on this list.  TAKE THESE MEDICATIONS THE MORNING OF SURGERY WITH A SIP OF WATER: NONE  LAST DOSE OF ASPIRIN June 27, 2022 SATURDAY  One week prior to surgery: Stop Anti-inflammatories (NSAIDS) such as Advil, Aleve, Ibuprofen, Motrin, Naproxen, Naprosyn and Aspirin based products such as Excedrin, Goodys Powder, BC Powder. Stop ANY OVER THE COUNTER supplements until after surgery. You may however, continue to take Tylenol if needed for pain up until the day of surgery.  No Alcohol for 24 hours before or after surgery.  No Smoking including e-cigarettes for 24 hours prior to surgery.  No chewable tobacco products for at least 6 hours prior to surgery.  No nicotine patches on the day of surgery.  Do not use any "recreational" drugs for at least  a week prior to your surgery.  Please be advised that the combination of cocaine and anesthesia may have negative outcomes, up to and including death. If you test positive for cocaine, your surgery will be cancelled.  On the morning of surgery brush your teeth with toothpaste and water, you may rinse your mouth with mouthwash if you wish. Do not swallow any toothpaste or mouthwash.  Use CHG Soap as directed on instruction sheet.  Do not wear jewelry, make-up, hairpins, clips or nail polish.  Do not wear lotions, powders, or perfumes OR DEODORANT    Do not shave body from the neck down 48 hours prior to surgery just in case you cut yourself which could leave a site for infection.  Also, freshly shaved skin may become irritated if using the CHG soap.  Contact lenses, hearing aids and dentures may not be worn into surgery.  Do not bring valuables to the hospital. [facility] is not responsible for any missing/lost belongings or valuables.   Notify your doctor if there is any change in your medical condition (cold, fever, infection).  Wear comfortable clothing (specific to your surgery type) to the hospital.  After surgery, you can help prevent lung complications by doing breathing exercises.  Take deep breaths and cough every 1-2 hours. Your doctor may order a device called an Incentive Spirometer to help you take deep breaths. When coughing or sneezing, hold a pillow firmly against your incision with both hands. This is called "splinting." Doing this helps protect  your incision. It also decreases belly discomfort.  If you are being discharged the day of surgery, you will not be allowed to drive home. You will need a responsible adult (18 years or older) to drive you home and stay with you that night.   If you are taking public transportation, you will need to have a responsible adult (18 years or older) with you. Please confirm with your physician that it is acceptable to use public  transportation.   Please call the [place] Dept. at [phone] if you have any questions about these instructions.  Surgery Visitation Policy:  Patients undergoing a surgery or procedure may have two family members or support persons with them as long as the person is not COVID-19 positive or experiencing its symptoms.

## 2022-06-30 ENCOUNTER — Inpatient Hospital Stay: Payer: Medicare Other

## 2022-06-30 ENCOUNTER — Encounter: Payer: Self-pay | Admitting: Licensed Clinical Social Worker

## 2022-06-30 ENCOUNTER — Inpatient Hospital Stay: Payer: Medicare Other | Attending: Oncology | Admitting: Licensed Clinical Social Worker

## 2022-06-30 DIAGNOSIS — Z807 Family history of other malignant neoplasms of lymphoid, hematopoietic and related tissues: Secondary | ICD-10-CM | POA: Diagnosis not present

## 2022-06-30 DIAGNOSIS — D0512 Intraductal carcinoma in situ of left breast: Secondary | ICD-10-CM | POA: Diagnosis not present

## 2022-06-30 DIAGNOSIS — Z85828 Personal history of other malignant neoplasm of skin: Secondary | ICD-10-CM | POA: Insufficient documentation

## 2022-06-30 DIAGNOSIS — Z803 Family history of malignant neoplasm of breast: Secondary | ICD-10-CM | POA: Diagnosis not present

## 2022-06-30 DIAGNOSIS — M85831 Other specified disorders of bone density and structure, right forearm: Secondary | ICD-10-CM | POA: Insufficient documentation

## 2022-06-30 DIAGNOSIS — Z8049 Family history of malignant neoplasm of other genital organs: Secondary | ICD-10-CM | POA: Diagnosis not present

## 2022-06-30 DIAGNOSIS — Z90710 Acquired absence of both cervix and uterus: Secondary | ICD-10-CM | POA: Insufficient documentation

## 2022-06-30 DIAGNOSIS — I10 Essential (primary) hypertension: Secondary | ICD-10-CM | POA: Insufficient documentation

## 2022-06-30 DIAGNOSIS — Z800 Family history of malignant neoplasm of digestive organs: Secondary | ICD-10-CM | POA: Insufficient documentation

## 2022-06-30 NOTE — Progress Notes (Signed)
REFERRING PROVIDER: Sindy Guadeloupe, MD [place],  [place] [address]  PRIMARY PROVIDER:  Gladstone Lighter, MD  PRIMARY REASON FOR VISIT:  1. Ductal carcinoma in situ (DCIS) of left breast   2. Family history of breast cancer   3. Family history of cervical cancer   4. Family history of lymphoma      HISTORY OF PRESENT ILLNESS:   Suzanne Escobar, a 76 y.o. female, was seen for a [place] cancer genetics consultation at the request of Dr. Janese Banks due to a personal and family history of breast cancer.  Suzanne Escobar presents to clinic today to discuss the possibility of a hereditary predisposition to cancer, genetic testing, and to further clarify her future cancer risks, as well as potential cancer risks for family members.   CANCER HISTORY:  Oncology History  Ductal carcinoma in situ (DCIS) of left breast  06/17/2022 Initial Diagnosis   Ductal carcinoma in situ (DCIS) of left breast   06/17/2022 Cancer Staging   Staging form: Breast, AJCC 8th Edition - Clinical stage from 06/17/2022: Stage 0 (cTis (DCIS), cN0, cM0) - Signed by Sindy Guadeloupe, MD on 06/17/2022    In 2023, at the age of 67, Suzanne Escobar was diagnosed with DCIS of the left breast. The treatment plan includes lumpectomy scheduled for 10/6 and radiation.   RISK FACTORS:  Menarche was at age 18.  First live birth at age 24.  OCP use for a short time. Ovaries intact: no.  Hysterectomy: yes.  Menopausal status: postmenopausal.  HRT use: 0 years. Colonoscopy: yes; normal. Mammogram within the last year: yes.  Past Medical History:  Diagnosis Date   Anemia    Ankle fracture, left    Arthritis    Complication of anesthesia    a.) delayed emergence with routine colonoscopy; "I was given too much medicine". b.) PONV. c.) postoperative delirium; hallucinations.   Coronary artery calcification seen on CT scan    a.) Coronary CTA 11/03/2011: calcium score 0. b.) LHC 01/03/2013: normal coronary anatomy.  c.) CTA 04/04/2021: mild coronary artery calcifications.   Dysrhythmia    Endometriosis    Fibromyalgia    GERD (gastroesophageal reflux disease)    Hearing impaired    Hiatal hernia    History of hiatal hernia    HLD (hyperlipidemia)    HTN (hypertension)    IBS (irritable bowel syndrome)    Mitral valve prolapse    Palpitations    PONV (postoperative nausea and vomiting)    Pulmonary HTN ([place]) 08/21/2020   a.) TTE 08/21/2020: mild elevated PASP of 38 mmHg.   Skin cancer    SOB (shortness of breath)     Past Surgical History:  Procedure Laterality Date   ABDOMINAL HYSTERECTOMY     ANTERIOR CERVICAL DECOMP/DISCECTOMY FUSION N/A 12/03/2021   Procedure: C6-7 ANTERIOR CERVICAL DISCECTOMY AND FUSION (GLOBUS HEDRON);  Surgeon: Meade Maw, MD;  Location: [facility] ORS;  Service: Neurosurgery;  Laterality: N/A;   APPENDECTOMY     BACK SURGERY     cervical fusion and at tailbone   BLEPHAROPLASTY Bilateral    BREAST BIOPSY Left 06/08/2022   stereo calcs coil clip path pending   CARDIAC CATHETERIZATION     X 3   CATARACT EXTRACTION, BILATERAL     CHOLECYSTECTOMY     COLONOSCOPY W/ POLYPECTOMY     COLONOSCOPY WITH ESOPHAGOGASTRODUODENOSCOPY (EGD)     FOOT SURGERY Bilateral    KYPHOPLASTY     LAPAROSCOPY  OOPHERECTOMY   LEFT HEART CATH AND CORONARY ANGIOGRAPHY Left 01/03/2013   Procedure: LEFT HEART CATH AND CORONARY ANGIOGRAPHY; Location: [facility]; Surgeon: Kathe Mariner, MD   ORIF WRIST FRACTURE Left 11/18/2020   Procedure: OPEN REDUCTION INTERNAL FIXATION OF LEFT DISTAL RADIUS FRACTURE;  Surgeon: Dereck Leep, MD;  Location: [facility] ORS;  Service: Orthopedics;  Laterality: Left;   SEPTOPLASTY     SHOULDER ARTHROSCOPY DISTAL CLAVICLE EXCISION AND OPEN ROTATOR CUFF REPAIR Bilateral    X2   TOTAL HIP ARTHROPLASTY Bilateral     FAMILY HISTORY:  We obtained a detailed, 4-generation family history.  Significant diagnoses are listed below: Family History  Problem Relation  Age of Onset   Breast cancer Sister        d. 78s; metastatic   Breast cancer Sister        dx 40   Lymphoma Brother    Breast cancer Maternal Aunt        dx twice   Throat cancer Paternal Aunt    Cervical cancer Paternal Aunt    Breast cancer Cousin        maternal cousin   Cervical cancer Niece    Suzanne Escobar has 2 daughters, 78 and 23, and 1 son. 53. She had 12 siblings. One sister died of metastatic breast cancer in her 70s. Her daughter, patient's niece, has cervical cancer. Another sister had breast cancer at 3 in three different places, is living at 53.   Suzanne Escobar mother died at 58. Patient had 1 full maternal uncle and several half maternal aunts/uncles (through patient's grandfather). One aunt had breast cancer twice and her daughter also had breast cancer. Another maternal cousin has had cancer, unknown type.   Suzanne Escobar father passed at 49 due to heart attack. Patient had 12 paternal aunts/uncles. One aunt had cervical cancer and another aunt had throat cancer.  Suzanne Escobar is unaware of previous family history of genetic testing for hereditary cancer risks. There is no reported Ashkenazi Jewish ancestry. There is no known consanguinity.    GENETIC COUNSELING ASSESSMENT: Suzanne Escobar is a 76 y.o. female with a personal and family history of breast cancer which is somewhat suggestive of a hereditary cancer syndrome and predisposition to cancer. We, therefore, discussed and recommended the following at today's visit.   DISCUSSION: We discussed that approximately 10% of breast cancer is hereditary. Most cases of hereditary breast cancer are associated with BRCA1/BRCA2 genes, although there are other genes associated with hereditary cancer as well. Cancers and risks are gene specific. We discussed that testing is beneficial for several reasons including knowing about cancer risks, identifying potential screening and risk-reduction options that may be  appropriate, and to understand if other family members could be at risk for cancer and allow them to undergo genetic testing.   We reviewed the characteristics, features and inheritance patterns of hereditary cancer syndromes. We also discussed genetic testing, including the appropriate family members to test, the process of testing, insurance coverage and turn-around-time for results. We discussed the implications of a negative, positive and/or variant of uncertain significant result. We recommended Suzanne Escobar pursue genetic testing for the [facility] Common Hereditary Cancers+RNA gene panel.   Based on Suzanne Escobar's personal and family history of cancer, she meets medical criteria for genetic testing. Despite that she meets criteria, she may still have an out of pocket cost. We discussed that if her out of pocket cost for testing is over $100, the laboratory will call and confirm  whether she wants to proceed with testing.  If the out of pocket cost of testing is less than $100 she will be billed by the genetic testing laboratory.   PLAN: After considering the risks, benefits, and limitations, Suzanne Escobar provided informed consent to pursue genetic testing and the blood sample was sent to [facility] for analysis of the Common Hereditary Cancers+RNA panel. Results should be available within approximately 2-3 weeks' time, at which point they will be disclosed by telephone to Suzanne Escobar, as will any additional recommendations warranted by these results. Suzanne Escobar will receive a summary of her genetic counseling visit and a copy of her results once available. This information will also be available in Epic.   Suzanne Escobar questions were answered to her satisfaction today. Our contact information was provided should additional questions or concerns arise. Thank you for the referral and allowing [place] to share in the care of your patient.   Faith Rogue, MS, [facility] Genetic  Counselor [email or web address] Phone: [phone]  The patient was seen for a total of 30 minutes in face-to-face genetic counseling.  Dr. Grayland Ormond was available for discussion regarding this case.   _______________________________________________________________________ For Office Staff:  Number of people involved in session: 1 Was an Intern/ student involved with case: no

## 2022-07-03 ENCOUNTER — Encounter
Admission: RE | Disposition: A | Discharge status: Home / Self Care | Payer: Self-pay | Source: Home / Self Care | Attending: General Surgery

## 2022-07-03 ENCOUNTER — Ambulatory Visit
Admission: RE | Admit: 2022-07-03 | Discharge: 2022-07-03 | Disposition: A | Discharge status: Home / Self Care | Payer: Medicare Other | Source: Ambulatory Visit | Attending: General Surgery | Admitting: General Surgery

## 2022-07-03 ENCOUNTER — Other Ambulatory Visit: Payer: Self-pay

## 2022-07-03 ENCOUNTER — Ambulatory Visit
Admission: RE | Admit: 2022-07-03 | Discharge: 2022-07-03 | Disposition: A | Discharge status: Home / Self Care | Payer: Medicare Other | Attending: General Surgery | Admitting: General Surgery

## 2022-07-03 ENCOUNTER — Ambulatory Visit: Payer: Medicare Other | Admitting: Registered Nurse

## 2022-07-03 ENCOUNTER — Ambulatory Visit: Payer: Medicare Other | Admitting: Urgent Care

## 2022-07-03 ENCOUNTER — Encounter: Payer: Self-pay | Admitting: General Surgery

## 2022-07-03 DIAGNOSIS — I2510 Atherosclerotic heart disease of native coronary artery without angina pectoris: Secondary | ICD-10-CM | POA: Diagnosis not present

## 2022-07-03 DIAGNOSIS — D649 Anemia, unspecified: Secondary | ICD-10-CM | POA: Diagnosis not present

## 2022-07-03 DIAGNOSIS — I499 Cardiac arrhythmia, unspecified: Secondary | ICD-10-CM | POA: Insufficient documentation

## 2022-07-03 DIAGNOSIS — D759 Disease of blood and blood-forming organs, unspecified: Secondary | ICD-10-CM | POA: Insufficient documentation

## 2022-07-03 DIAGNOSIS — I10 Essential (primary) hypertension: Secondary | ICD-10-CM | POA: Insufficient documentation

## 2022-07-03 DIAGNOSIS — D0510 Intraductal carcinoma in situ of unspecified breast: Secondary | ICD-10-CM

## 2022-07-03 DIAGNOSIS — D0512 Intraductal carcinoma in situ of left breast: Secondary | ICD-10-CM | POA: Diagnosis present

## 2022-07-03 HISTORY — PX: BREAST LUMPECTOMY WITH NEEDLE LOCALIZATION: SHX5759

## 2022-07-03 SURGERY — BREAST LUMPECTOMY WITH NEEDLE LOCALIZATION
Anesthesia: General

## 2022-07-03 MED ORDER — FAMOTIDINE 20 MG PO TABS
ORAL_TABLET | ORAL | Status: AC
  Administered 2022-07-03: 20 mg via ORAL
  Filled 2022-07-03: qty 1

## 2022-07-03 MED ORDER — LIDOCAINE HCL (CARDIAC) PF 100 MG/5ML IV SOSY
PREFILLED_SYRINGE | INTRAVENOUS | Status: DC | PRN
  Administered 2022-07-03: 100 mg via INTRAVENOUS

## 2022-07-03 MED ORDER — DEXAMETHASONE SODIUM PHOSPHATE 10 MG/ML IJ SOLN
INTRAMUSCULAR | Status: DC | PRN
  Administered 2022-07-03: 10 mg via INTRAVENOUS

## 2022-07-03 MED ORDER — ONDANSETRON HCL 4 MG/2ML IJ SOLN
4.0000 mg | Freq: Once | INTRAMUSCULAR | Status: AC | PRN
  Administered 2022-07-03: 4 mg via INTRAVENOUS

## 2022-07-03 MED ORDER — CHLORHEXIDINE GLUCONATE 0.12 % MT SOLN: 15.0000 mL | Freq: Once | OROMUCOSAL | Status: AC

## 2022-07-03 MED ORDER — PROPOFOL 10 MG/ML IV BOLUS
INTRAVENOUS | Status: AC
  Filled 2022-07-03: qty 20

## 2022-07-03 MED ORDER — FENTANYL CITRATE (PF) 100 MCG/2ML IJ SOLN
INTRAMUSCULAR | Status: AC
  Filled 2022-07-03: qty 2

## 2022-07-03 MED ORDER — FAMOTIDINE 20 MG PO TABS: 20.0000 mg | ORAL_TABLET | Freq: Once | ORAL | Status: AC

## 2022-07-03 MED ORDER — ORAL CARE MOUTH RINSE: 15.0000 mL | Freq: Once | OROMUCOSAL | Status: AC

## 2022-07-03 MED ORDER — CHLORHEXIDINE GLUCONATE CLOTH 2 % EX PADS
6.0000 | MEDICATED_PAD | Freq: Once | CUTANEOUS | Status: AC
  Administered 2022-07-03: 6 via TOPICAL

## 2022-07-03 MED ORDER — BUPIVACAINE-EPINEPHRINE (PF) 0.5% -1:200000 IJ SOLN
INTRAMUSCULAR | Status: AC
  Filled 2022-07-03: qty 30

## 2022-07-03 MED ORDER — ONDANSETRON HCL 4 MG/2ML IJ SOLN
INTRAMUSCULAR | Status: AC
  Filled 2022-07-03: qty 2

## 2022-07-03 MED ORDER — BUPIVACAINE-EPINEPHRINE (PF) 0.5% -1:200000 IJ SOLN
INTRAMUSCULAR | Status: DC | PRN
  Administered 2022-07-03: 30 mL

## 2022-07-03 MED ORDER — LACTATED RINGERS IV SOLN: INTRAVENOUS | Status: DC

## 2022-07-03 MED ORDER — PROPOFOL 10 MG/ML IV BOLUS
INTRAVENOUS | Status: DC | PRN
  Administered 2022-07-03: 30 mg via INTRAVENOUS
  Administered 2022-07-03: 150 mg via INTRAVENOUS

## 2022-07-03 MED ORDER — CHLORHEXIDINE GLUCONATE 0.12 % MT SOLN
OROMUCOSAL | Status: AC
  Administered 2022-07-03: 15 mL via OROMUCOSAL
  Filled 2022-07-03: qty 15

## 2022-07-03 MED ORDER — FENTANYL CITRATE (PF) 100 MCG/2ML IJ SOLN: 25.0000 ug | INTRAMUSCULAR | Status: DC | PRN

## 2022-07-03 MED ORDER — ACETAMINOPHEN 10 MG/ML IV SOLN
INTRAVENOUS | Status: AC
  Filled 2022-07-03: qty 100

## 2022-07-03 MED ORDER — LIDOCAINE HCL (PF) 2 % IJ SOLN
INTRAMUSCULAR | Status: AC
  Filled 2022-07-03: qty 5

## 2022-07-03 MED ORDER — FENTANYL CITRATE (PF) 100 MCG/2ML IJ SOLN
INTRAMUSCULAR | Status: DC | PRN
  Administered 2022-07-03: 25 ug via INTRAVENOUS

## 2022-07-03 MED ORDER — DEXAMETHASONE SODIUM PHOSPHATE 10 MG/ML IJ SOLN
INTRAMUSCULAR | Status: AC
  Filled 2022-07-03: qty 1

## 2022-07-03 MED ORDER — TRAMADOL HCL 50 MG PO TABS: 50.0000 mg | ORAL_TABLET | ORAL | 0 refills | Status: DC | PRN

## 2022-07-03 MED ORDER — ACETAMINOPHEN 10 MG/ML IV SOLN
INTRAVENOUS | Status: DC | PRN
  Administered 2022-07-03: 1000 mg via INTRAVENOUS

## 2022-07-03 SURGICAL SUPPLY — 59 items
APL PRP STRL LF DISP 70% ISPRP (MISCELLANEOUS) ×1
BLADE BOVIE TIP EXT 4 (BLADE) IMPLANT
BLADE SURG 15 STRL SS SAFETY (BLADE) ×2 IMPLANT
BULB RESERV EVAC DRAIN JP 100C (MISCELLANEOUS) IMPLANT
CHLORAPREP W/TINT 26 (MISCELLANEOUS) ×1 IMPLANT
CNTNR SPEC 2.5X3XGRAD LEK (MISCELLANEOUS)
CONT SPEC 4OZ STER OR WHT (MISCELLANEOUS)
CONT SPEC 4OZ STRL OR WHT (MISCELLANEOUS)
CONTAINER SPEC 2.5X3XGRAD LEK (MISCELLANEOUS) IMPLANT
COVER PROBE FLX POLY STRL (MISCELLANEOUS) ×1 IMPLANT
COVER PROBE GAMMA FINDER SLV (MISCELLANEOUS) IMPLANT
DEVICE DUBIN SPECIMEN MAMMOGRA (MISCELLANEOUS) ×1 IMPLANT
DRAIN CHANNEL JP 15F RND 16 (MISCELLANEOUS) IMPLANT
DRAPE LAPAROTOMY TRNSV 106X77 (MISCELLANEOUS) ×1 IMPLANT
DRSG GAUZE FLUFF 36X18 (GAUZE/BANDAGES/DRESSINGS) ×2 IMPLANT
DRSG TELFA 3X8 NADH STRL (GAUZE/BANDAGES/DRESSINGS) ×1 IMPLANT
ELECT CAUTERY BLADE TIP 2.5 (TIP) ×1
ELECT REM PT RETURN 9FT ADLT (ELECTROSURGICAL) ×1
ELECTRODE CAUTERY BLDE TIP 2.5 (TIP) ×1 IMPLANT
ELECTRODE REM PT RTRN 9FT ADLT (ELECTROSURGICAL) ×1 IMPLANT
GAUZE 4X4 16PLY ~~LOC~~+RFID DBL (SPONGE) ×1 IMPLANT
GLOVE BIO SURGEON STRL SZ7.5 (GLOVE) ×1 IMPLANT
GLOVE SURG UNDER LTX SZ8 (GLOVE) ×1 IMPLANT
GOWN STRL REUS W/ TWL LRG LVL3 (GOWN DISPOSABLE) ×2 IMPLANT
GOWN STRL REUS W/TWL LRG LVL3 (GOWN DISPOSABLE) ×2
KIT TURNOVER KIT A (KITS) ×1 IMPLANT
LABEL OR SOLS (LABEL) ×1 IMPLANT
MANIFOLD NEPTUNE II (INSTRUMENTS) ×1 IMPLANT
MARGIN MAP 10MM (MISCELLANEOUS) ×1 IMPLANT
NDL HYPO 25X1 1.5 SAFETY (NEEDLE) ×2 IMPLANT
NDL SPNL 20GX3.5 QUINCKE YW (NEEDLE) IMPLANT
NEEDLE HYPO 22GX1.5 SAFETY (NEEDLE) ×1 IMPLANT
NEEDLE HYPO 25X1 1.5 SAFETY (NEEDLE) ×1 IMPLANT
NEEDLE SPNL 20GX3.5 QUINCKE YW (NEEDLE) IMPLANT
PACK BASIN MINOR ARMC (MISCELLANEOUS) ×1 IMPLANT
RETRACTOR RING XSMALL (MISCELLANEOUS) IMPLANT
RTRCTR WOUND ALEXIS 13CM XS SH (MISCELLANEOUS)
SHEARS FOC LG CVD HARMONIC 17C (MISCELLANEOUS) IMPLANT
SHEARS HARMONIC 9CM CVD (BLADE) IMPLANT
STRIP CLOSURE SKIN 1/2X4 (GAUZE/BANDAGES/DRESSINGS) ×1 IMPLANT
SUT ETHILON 3-0 FS-10 30 BLK (SUTURE)
SUT SILK 2 0 (SUTURE)
SUT SILK 2-0 18XBRD TIE 12 (SUTURE) ×1 IMPLANT
SUT VIC AB 2-0 CT1 27 (SUTURE) ×1
SUT VIC AB 2-0 CT1 TAPERPNT 27 (SUTURE) ×2 IMPLANT
SUT VIC AB 3-0 54X BRD REEL (SUTURE) ×1 IMPLANT
SUT VIC AB 3-0 BRD 54 (SUTURE) ×1
SUT VIC AB 3-0 SH 27 (SUTURE) ×1
SUT VIC AB 3-0 SH 27X BRD (SUTURE) ×2 IMPLANT
SUT VIC AB 4-0 FS2 27 (SUTURE) ×2 IMPLANT
SUTURE EHLN 3-0 FS-10 30 BLK (SUTURE) ×1 IMPLANT
SWABSTK COMLB BENZOIN TINCTURE (MISCELLANEOUS) ×1 IMPLANT
SYR 10ML LL (SYRINGE) ×1 IMPLANT
SYR BULB IRRIG 60ML STRL (SYRINGE) ×1 IMPLANT
TAPE TRANSPORE STRL 2 31045 (GAUZE/BANDAGES/DRESSINGS) ×1 IMPLANT
TRAP FLUID SMOKE EVACUATOR (MISCELLANEOUS) ×1 IMPLANT
TRAP NEPTUNE SPECIMEN COLLECT (MISCELLANEOUS) ×1 IMPLANT
WATER STERILE IRR 1000ML POUR (IV SOLUTION) ×1 IMPLANT
WATER STERILE IRR 500ML POUR (IV SOLUTION) ×1 IMPLANT

## 2022-07-03 NOTE — H&P (Signed)
Suzanne Escobar [id number] 12-11-1945     HPI: 76 y/o woman with low grade DCIS. For wide excision.   Medications Prior to Admission  Medication Sig Dispense Refill Last Dose   amitriptyline (ELAVIL) 25 MG tablet Take by mouth.   07/02/2022   calcium carbonate (TUMS - DOSED IN MG ELEMENTAL CALCIUM) 500 MG chewable tablet Chew 500 mg by mouth daily as needed for indigestion or heartburn.   07/02/2022   cyanocobalamin (VITAMIN B12) 1000 MCG tablet Take 1,000 mcg by mouth daily.   Past Week   magnesium oxide (MAG-OX) 400 MG tablet Take 400 mg by mouth daily.   Past Week   Melatonin 1 MG CAPS Take 1 mg by mouth at bedtime.   07/02/2022   metoprolol succinate (TOPROL-XL) 25 MG 24 hr tablet Take 25 mg by mouth every evening.   07/02/2022   rosuvastatin (CRESTOR) 5 MG tablet Take 5 mg by mouth daily.   07/02/2022   topiramate (TOPAMAX) 50 MG tablet Take 50 mg by mouth 2 (two) times daily.   07/02/2022   Vitamin D-Vitamin K (K2 PLUS D3 PO) Take 1 tablet by mouth daily.   Past Week   acetaminophen (TYLENOL) 500 MG tablet Take 1,000 mg by mouth every 6 (six) hours as needed.      aspirin EC 81 MG tablet Take by mouth.   06/27/2022   Dietary Management Product (VB6 P5P PO) Take 1 capsule by mouth daily.      methocarbamol (ROBAXIN) 500 MG tablet Take 1 tablet (500 mg total) by mouth 3 (three) times daily as needed for muscle spasms. (Patient not taking: Reported on 06/17/2022) 90 tablet 0    RESTASIS 0.05 % ophthalmic emulsion Place 1 drop into both eyes 2 (two) times daily. (Patient not taking: Reported on 06/17/2022)      Allergies  Allergen Reactions   Hydrocodone-Acetaminophen Itching    Can take if she takes benadryl with it.     Past Medical History:  Diagnosis Date   Anemia    Ankle fracture, left    Arthritis    Complication of anesthesia    a.) delayed emergence with routine colonoscopy; "I was given too much medicine". b.) PONV. c.) postoperative delirium; hallucinations.   Coronary  artery calcification seen on CT scan    a.) Coronary CTA 11/03/2011: calcium score 0. b.) LHC 01/03/2013: normal coronary anatomy. c.) CTA 04/04/2021: mild coronary artery calcifications.   Dysrhythmia    Endometriosis    Fibromyalgia    GERD (gastroesophageal reflux disease)    Hearing impaired    Hiatal hernia    History of hiatal hernia    HLD (hyperlipidemia)    HTN (hypertension)    IBS (irritable bowel syndrome)    Mitral valve prolapse    Palpitations    PONV (postoperative nausea and vomiting)    Pulmonary HTN ([place]) 08/21/2020   a.) TTE 08/21/2020: mild elevated PASP of 38 mmHg.   Skin cancer    SOB (shortness of breath)    Past Surgical History:  Procedure Laterality Date   ABDOMINAL HYSTERECTOMY     ANTERIOR CERVICAL DECOMP/DISCECTOMY FUSION N/A 12/03/2021   Procedure: C6-7 ANTERIOR CERVICAL DISCECTOMY AND FUSION (GLOBUS HEDRON);  Surgeon: Meade Maw, MD;  Location: [facility] ORS;  Service: Neurosurgery;  Laterality: N/A;   APPENDECTOMY     BACK SURGERY     cervical fusion and at tailbone   BLEPHAROPLASTY Bilateral    BREAST BIOPSY Left 06/08/2022   stereo calcs coil clip  path pending   CARDIAC CATHETERIZATION     X 3   CATARACT EXTRACTION, BILATERAL     CHOLECYSTECTOMY     COLONOSCOPY W/ POLYPECTOMY     COLONOSCOPY WITH ESOPHAGOGASTRODUODENOSCOPY (EGD)     FOOT SURGERY Bilateral    KYPHOPLASTY     LAPAROSCOPY     OOPHERECTOMY   LEFT HEART CATH AND CORONARY ANGIOGRAPHY Left 01/03/2013   Procedure: LEFT HEART CATH AND CORONARY ANGIOGRAPHY; Location: [facility]; Surgeon: Kathe Mariner, MD   ORIF WRIST FRACTURE Left 11/18/2020   Procedure: OPEN REDUCTION INTERNAL FIXATION OF LEFT DISTAL RADIUS FRACTURE;  Surgeon: Dereck Leep, MD;  Location: [facility] OR[facility];  Service: Orthopedics;  Laterality: Left;   SEPTOPLASTY     SHOULDER ARTHROSCOPY DISTAL CLAVICLE EXCISION AND OPEN ROTATOR CUFF REPAIR Bilateral    X2   TOTAL HIP ARTHROPLASTY Bilateral    Social History    Socioeconomic History   Marital status: Widowed    Spouse name: Not on file   Number of children: Not on file   Years of education: Not on file   Highest education level: Not on file  Occupational History   Not on file  Tobacco Use   Smoking status: Never   Smokeless tobacco: Never  Vaping Use   Vaping Use: Never used  Substance and Sexual Activity   Alcohol use: Yes    Comment: occasional wine   Drug use: Never   Sexual activity: Not on file  Other Topics Concern   Not on file  Social History Narrative   Not on file   Social Determinants of Health   Financial Resource Strain: Not on file  Food Insecurity: Not on file  Transportation Needs: Not on file  Physical Activity: Not on file  Stress: Not on file  Social Connections: Not on file  Intimate Partner Violence: Not on file   Social History   Social History Narrative   Not on file     ROS: Negative.     PE: HEENT: Negative. Lungs: Clear. Cardio: RR.    Assessment/Plan:  Proceed with planned wide excision left breast DCIS.   Forest Gleason Amaro Mangold 07/03/2022

## 2022-07-03 NOTE — Transfer of Care (Signed)
Immediate Anesthesia Transfer of Care Note  Patient: Suzanne Escobar  Procedure(s) Performed: Procedure(s): BREAST LUMPECTOMY WITH NEEDLE LOCALIZATION (N/A)  Patient Location: PACU  Anesthesia Type:General  Level of Consciousness: sedated  Airway & Oxygen Therapy: Patient Spontanous Breathing and Patient connected to face mask oxygen  Post-op Assessment: Report given to RN and Post -op Vital signs reviewed and stable  Post vital signs: Reviewed and stable  Last Vitals:  Vitals:   07/03/22 0938 07/03/22 1106  BP: 135/67 136/65  Pulse: 79 77  Resp: 16 13  Temp: (!) 36.3 C 36.8 C  SpO2: 72% 820%    Complications: No apparent anesthesia complications

## 2022-07-03 NOTE — Anesthesia Preprocedure Evaluation (Signed)
Anesthesia Evaluation  Patient identified by MRN, date of birth, ID band Patient awake    Reviewed: Allergy & Precautions, NPO status , Patient's Chart, lab work & pertinent test results  History of Anesthesia Complications (+) PONV and history of anesthetic complications  Airway Mallampati: III  TM Distance: >3 FB Neck ROM: Limited    Dental  (+) Teeth Intact, Caps, Dental Advisory Given   Pulmonary neg pulmonary ROS,    Pulmonary exam normal breath sounds clear to auscultation       Cardiovascular hypertension, Pt. on medications + CAD  negative cardio ROS Normal cardiovascular exam+ dysrhythmias  Rhythm:Regular Rate:Normal     Neuro/Psych Anxiety negative neurological ROS  negative psych ROS   GI/Hepatic negative GI ROS, Neg liver ROS, hiatal hernia, GERD  Medicated,  Endo/Other  negative endocrine ROS  Renal/GU negative Renal ROS     Musculoskeletal   Abdominal Normal abdominal exam  (+)   Peds negative pediatric ROS (+)  Hematology negative hematology ROS (+) Blood dyscrasia, anemia ,   Anesthesia Other Findings Past Medical History: No date: Anemia No date: Ankle fracture, left No date: Arthritis No date: Complication of anesthesia     Comment:  a.) delayed emergence with routine colonoscopy; "I was               given too much medicine". b.) PONV. c.) postoperative               delirium; hallucinations. No date: Coronary artery calcification seen on CT scan     Comment:  a.) Coronary CTA 11/03/2011: calcium score 0. b.) LHC               01/03/2013: normal coronary anatomy. c.) CTA 04/04/2021:               mild coronary artery calcifications. No date: Dysrhythmia No date: Endometriosis No date: Fibromyalgia No date: GERD (gastroesophageal reflux disease) No date: Hearing impaired No date: Hiatal hernia No date: History of hiatal hernia No date: HLD (hyperlipidemia) No date: HTN  (hypertension) No date: IBS (irritable bowel syndrome) No date: Mitral valve prolapse No date: Palpitations No date: PONV (postoperative nausea and vomiting) 08/21/2020: Pulmonary HTN ([place])     Comment:  a.) TTE 08/21/2020: mild elevated PASP of 38 mmHg. No date: Skin cancer No date: SOB (shortness of breath)  Past Surgical History: No date: ABDOMINAL HYSTERECTOMY 12/03/2021: ANTERIOR CERVICAL DECOMP/DISCECTOMY FUSION; N/A     Comment:  Procedure: C6-7 ANTERIOR CERVICAL DISCECTOMY AND FUSION               (GLOBUS HEDRON);  Surgeon: Meade Maw, MD;                Location: [facility] ORS;  Service: Neurosurgery;  Laterality:               N/A; No date: APPENDECTOMY No date: BACK SURGERY     Comment:  cervical fusion and at tailbone No date: BLEPHAROPLASTY; Bilateral 06/08/2022: BREAST BIOPSY; Left     Comment:  stereo calcs coil clip path pending No date: CARDIAC CATHETERIZATION     Comment:  X 3 No date: CATARACT EXTRACTION, BILATERAL No date: CHOLECYSTECTOMY No date: COLONOSCOPY W/ POLYPECTOMY No date: COLONOSCOPY WITH ESOPHAGOGASTRODUODENOSCOPY (EGD) No date: FOOT SURGERY; Bilateral No date: KYPHOPLASTY No date: LAPAROSCOPY     Comment:  OOPHERECTOMY 01/03/2013: LEFT HEART CATH AND CORONARY ANGIOGRAPHY; Left     Comment:  Procedure: LEFT HEART CATH AND CORONARY ANGIOGRAPHY;  Location: [facility]; Surgeon: Kathe Mariner, MD 11/18/2020: ORIF WRIST FRACTURE; Left     Comment:  Procedure: OPEN REDUCTION INTERNAL FIXATION OF LEFT               DISTAL RADIUS FRACTURE;  Surgeon: Dereck Leep, MD;                Location: [facility] OR[facility];  Service: Orthopedics;  Laterality:               Left; No date: SEPTOPLASTY No date: SHOULDER ARTHROSCOPY DISTAL CLAVICLE EXCISION AND OPEN  ROTATOR CUFF REPAIR; Bilateral     Comment:  X2 No date: TOTAL HIP ARTHROPLASTY; Bilateral  BMI    Body Mass Index: 31.45 kg/m      Reproductive/Obstetrics negative OB ROS                              Anesthesia Physical Anesthesia Plan  ASA: 3  Anesthesia Plan: General   Post-op Pain Management:    Induction: Intravenous  PONV Risk Score and Plan: Dexamethasone, Ondansetron, Midazolam and Treatment may vary due to age or medical condition  Airway Management Planned: LMA  Additional Equipment:   Intra-op Plan:   Post-operative Plan: Extubation in OR  Informed Consent: I have reviewed the patients History and Physical, chart, labs and discussed the procedure including the risks, benefits and alternatives for the proposed anesthesia with the patient or authorized representative who has indicated his/her understanding and acceptance.     Dental advisory given  Plan Discussed with: CRNA and Surgeon  Anesthesia Plan Comments:         Anesthesia Quick Evaluation

## 2022-07-03 NOTE — Op Note (Signed)
Preoperative diagnosis: DCIS left breast.  Postoperative diagnosis: Same.  Operative procedure: Left breast biopsy with needle and wire localization.  Operating surgeon: Hervey Ard, MD.  Anesthesia: General by LMA, Marcaine 0.5% with 1: 200,000 units of epinephrine  Estimated blood loss: Less than 5 cc.  Clinical note: This 76 year old woman recently had a biopsy showing evidence of grade DCIS.  She desired breast conservation.  She underwent wire localization the morning of the procedure.  Antibiotics were not indicated.  SCD stockings for DVT prevention.  Operative note the patient underwent general anesthesia and tolerated this well.  The left breast and axilla was cleansed with ChloraPrep and draped.  Ultrasound was used to identify the tract of the wire which entered at the breast at the 4 o'clock position with the thickened portion at the 3 o'clock position medially.  Local anesthesia was infiltrated.  A radial incision was made at the 3 o'clock position and extended down to the subcutaneous fat.  An extra small Alexis wound protector was placed after bringing the wire into the surgical field.  A 3 x 3 x 5 cm block of tissue was excised and orientated.  Specimen radiograph showed the intact wire and the previously placed clip with microcalcifications.  The deep tissue was mobilized to allowed approximation with interrupted 2-0 Vicryl figure-of-eight sutures.  The superficial tissue was approximated in similar fashion.  Subcutaneous fat was approximated with interrupted 2-0 Vicryl sutures.  The skin was closed with a running 4-0 Vicryl subcuticular suture.  Benzoin and Steri-Strips followed by Telfa, fluff gauze and a compressive wrap were applied.  Patient tolerated procedure well and was taken to the PACU in stable condition.

## 2022-07-03 NOTE — Anesthesia Procedure Notes (Signed)
Procedure Name: LMA Insertion Date/Time: 07/03/2022 10:13 AM  Performed by: Doreen Salvage, CRNAPre-anesthesia Checklist: Patient identified, Patient being monitored, Timeout performed, Emergency Drugs available and Suction available Patient Re-evaluated:Patient Re-evaluated prior to induction Oxygen Delivery Method: Circle system utilized Preoxygenation: Pre-oxygenation with 100% oxygen Induction Type: IV induction Ventilation: Mask ventilation without difficulty LMA: LMA inserted LMA Size: 4.0 Tube type: Oral Number of attempts: 1 Placement Confirmation: positive ETCO2 and breath sounds checked- equal and bilateral Tube secured with: Tape Dental Injury: Teeth and Oropharynx as per pre-operative assessment

## 2022-07-03 NOTE — Anesthesia Postprocedure Evaluation (Signed)
Anesthesia Post Note  Patient: Suzanne Escobar  Procedure(s) Performed: BREAST LUMPECTOMY WITH NEEDLE LOCALIZATION  Patient location during evaluation: PACU Anesthesia Type: General Level of consciousness: awake and oriented Pain management: pain level controlled Respiratory status: respiratory function stable Cardiovascular status: stable Anesthetic complications: no   No notable events documented.   Last Vitals:  Vitals:   07/03/22 1107 07/03/22 1115  BP: 136/65 (!) 140/82  Pulse: 88 80  Resp: 12 18  Temp: 36.8 C   SpO2: 100% 99%    Last Pain:  Vitals:   07/03/22 1107  TempSrc:   PainSc: 0-No pain                 VAN STAVEREN,Hason Ofarrell

## 2022-07-03 NOTE — Discharge Instructions (Signed)
AMBULATORY SURGERY  ?DISCHARGE INSTRUCTIONS ? ? ?The drugs that you were given will stay in your system until tomorrow so for the next 24 hours you should not: ? ?Drive an automobile ?Make any legal decisions ?Drink any alcoholic beverage ? ? ?You may resume regular meals tomorrow.  Today it is better to start with liquids and gradually work up to solid foods. ? ?You may eat anything you prefer, but it is better to start with liquids, then soup and crackers, and gradually work up to solid foods. ? ? ?Please notify your doctor immediately if you have any unusual bleeding, trouble breathing, redness and pain at the surgery site, drainage, fever, or pain not relieved by medication. ? ? ? ?Additional Instructions: ? ? ? ?Please contact your physician with any problems or Same Day Surgery at [phone], Monday through Friday 6 am to 4 pm, or [facility] [place] Main number at [phone].  ?

## 2022-07-06 ENCOUNTER — Encounter: Payer: Self-pay | Admitting: General Surgery

## 2022-07-07 ENCOUNTER — Other Ambulatory Visit: Payer: Self-pay | Admitting: Pathology

## 2022-07-07 LAB — SURGICAL PATHOLOGY

## 2022-07-15 ENCOUNTER — Inpatient Hospital Stay (HOSPITAL_BASED_OUTPATIENT_CLINIC_OR_DEPARTMENT_OTHER): Payer: Medicare Other | Admitting: Oncology

## 2022-07-15 ENCOUNTER — Ambulatory Visit
Admission: RE | Admit: 2022-07-15 | Discharge: 2022-07-15 | Disposition: A | Discharge status: Home / Self Care | Payer: Medicare Other | Source: Ambulatory Visit | Attending: Radiation Oncology | Admitting: Radiation Oncology

## 2022-07-15 ENCOUNTER — Encounter: Payer: Self-pay | Admitting: Oncology

## 2022-07-15 VITALS — BP 114/75 | HR 88 | Wt 192.6 lb

## 2022-07-15 DIAGNOSIS — Z8049 Family history of malignant neoplasm of other genital organs: Secondary | ICD-10-CM | POA: Diagnosis not present

## 2022-07-15 DIAGNOSIS — I10 Essential (primary) hypertension: Secondary | ICD-10-CM | POA: Diagnosis not present

## 2022-07-15 DIAGNOSIS — Z85828 Personal history of other malignant neoplasm of skin: Secondary | ICD-10-CM | POA: Diagnosis not present

## 2022-07-15 DIAGNOSIS — Z803 Family history of malignant neoplasm of breast: Secondary | ICD-10-CM | POA: Insufficient documentation

## 2022-07-15 DIAGNOSIS — D0512 Intraductal carcinoma in situ of left breast: Secondary | ICD-10-CM | POA: Insufficient documentation

## 2022-07-15 DIAGNOSIS — Z90710 Acquired absence of both cervix and uterus: Secondary | ICD-10-CM | POA: Insufficient documentation

## 2022-07-15 DIAGNOSIS — Z807 Family history of other malignant neoplasms of lymphoid, hematopoietic and related tissues: Secondary | ICD-10-CM | POA: Insufficient documentation

## 2022-07-15 DIAGNOSIS — Z800 Family history of malignant neoplasm of digestive organs: Secondary | ICD-10-CM | POA: Diagnosis not present

## 2022-07-15 DIAGNOSIS — Z7189 Other specified counseling: Secondary | ICD-10-CM

## 2022-07-15 DIAGNOSIS — M85831 Other specified disorders of bone density and structure, right forearm: Secondary | ICD-10-CM | POA: Diagnosis not present

## 2022-07-15 DIAGNOSIS — C50412 Malignant neoplasm of upper-outer quadrant of left female breast: Secondary | ICD-10-CM

## 2022-07-15 MED ORDER — ANASTROZOLE 1 MG PO TABS: 1.0000 mg | ORAL_TABLET | Freq: Every day | ORAL | 2 refills | Status: DC

## 2022-07-15 NOTE — Consult Note (Signed)
NEW PATIENT EVALUATION  Name: Suzanne Escobar  MRN: [id number]  Date:   07/15/2022     DOB: 01/16/1946   This 76 y.o. 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  REFERRING PHYSICIAN: Gladstone Lighter, MD  CHIEF COMPLAINT:  Chief Complaint  Patient presents with   Breast Cancer    Consult    DIAGNOSIS: There were no encounter diagnoses.   PREVIOUS INVESTIGATIONS:  Mammogram and ultrasound reviewed Clinical notes reviewed Pathology report reviewed  HPI: Patient is a 76 year old female who presented with an abnormal mammogram of her left breast showing an indeterminate 10 mm group of amorphous calcifications in the upper outer quadrant of the left breast.  She underwent stereotactic guided biopsy showing ductal carcinoma in situ.  She went on to have a wide local excision for a 1.6 cm nuclear grade 2 ductal carcinoma in situ with margins clear but close at 0.5 mm inferiorly and anteriorly.  No lymph nodes were submitted.  ER status is not yet reported.  She still is somewhat sore in the lumpectomy site otherwise is doing well.  She is now referred to radiation oncology for evaluation.  PLANNED TREATMENT REGIMEN: Left hypofractionated whole breast radiation  PAST MEDICAL HISTORY:  has a past medical history of Anemia, Ankle fracture, left, Arthritis, Complication of anesthesia, Coronary artery calcification seen on CT scan, Dysrhythmia, Endometriosis, Fibromyalgia, GERD (gastroesophageal reflux disease), Hearing impaired, Hiatal hernia, History of hiatal hernia, HLD (hyperlipidemia), HTN (hypertension), IBS (irritable bowel syndrome), Mitral valve prolapse, Palpitations, PONV (postoperative nausea and vomiting), Pulmonary HTN ([place]) (08/21/2020), Skin cancer, and SOB (shortness of breath).    PAST SURGICAL HISTORY:  Past Surgical History:  Procedure Laterality Date    ABDOMINAL HYSTERECTOMY     ANTERIOR CERVICAL DECOMP/DISCECTOMY FUSION N/A 12/03/2021   Procedure: C6-7 ANTERIOR CERVICAL DISCECTOMY AND FUSION (GLOBUS HEDRON);  Surgeon: Meade Maw, MD;  Location: [facility] ORS;  Service: Neurosurgery;  Laterality: N/A;   APPENDECTOMY     BACK SURGERY     cervical fusion and at tailbone   BLEPHAROPLASTY Bilateral    BREAST BIOPSY Left 06/08/2022   stereo calcs coil clip path pending   BREAST LUMPECTOMY WITH NEEDLE LOCALIZATION N/A 07/03/2022   Procedure: BREAST LUMPECTOMY WITH NEEDLE LOCALIZATION;  Surgeon: Robert Bellow, MD;  Location: [facility];  Service: General;  Laterality: N/A;   CARDIAC CATHETERIZATION     X 3   CATARACT EXTRACTION, BILATERAL     CHOLECYSTECTOMY     COLONOSCOPY W/ POLYPECTOMY     COLONOSCOPY WITH ESOPHAGOGASTRODUODENOSCOPY (EGD)     FOOT SURGERY Bilateral    KYPHOPLASTY     LAPAROSCOPY     OOPHERECTOMY   LEFT HEART CATH AND CORONARY ANGIOGRAPHY Left 01/03/2013   Procedure: LEFT HEART CATH AND CORONARY ANGIOGRAPHY; Location: [facility]; Surgeon: Kathe Mariner, MD   ORIF WRIST FRACTURE Left 11/18/2020   Procedure: OPEN REDUCTION INTERNAL FIXATION OF LEFT DISTAL RADIUS FRACTURE;  Surgeon: Dereck Leep, MD;  Location: [facility];  Service: Orthopedics;  Laterality: Left;   SEPTOPLASTY     SHOULDER ARTHROSCOPY DISTAL CLAVICLE EXCISION AND OPEN ROTATOR CUFF REPAIR Bilateral    X2   TOTAL HIP ARTHROPLASTY Bilateral     FAMILY HISTORY: family history includes Breast cancer in her cousin, maternal aunt, sister, and sister; Cervical cancer in her niece and paternal aunt; Lymphoma in her brother; Throat cancer in her  paternal aunt.  SOCIAL HISTORY:  reports that she has never smoked. She has never used smokeless tobacco. She reports current alcohol use. She reports that she does not use drugs.  ALLERGIES: Hydrocodone-acetaminophen  MEDICATIONS:  Current Outpatient Medications  Medication Sig Dispense Refill   acetaminophen  (TYLENOL) 500 MG tablet Take 1,000 mg by mouth every 6 (six) hours as needed.     amitriptyline (ELAVIL) 25 MG tablet Take by mouth.     anastrozole (ARIMIDEX) 1 MG tablet Take 1 tablet (1 mg total) by mouth daily. 30 tablet 2   aspirin EC 81 MG tablet Take by mouth.     calcium carbonate (TUMS - DOSED IN MG ELEMENTAL CALCIUM) 500 MG chewable tablet Chew 500 mg by mouth daily as needed for indigestion or heartburn. (Patient not taking: Reported on 07/15/2022)     cyanocobalamin (VITAMIN B12) 1000 MCG tablet Take 1,000 mcg by mouth daily.     Dietary Management Product (VB6 P5P PO) Take 1 capsule by mouth daily. (Patient not taking: Reported on 07/15/2022)     magnesium oxide (MAG-OX) 400 MG tablet Take 400 mg by mouth daily.     Melatonin 1 MG CAPS Take 1 mg by mouth at bedtime.     melatonin 5 MG TABS Take 5 mg by mouth.     metoprolol succinate (TOPROL-XL) 25 MG 24 hr tablet Take 25 mg by mouth every evening.     RESTASIS 0.05 % ophthalmic emulsion Place 1 drop into both eyes 2 (two) times daily. (Patient not taking: Reported on 06/17/2022)     rOPINIRole (REQUIP) 0.25 MG tablet Take 0.25 mg by mouth 2 (two) times daily. (Patient not taking: Reported on 07/15/2022)     rosuvastatin (CRESTOR) 5 MG tablet Take 5 mg by mouth daily.     topiramate (TOPAMAX) 50 MG tablet Take 50 mg by mouth 2 (two) times daily.     Vitamin D-Vitamin K (K2 PLUS D3 PO) Take 1 tablet by mouth daily.     No current facility-administered medications for this encounter.    ECOG PERFORMANCE STATUS:  1 - Symptomatic but completely ambulatory  REVIEW OF SYSTEMS: Patient denies any weight loss, fatigue, weakness, fever, chills or night sweats. Patient denies any loss of vision, blurred vision. Patient denies any ringing  of the ears or hearing loss. No irregular heartbeat. Patient denies heart murmur or history of fainting. Patient denies any chest pain or pain radiating to her upper extremities. Patient denies any  shortness of breath, difficulty breathing at night, cough or hemoptysis. Patient denies any swelling in the lower legs. Patient denies any nausea vomiting, vomiting of blood, or coffee ground material in the vomitus. Patient denies any stomach pain. Patient states has had normal bowel movements no significant constipation or diarrhea. Patient denies any dysuria, hematuria or significant nocturia. Patient denies any problems walking, swelling in the joints or loss of balance. Patient denies any skin changes, loss of hair or loss of weight. Patient denies any excessive worrying or anxiety or significant depression. Patient denies any problems with insomnia. Patient denies excessive thirst, polyuria, polydipsia. Patient denies any swollen glands, patient denies easy bruising or easy bleeding. Patient denies any recent infections, allergies or URI. Patient "s visual fields have not changed significantly in recent time.   PHYSICAL EXAM: There were no vitals taken for this visit. Patient is wide local excision in the upper outer left breast which is healing well.  There is a seroma present.  No other  dominant masses noted in either breast.  No axillary or supraclavicular adenopathy is appreciated.  Well-developed well-nourished patient in NAD. HEENT reveals PERLA, EOMI, discs not visualized.  Oral cavity is clear. No oral mucosal lesions are identified. Neck is clear without evidence of cervical or supraclavicular adenopathy. Lungs are clear to A&P. Cardiac examination is essentially unremarkable with regular rate and rhythm without murmur rub or thrill. Abdomen is benign with no organomegaly or masses noted. Motor sensory and DTR levels are equal and symmetric in the upper and lower extremities. Cranial nerves II through XII are grossly intact. Proprioception is intact. No peripheral adenopathy or edema is identified. No motor or sensory levels are noted. Crude visual fields are within normal range.  LABORATORY  DATA: Pathology reports reviewed    RADIOLOGY RESULTS: Mammogram and ultrasound reviewed compatible with above-stated findings   IMPRESSION: Stage 0 ductal carcinoma in situ of the left breast status post wide local excision in 76 year old female  PLAN: This time is going with whole breast radiation hypofractionated course of treatment over 3 weeks.  Also boost her scar another 1600 centigrade based on the close less than 2 mm margin for her DCIS.  Risks and benefits of treatment were reviewed with the patient.  I would not advocate for further reexcision.  Side effects such as skin reaction fatigue alteration blood counts possible inclusion of superficial lung all were discussed in detail with the patient.  She comprehends my treatment plan well.  I would like to take this opportunity to thank you for allowing me to participate in the care of your patient.Noreene Filbert, MD

## 2022-07-15 NOTE — Progress Notes (Signed)
Pt states she is still sore/tende rand some swelling near breast surgical area. Pt will like to discuss early signs of dementia and if it deals with any radiation; states her sister went through the same and is curious if dementia is possible. As well as, dealing with more frequent indigestion.

## 2022-07-15 NOTE — Progress Notes (Signed)
Hematology/Oncology Consult note [facility]  Telephone:([phone] [phone]  Patient Care Team: Gladstone Lighter, MD as PCP - General (Internal Medicine) Daiva Huge, RN as Oncology Nurse Navigator   Name of the patient: Suzanne Escobar  [id number]  12/01/1945   Date of visit: 07/15/22  Diagnosis-left breast DCIS ER positive  Chief complaint/ Reason for visit-discuss pathology results and further management  Heme/Onc history: patient is a 76 year old female who underwent a routine screening marrow gram in July 2023 which showed suspicious calcifications in her left breast.  This was followed by diagnostic mammogram which showed 10 mm group of calcifications in the upper outer quadrant of the left breast.  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  Patient has met with Dr. Bary Castilla and will be undergoing lumpectomy at some point next month.  She is otherwise doing well for her age.  There is a family history of breast cancer in 2 of her sisters.  Menarche at the age of 18.  She is G3 P3.  Age at first birth 59.  She is not presently on hormone replacement therapy.  No prior history of breast biopsies.  Final pathology showed 16 mm grade 2 DCIS with close   Inferior-anterior margin of 0.5 mm. No evidence of invasive malignancy.  Interval history-  patient reports soreness at the site of her recent lumpectomy but denies other complaints at this time  ECOG PS- 0 Pain scale- 3   Review of systems- Review of Systems  Constitutional:  Negative for chills, fever, malaise/fatigue and weight loss.  HENT:  Negative for congestion, ear discharge and nosebleeds.   Eyes:  Negative for blurred vision.  Respiratory:  Negative for cough, hemoptysis, sputum production, shortness of breath and wheezing.   Cardiovascular:  Negative for chest pain, palpitations, orthopnea and claudication.  Gastrointestinal:  Negative for abdominal pain, blood in stool,  constipation, diarrhea, heartburn, melena, nausea and vomiting.  Genitourinary:  Negative for dysuria, flank pain, frequency, hematuria and urgency.  Musculoskeletal:  Negative for back pain, joint pain and myalgias.  Skin:  Negative for rash.  Neurological:  Negative for dizziness, tingling, focal weakness, seizures, weakness and headaches.  Endo/Heme/Allergies:  Does not bruise/bleed easily.  Psychiatric/Behavioral:  Negative for depression and suicidal ideas. The patient does not have insomnia.       Allergies  Allergen Reactions   Hydrocodone-Acetaminophen Itching    Can take if she takes benadryl with it.       Past Medical History:  Diagnosis Date   Anemia    Ankle fracture, left    Arthritis    Complication of anesthesia    a.) delayed emergence with routine colonoscopy; "I was given too much medicine". b.) PONV. c.) postoperative delirium; hallucinations.   Coronary artery calcification seen on CT scan    a.) Coronary CTA 11/03/2011: calcium score 0. b.) LHC 01/03/2013: normal coronary anatomy. c.) CTA 04/04/2021: mild coronary artery calcifications.   Dysrhythmia    Endometriosis    Fibromyalgia    GERD (gastroesophageal reflux disease)    Hearing impaired    Hiatal hernia    History of hiatal hernia    HLD (hyperlipidemia)    HTN (hypertension)    IBS (irritable bowel syndrome)    Mitral valve prolapse    Palpitations    PONV (postoperative nausea and vomiting)    Pulmonary HTN ([place]) 08/21/2020   a.) TTE 08/21/2020: mild elevated PASP of 38 mmHg.   Skin cancer  SOB (shortness of breath)      Past Surgical History:  Procedure Laterality Date   ABDOMINAL HYSTERECTOMY     ANTERIOR CERVICAL DECOMP/DISCECTOMY FUSION N/A 12/03/2021   Procedure: C6-7 ANTERIOR CERVICAL DISCECTOMY AND FUSION (GLOBUS HEDRON);  Surgeon: Meade Maw, MD;  Location: [facility];  Service: Neurosurgery;  Laterality: N/A;   APPENDECTOMY     BACK SURGERY     cervical fusion and  at tailbone   BLEPHAROPLASTY Bilateral    BREAST BIOPSY Left 06/08/2022   stereo calcs coil clip path pending   BREAST LUMPECTOMY WITH NEEDLE LOCALIZATION N/A 07/03/2022   Procedure: BREAST LUMPECTOMY WITH NEEDLE LOCALIZATION;  Surgeon: Robert Bellow, MD;  Location: [facility];  Service: General;  Laterality: N/A;   CARDIAC CATHETERIZATION     X 3   CATARACT EXTRACTION, BILATERAL     CHOLECYSTECTOMY     COLONOSCOPY W/ POLYPECTOMY     COLONOSCOPY WITH ESOPHAGOGASTRODUODENOSCOPY (EGD)     FOOT SURGERY Bilateral    KYPHOPLASTY     LAPAROSCOPY     OOPHERECTOMY   LEFT HEART CATH AND CORONARY ANGIOGRAPHY Left 01/03/2013   Procedure: LEFT HEART CATH AND CORONARY ANGIOGRAPHY; Location: [facility]; Surgeon: Kathe Mariner, MD   ORIF WRIST FRACTURE Left 11/18/2020   Procedure: OPEN REDUCTION INTERNAL FIXATION OF LEFT DISTAL RADIUS FRACTURE;  Surgeon: Dereck Leep, MD;  Location: [facility];  Service: Orthopedics;  Laterality: Left;   SEPTOPLASTY     SHOULDER ARTHROSCOPY DISTAL CLAVICLE EXCISION AND OPEN ROTATOR CUFF REPAIR Bilateral    X2   TOTAL HIP ARTHROPLASTY Bilateral     Social History   Socioeconomic History   Marital status: Widowed    Spouse name: Not on file   Number of children: Not on file   Years of education: Not on file   Highest education level: Not on file  Occupational History   Not on file  Tobacco Use   Smoking status: Never   Smokeless tobacco: Never  Vaping Use   Vaping Use: Never used  Substance and Sexual Activity   Alcohol use: Yes    Comment: occasional wine   Drug use: Never   Sexual activity: Not on file  Other Topics Concern   Not on file  Social History Narrative   Not on file   Social Determinants of Health   Financial Resource Strain: Not on file  Food Insecurity: Not on file  Transportation Needs: Not on file  Physical Activity: Not on file  Stress: Not on file  Social Connections: Not on file  Intimate Partner Violence: Not on file     Family History  Problem Relation Age of Onset   Breast cancer Sister        d. 33s; metastatic   Breast cancer Sister        dx 80   Lymphoma Brother    Breast cancer Maternal Aunt        dx twice   Throat cancer Paternal Aunt    Cervical cancer Paternal Aunt    Breast cancer Cousin        maternal cousin   Cervical cancer Niece      Current Outpatient Medications:    acetaminophen (TYLENOL) 500 MG tablet, Take 1,000 mg by mouth every 6 (six) hours as needed., Disp: , Rfl:    amitriptyline (ELAVIL) 25 MG tablet, Take by mouth., Disp: , Rfl:    anastrozole (ARIMIDEX) 1 MG tablet, Take 1 tablet (1 mg total) by mouth daily.,  Disp: 30 tablet, Rfl: 2   aspirin EC 81 MG tablet, Take by mouth., Disp: , Rfl:    cyanocobalamin (VITAMIN B12) 1000 MCG tablet, Take 1,000 mcg by mouth daily., Disp: , Rfl:    magnesium oxide (MAG-OX) 400 MG tablet, Take 400 mg by mouth daily., Disp: , Rfl:    Melatonin 1 MG CAPS, Take 1 mg by mouth at bedtime., Disp: , Rfl:    melatonin 5 MG TABS, Take 5 mg by mouth., Disp: , Rfl:    metoprolol succinate (TOPROL-XL) 25 MG 24 hr tablet, Take 25 mg by mouth every evening., Disp: , Rfl:    rosuvastatin (CRESTOR) 5 MG tablet, Take 5 mg by mouth daily., Disp: , Rfl:    topiramate (TOPAMAX) 50 MG tablet, Take 50 mg by mouth 2 (two) times daily., Disp: , Rfl:    Vitamin D-Vitamin K (K2 PLUS D3 PO), Take 1 tablet by mouth daily., Disp: , Rfl:    calcium carbonate (TUMS - DOSED IN MG ELEMENTAL CALCIUM) 500 MG chewable tablet, Chew 500 mg by mouth daily as needed for indigestion or heartburn. (Patient not taking: Reported on 07/15/2022), Disp: , Rfl:    Dietary Management Product (VB6 P5P PO), Take 1 capsule by mouth daily. (Patient not taking: Reported on 07/15/2022), Disp: , Rfl:    RESTASIS 0.05 % ophthalmic emulsion, Place 1 drop into both eyes 2 (two) times daily. (Patient not taking: Reported on 06/17/2022), Disp: , Rfl:    rOPINIRole (REQUIP) 0.25 MG tablet,  Take 0.25 mg by mouth 2 (two) times daily. (Patient not taking: Reported on 07/15/2022), Disp: , Rfl:   Physical exam:  Vitals:   07/15/22 1045  BP: 114/75  Pulse: 88  SpO2: 99%  Weight: 192 lb 9.6 oz (87.4 kg)   Physical Exam Constitutional:      General: She is not in acute distress. Cardiovascular:     Rate and Rhythm: Normal rate and regular rhythm.     Heart sounds: Normal heart sounds.  Pulmonary:     Effort: Pulmonary effort is normal.  Skin:    General: Skin is warm and dry.  Neurological:     Mental Status: She is alert and oriented to person, place, and time.         Latest Ref Rng & Units 04/04/2021   11:29 AM  CMP  Glucose 70 - 99 mg/dL 117   BUN 8 - 23 mg/dL 15   Creatinine 0.44 - 1.00 mg/dL 0.65   Sodium 135 - 145 mmol/L 138   Potassium 3.5 - 5.1 mmol/L 4.3   Chloride 98 - 111 mmol/L 107   CO2 22 - 32 mmol/L 25   Calcium 8.9 - 10.3 mg/dL 8.8       Latest Ref Rng & Units 04/04/2021   11:29 AM  CBC  WBC 4.0 - 10.5 K/uL 6.0   Hemoglobin 12.0 - 15.0 g/dL 11.1   Hematocrit 36.0 - 46.0 % 33.7   Platelets 150 - 400 K/uL 240     No images are attached to the encounter.  MM Breast Surgical Specimen  Result Date: 07/03/2022 CLINICAL DATA:  Left breast lumpectomy. EXAM: SPECIMEN RADIOGRAPH OF THE LEFT BREAST COMPARISON:  Previous exam(s). FINDINGS: Status post excision of the left breast. The wire tip and biopsy marker clip are present and are marked for pathology. IMPRESSION: Specimen radiograph of the left breast. Electronically Signed   By: Kristopher Oppenheim M.D.   On: 07/03/2022 10:44  MM LT [facility]  BREAST LOC DEV   1ST LESION  INC MAMMO GUIDE  Result Date: 07/03/2022 CLINICAL DATA:  76 year old female with recently diagnosed left breast DCIS. EXAM: NEEDLE LOCALIZATION OF THE LEFT BREAST WITH MAMMO GUIDANCE COMPARISON:  Previous exam(s). PROCEDURE: Patient presents for needle localization prior to surgical excision of the left breast. I met with the patient and we  discussed the procedure of needle localization including benefits and alternatives. We discussed the high likelihood of a successful procedure. We discussed the risks of the procedure, including infection, bleeding, tissue injury, and further surgery. Informed, written consent was given. The usual time-out protocol was performed immediately prior to the procedure. Using mammographic guidance, sterile technique, 1% lidocaine and a 7 cm modified Kopans needle, a coil shaped clip and associated pleomorphic calcifications was localized using lateral approach. The images were marked for Dr. Nicholes Mango. IMPRESSION: Needle localization left breast. No apparent complications. Electronically Signed   By: Kristopher Oppenheim M.D.   On: 07/03/2022 09:16  DG Bone Density  Result Date: 06/24/2022 EXAM: DUAL X-RAY ABSORPTIOMETRY (DXA) FOR BONE MINERAL DENSITY IMPRESSION: Your patient Jermiya Reichl completed a BMD test on 06/24/2022 using the [place] (analysis version: 14.10) manufactured by [employer]. The following summarizes the results of our evaluation. Technologist: PATIENT BIOGRAPHICAL: Name: Alix, Lahmann Patient ID: [id number] Birth Date: [birth year] Height: 65.0 in. Gender: Female Exam Date: 06/24/2022 Weight: 191.6 lbs. Indications: Caucasian, Early Menopause, Hip Replacement Left, Hip Replacement Right, History of Breast Cancer, History of Fracture (Adult), Hysterectomy, Oophorectomy Bilateral, POSTmenopausal Fractures: Fibula Left, Tibia Left Treatments: Calcium, Vitamin D ASSESSMENT: The BMD measured at Forearm Radius 33% is 0.725 g/cm2 with a T-score of -1.7. This patient is considered osteopenic according to [facility]) criteria. Patient is not a candidate for FRAX due to bilateral hip replacements. L-1 and L-2 were excluded due to degenerative changes and surgery. Bilateral Femurs were excluded due to bilateral hip replacements. Left forearm was excluded due to  surgery. The scan quality is good. Site Region Measured Measured WHO Young Adult BMD Date       Age      Classification T-score AP Spine L3-L4 8/24[age] Osteopenia -1.5 1.024 g/cm2 Right Forearm Radius 33% 06[age]24/2024 74.6 Osteopenia -1.7 0.725 g/cm2 World Health Organization [facility]) criteria for post-menopausal, Caucasian Women: Normal:       T-score at or above -1 SD Osteopenia:   T-score between -1 and -2.5 SD Osteoporosis: T-score at or below -2.5 SD RECOMMENDATIONS: 1. All patients should optimize calcium and vitamin D intake. 2. Consider FDA-approved medical therapies in postmenopausal women and men aged 59 years and older, based on the following: a. A hip or vertebral (clinical or morphometric) fracture b. T-score < -2.5 at the femoral neck or spine after appropriate evaluation to exclude secondary causes c. Low bone mass (T-score between -1.0 and -2.5 at the femoral neck or spine) and a 10-year probability of a hip fracture > 3% or a 10-year probability of a major osteoporosis-related fracture > 20% based on the [place]-adapted WHO algorithm d. Clinician judgment and/or patient preferences may indicate treatment for people with 10-year fracture probabilities above or below these levels FOLLOW-UP: People with diagnosed cases of osteoporosis or at high risk for fracture should have regular bone mineral density tests. For patients eligible for Medicare, routine testing is allowed once every 2 years. The testing frequency can be increased to one year for patients who have rapidly progressing disease, those who are receiving or discontinuing medical therapy  to restore bone mass, or have additional risk factors. I have reviewed this report, and agree with the above findings. [facility] Electronically Signed   By: Ammie Ferrier M.D.   On: 06/24/2022 09:39     Assessment and plan- Patient is a 76 y.o. female with left breast DCIS ER positive here to discuss further management  Discussed results of  final pathology with the patient which showed a 79m grade 2 DCIS with close inferior and anterior margin of 0.5 mm.  Ideally we needed to millimeter negative margin.  Options would be reexcision surgery versus radiation boost to the close margin.  Patient will be meeting radiation oncology to discuss adjuvant radiation further.  Given that she has ER positive DCIS there would be role for endocrine therapy for 5 years.  She is willing to try endocrine therapy and if she has significant side effects she will stop taking it.  Discussed risks and benefits of Arimidex including all but not limited to hot flashes, mood swings, arthralgias and worsening bone health.  Prescription for Arimidex sent to pharmacy and written information given to patient.  Patient will start taking this after radiation is completed.  Her baseline bone density scan showed osteopenia FRAX score could not be calculated due to bilateral hip replacements.  We will continue to keep an eye on this every other year.  I will see her sometime in mid February with CMP.  Treatment will be given with a curative intent   Visit Diagnosis 1. Ductal carcinoma in situ (DCIS) of left breast   2. Goals of care, counseling/discussion      Dr. ARanda Evens MD, MPH [id number]/18/2023 12:19 PM

## 2022-07-23 ENCOUNTER — Ambulatory Visit
Admission: RE | Admit: 2022-07-23 | Discharge: 2022-07-23 | Disposition: A | Discharge status: Home / Self Care | Payer: Medicare Other | Source: Ambulatory Visit | Attending: Radiation Oncology | Admitting: Radiation Oncology

## 2022-07-23 ENCOUNTER — Encounter: Payer: Self-pay | Admitting: *Deleted

## 2022-07-23 DIAGNOSIS — D0512 Intraductal carcinoma in situ of left breast: Secondary | ICD-10-CM | POA: Insufficient documentation

## 2022-07-23 NOTE — Progress Notes (Signed)
Met with Suzanne Escobar during her CT sim today.  She is doing well, no needs at this time.

## 2022-07-27 ENCOUNTER — Encounter: Payer: Self-pay | Admitting: Licensed Clinical Social Worker

## 2022-07-27 ENCOUNTER — Telehealth: Payer: Self-pay | Admitting: Licensed Clinical Social Worker

## 2022-07-27 DIAGNOSIS — D0512 Intraductal carcinoma in situ of left breast: Secondary | ICD-10-CM | POA: Diagnosis not present

## 2022-07-27 NOTE — Telephone Encounter (Signed)
I contacted Ms. Suzanne Escobar to discuss her genetic testing results. 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 We discussed this result briefly and Ms. Suzanne Escobar plans to see me 08/05/2022 at 1:15 to discuss further. No other pathogenic variants were identified in the 47 genes analyzed. Detailed clinic note to follow.   The test report has been scanned into EPIC and is located under the Molecular Pathology section of the Results Review tab.  A portion of the result report is included below for reference.      Faith Rogue, MS, [facility] Genetic Counselor [email or web address] Phone: [phone]

## 2022-07-29 ENCOUNTER — Ambulatory Visit
Admission: RE | Admit: 2022-07-29 | Discharge: 2022-07-29 | Disposition: A | Discharge status: Home / Self Care | Payer: Medicare Other | Source: Ambulatory Visit | Attending: Radiation Oncology | Admitting: Radiation Oncology

## 2022-07-29 DIAGNOSIS — D0512 Intraductal carcinoma in situ of left breast: Secondary | ICD-10-CM | POA: Diagnosis present

## 2022-07-30 ENCOUNTER — Other Ambulatory Visit: Payer: Self-pay

## 2022-07-30 ENCOUNTER — Ambulatory Visit: Payer: Medicare Other

## 2022-07-30 ENCOUNTER — Ambulatory Visit
Admission: RE | Admit: 2022-07-30 | Discharge: 2022-07-30 | Disposition: A | Discharge status: Home / Self Care | Payer: Medicare Other | Source: Ambulatory Visit | Attending: Radiation Oncology | Admitting: Radiation Oncology

## 2022-07-30 DIAGNOSIS — D0512 Intraductal carcinoma in situ of left breast: Secondary | ICD-10-CM | POA: Diagnosis not present

## 2022-07-30 LAB — RAD ONC ARIA SESSION SUMMARY
Course Elapsed Days: 0
Plan Fractions Treated to Date: 1
Plan Prescribed Dose Per Fraction: 2.66 Gy
Plan Total Fractions Prescribed: 16
Plan Total Prescribed Dose: 42.56 Gy
Reference Point Dosage Given to Date: 2.66 Gy
Reference Point Session Dosage Given: 2.66 Gy
Session Number: 1

## 2022-07-31 ENCOUNTER — Other Ambulatory Visit: Payer: Self-pay | Admitting: *Deleted

## 2022-07-31 ENCOUNTER — Ambulatory Visit
Admission: RE | Admit: 2022-07-31 | Discharge: 2022-07-31 | Disposition: A | Discharge status: Home / Self Care | Payer: Medicare Other | Source: Ambulatory Visit | Attending: Radiation Oncology | Admitting: Radiation Oncology

## 2022-07-31 ENCOUNTER — Other Ambulatory Visit: Payer: Self-pay

## 2022-07-31 DIAGNOSIS — C50412 Malignant neoplasm of upper-outer quadrant of left female breast: Secondary | ICD-10-CM

## 2022-07-31 DIAGNOSIS — D0512 Intraductal carcinoma in situ of left breast: Secondary | ICD-10-CM | POA: Diagnosis not present

## 2022-07-31 LAB — RAD ONC ARIA SESSION SUMMARY
Course Elapsed Days: 1
Plan Fractions Treated to Date: 2
Plan Prescribed Dose Per Fraction: 2.66 Gy
Plan Total Fractions Prescribed: 16
Plan Total Prescribed Dose: 42.56 Gy
Reference Point Dosage Given to Date: 5.32 Gy
Reference Point Session Dosage Given: 2.66 Gy
Session Number: 2

## 2022-08-03 ENCOUNTER — Other Ambulatory Visit: Payer: Self-pay

## 2022-08-03 ENCOUNTER — Ambulatory Visit
Admission: RE | Admit: 2022-08-03 | Discharge: 2022-08-03 | Disposition: A | Discharge status: Home / Self Care | Payer: Medicare Other | Source: Ambulatory Visit | Attending: Radiation Oncology | Admitting: Radiation Oncology

## 2022-08-03 DIAGNOSIS — D0512 Intraductal carcinoma in situ of left breast: Secondary | ICD-10-CM | POA: Diagnosis not present

## 2022-08-03 LAB — RAD ONC ARIA SESSION SUMMARY
Course Elapsed Days: 4
Plan Fractions Treated to Date: 3
Plan Prescribed Dose Per Fraction: 2.66 Gy
Plan Total Fractions Prescribed: 16
Plan Total Prescribed Dose: 42.56 Gy
Reference Point Dosage Given to Date: 7.98 Gy
Reference Point Session Dosage Given: 2.66 Gy
Session Number: 3

## 2022-08-04 ENCOUNTER — Ambulatory Visit
Admission: RE | Admit: 2022-08-04 | Discharge: 2022-08-04 | Disposition: A | Discharge status: Home / Self Care | Payer: Medicare Other | Source: Ambulatory Visit | Attending: Radiation Oncology | Admitting: Radiation Oncology

## 2022-08-04 ENCOUNTER — Other Ambulatory Visit: Payer: Self-pay

## 2022-08-04 DIAGNOSIS — D0512 Intraductal carcinoma in situ of left breast: Secondary | ICD-10-CM | POA: Diagnosis not present

## 2022-08-04 LAB — RAD ONC ARIA SESSION SUMMARY
Course Elapsed Days: 5
Plan Fractions Treated to Date: 4
Plan Prescribed Dose Per Fraction: 2.66 Gy
Plan Total Fractions Prescribed: 16
Plan Total Prescribed Dose: 42.56 Gy
Reference Point Dosage Given to Date: 10.64 Gy
Reference Point Session Dosage Given: 2.66 Gy
Session Number: 4

## 2022-08-05 ENCOUNTER — Inpatient Hospital Stay: Payer: Medicare Other | Admitting: Licensed Clinical Social Worker

## 2022-08-05 ENCOUNTER — Ambulatory Visit
Admission: RE | Admit: 2022-08-05 | Discharge: 2022-08-05 | Disposition: A | Discharge status: Home / Self Care | Payer: Medicare Other | Source: Ambulatory Visit | Attending: Radiation Oncology | Admitting: Radiation Oncology

## 2022-08-05 ENCOUNTER — Other Ambulatory Visit: Payer: Self-pay

## 2022-08-05 DIAGNOSIS — D0512 Intraductal carcinoma in situ of left breast: Secondary | ICD-10-CM | POA: Insufficient documentation

## 2022-08-05 DIAGNOSIS — Z1379 Encounter for other screening for genetic and chromosomal anomalies: Secondary | ICD-10-CM

## 2022-08-05 LAB — RAD ONC ARIA SESSION SUMMARY
Course Elapsed Days: 6
Plan Fractions Treated to Date: 5
Plan Prescribed Dose Per Fraction: 2.66 Gy
Plan Total Fractions Prescribed: 16
Plan Total Prescribed Dose: 42.56 Gy
Reference Point Dosage Given to Date: 13.3 Gy
Reference Point Session Dosage Given: 2.66 Gy
Session Number: 5

## 2022-08-06 ENCOUNTER — Other Ambulatory Visit: Payer: Self-pay

## 2022-08-06 ENCOUNTER — Inpatient Hospital Stay: Payer: Medicare Other

## 2022-08-06 ENCOUNTER — Ambulatory Visit
Admission: RE | Admit: 2022-08-06 | Discharge: 2022-08-06 | Disposition: A | Discharge status: Home / Self Care | Payer: Medicare Other | Source: Ambulatory Visit | Attending: Radiation Oncology | Admitting: Radiation Oncology

## 2022-08-06 DIAGNOSIS — Z1379 Encounter for other screening for genetic and chromosomal anomalies: Secondary | ICD-10-CM | POA: Insufficient documentation

## 2022-08-06 DIAGNOSIS — D0512 Intraductal carcinoma in situ of left breast: Secondary | ICD-10-CM | POA: Diagnosis not present

## 2022-08-06 LAB — RAD ONC ARIA SESSION SUMMARY
Course Elapsed Days: 7
Plan Fractions Treated to Date: 6
Plan Prescribed Dose Per Fraction: 2.66 Gy
Plan Total Fractions Prescribed: 16
Plan Total Prescribed Dose: 42.56 Gy
Reference Point Dosage Given to Date: 15.96 Gy
Reference Point Session Dosage Given: 2.66 Gy
Session Number: 6

## 2022-08-06 NOTE — Progress Notes (Addendum)
HPI:   Suzanne Escobar was previously seen in the [place] clinic due to a personal and family history of breast cancer and concerns regarding a hereditary predisposition to cancer. Please refer to our prior cancer genetics clinic note for more information regarding our discussion, assessment and recommendations, at the time. Ms. Sukup recent genetic test results were disclosed to her, as were recommendations warranted by these results. These results and recommendations are discussed in more detail below.  CANCER HISTORY:  Oncology History  Ductal carcinoma in situ (DCIS) of left breast  06/17/2022 Initial Diagnosis   Ductal carcinoma in situ (DCIS) of left breast   06/17/2022 Cancer Staging   Staging form: Breast, AJCC 8th Edition - Clinical stage from 06/17/2022: Stage 0 (cTis (DCIS), cN0, cM0) - Signed by Sindy Guadeloupe, MD on 06/17/2022     FAMILY HISTORY:  We obtained a detailed, 4-generation family history.  Significant diagnoses are listed below: Family History  Problem Relation Age of Onset   Breast cancer Sister        d. 55s; metastatic   Breast cancer Sister        dx 41   Lymphoma Brother    Breast cancer Maternal Aunt        dx twice   Throat cancer Paternal Aunt    Cervical cancer Paternal Aunt    Breast cancer Cousin        maternal cousin   Cervical cancer Niece     Ms. Preslar has 2 daughters, 59 and 54, and 1 son. 53. She had 12 siblings. One sister died of metastatic breast cancer in her 59s. Her daughter, patient's niece, has cervical cancer. Another sister had breast cancer at 66 in three different places, is living at 57.    Ms. Asbill mother died at 64. Patient had 1 full maternal uncle and several half maternal aunts/uncles (through patient's grandfather). One aunt had breast cancer twice and her daughter also had breast cancer. Another maternal cousin has had cancer, unknown type.    Ms. Gregg father passed at 59 due  to heart attack. Patient had 12 paternal aunts/uncles. One aunt had cervical cancer and another aunt had throat cancer.   Ms. Kalama is unaware of previous family history of genetic testing for hereditary cancer risks. There is no reported Ashkenazi Jewish ancestry. There is no known consanguinity.     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 The remainder of testing was negative/normal.   The Common Hereditary Cancers Panel + RNA offered by [facility] includes sequencing and/or deletion duplication testing of the following 47 genes: APC, ATM, AXIN2, BARD1, BMPR1A, BRCA1, BRCA2, BRIP1, CDH1, CDKN2A (p14ARF), CDKN2A (p16INK4a), CKD4, CHEK2, CTNNA1, DICER1, EPCAM (Deletion/duplication testing only), GREM1 (promoter region deletion/duplication testing only), KIT, MEN1, MLH1, MSH2, MSH3, MSH6, MUTYH, NBN, NF1, NHTL1, PALB2, PDGFRA, PMS2, POLD1, POLE, PTEN, RAD50, RAD51C, RAD51D, SDHB, SDHC, SDHD, SMAD4, SMARCA4. STK11, TP53, TSC1, TSC2, and VHL.  The following genes were evaluated for sequence changes only: SDHA and HOXB13 c.251G>A variant only..   The test report has been scanned into EPIC and is located under the Molecular Pathology section of the Results Review tab.  A portion of the result report is included below for reference. Genetic testing reported out on 07/22/2022.      Genetic testing identified a variant of uncertain significance (  VUS) in the [facility] gene called c.4240C>T.  At this time, it is unknown if this variant is associated with an increased risk for cancer or if it is benign, but most uncertain variants are reclassified to benign. It should not be used to make medical management decisions. With time, we suspect the laboratory will determine the significance of this variant, if any.  If the laboratory reclassifies this variant, we will attempt to contact Ms. Broxson to discuss it further.   Ms. Mosso gene mutation does not explain her personal and family history of cancer. There may be no hereditary risk for breast cancer in the family. The breast cancer in her family may be due to other genetic or environmental factors. There may be a gene mutation in one of these genes that current testing methods cannot detect, but that chance is small. There could be another gene that has not yet been discovered, or that we have not yet tested, that is responsible for the cancer diagnoses in the family.   SDHA GENE:   Clinical condition Single pathogenic variants in the SDHA gene are associated with the development of paragangliomas (PGL) and pheochromocytomas [facility]) as seen in hereditary paraganglioma-pheochromocytoma syndrome, gastrointestinal stromal tumors (GIST), and mitochondrial complex II deficiency. It is unclear if pathogenic variants in SDHA are associated with other cancers as the data are limited and emerging. The clinical presentation is highly variable among those with a pathogenic variant in [place] and may be difficult to predict. An individual with a single SDHA pathogenic variant will not necessarily develop cancer in their lifetime, but the risk for cancer is increased over that of the general population.   Paragangliomas/Pheochromocytomas Paragangliomas (PGL) are rare, adult-onset, typically benign neuroendocrine tumors that arise from paraganglia. Paraganglia are a collection of neuroendocrine tissues that are distributed throughout the body, from the middle ear and skull base (called head-and-neck paragangliomas, or HNP) to the pelvis. PGLs located outside the head and neck most commonly occur in the adrenal glands and are called pheochromocytomas [facility]). They are also known as chromaffin tumors. PCCs are catecholamine-secreting PGLs that are confined to the adrenal  medulla, as defined by the The World Health Organization Tumor Classification; however, this term may also be used to refer to catecholamine-producing PGLs regardless of whether they are adrenal or extra-adrenal (PMID: 24825003). These lesions can cause excessive production of adrenal hormones, resulting in hypertension, headaches, tachycardia, anxiety, and sweaty or clammy skin in some individuals (PMID: 70488891, 69450388, 82800349). Most cases of PGL and [place] are sporadic, but approximately one-third are familial and due to an identifiable pathogenic variant in a susceptibility gene, such as SDHA, that can result in hereditary paraganglioma-pheochromocytoma syndrome (PMID: 17915056, 97948016, 55374827, 07867544, 92010071).   Pathogenic variants in SDHA are primarily associated with a risk of developing [place], but sympathetic non-adrenal PGL of abdomen and thorax as well as HNP have been reported (PMID: 21975883, 25498264). The age at diagnosis is highly variable (PMID: 15830940) and the risk of malignancy appears to be low(PMID: 76808811, 03159458). Additionally, the penetrance appears to be low, meaning most individuals with a pathogenic variant in this gene will not develop PGL (PMID: 59292446).  The estimated lifetime risk of paragangliomas and pheochromocytomas for those with an SHDA mutation is approximately 1-10%.   Gastrointestinal stromal tumors Gastrointestinal stromal tumors (GIST) are rare, typically adult-onset sarcomas that may be either benign or malignant. They can occur sporadically or due to a heritable pathogenic variant in a susceptibility gene, such as SDHA. GISTs can  develop anywhere along the GI tract, which includes the esophagus, stomach, gallbladder, liver, small intestine, colon, rectum, anus, and lining of the gut. More than half of GISTs start in the stomach. GISTs may develop in individuals with pathogenic variants in [facility] (PMID: 49702637, 85885027, 74128786).   Mitochondrial  Complex II Deficiency  Alternatively, SDHA variants can cause mitochondrial complex II deficiency, which is a rare condition that can affect various parts of the body in children and adults. Symptoms can vary widely, even within the same family, and may include brain and muscle diseases (encephalopathy, myopathy, hypotonia, leukodystrophy), poor growth, short stature, joint contractures and enlargement of the heart (cardiomyopathy). Mitochondrial complex II deficiency most often occurs when an individual carries two SDHA variants (autosomal recessive), but in some rare cases can be caused by just one SDHA variant (autosomal dominant).    Management will differ depending on the condition associated with the SDHA variant.    Management Recommendations:  There is conflicting information about how to best manage those with an incidental SDHA mutation. NCCN suggests the following:   It is suggested that individuals with hereditary paraganglioma-pheochromocytoma syndrome, along with their at-risk relatives, have regular clinical monitoring by a physician or medical team with expertise in the treatment of hereditary GIST and PGL/PCC syndromes. A consultation with an endocrine surgeon, endocrinologist, and otolaryngologist is also recommended to establish an individualized care plan.   Screening should begin between 73-5 years of age, or at least ten years before the earliest age at diagnosis in the family (PMID: 76720947). Lifelong annual biochemical and clinical surveillance has been suggested (PMID:  09628366; NCCN Guidelines Version 1.2023 Neuroendocrine and Adrenal Tumors; NCCN Guidelines Version 1.2023 Gastrointestinal Stromal Tumors (GISTs)).   Since SDH genes have variability in their tumor penetrance and risk for malignancy, consideration can be given to modified screening intervals, especially for less penetrant genes such as SDHA.   Blood pressure monitoring  Annual screening blood test for  plasma-free metanephrines or 24-hour urine test for fractionated metanephrines   Blood and/or urine screening should be done prior to any surgical procedures.  Cross-sectional imaging of the skull base to pelvis every 2-5 years This can be done by whole-body MRI or other non-radiation-containing imaging procedures.  If whole-body MRI is not available, may consider abdominal MRI, skull base and neck MRI, and chest CT.  Due to variability of SDHx genes in tumor penetrance and risk of malignancy, consider modified screening intervals, especially for less penetrant genes such as SDHA.  Education about the signs and symptoms of PGL and [place] tumors  Symptoms of these tumors may include a lump that gets bigger over time, hearing loss, ringing in the ears, difficulty swallowing, hoarseness of the voice, problems moving the tongue, unexplained sweating, headaches, heart palpitations, paleness, and/or feelings of anxiety or panic. Experiencing these symptoms should warrant a prompt medical evaluation.    It is important to note that individuals with a pathogenic SDHA variant may be at a greater risk of developing PGL or [place] when living in higher altitudes or when chronically exposed to hypoxic conditions. In general, inactivating mutations in one of the succinate dehydrogenase genes, which includes SDHA, can lead to accumulation of succinate, the formation of reactive oxygen species, and the activation of hypoxia-dependent pathways (PMID: 29476546). Avoidance of living at high altitudes and activities that promote long-term exposure to hypoxia and predispose to chronic lung disease (e.g., smoking) is therefore encouraged (PMID: 50354656).  Recent [place] guidelines:   Surveillance is not recommended in  persons with SDHA pathogenic variant and no personal or family history of PGL/PCC or other SDHA-related tumors (I.e. incidental finding) due to low penetrance (PMID: 37048889). Additionally, they do not recommend  offering testing for relatives. This result is considered a non-clinically actionable finding.   ([email or web address])  Given the conflicting guidelines, an individualized approach to surveillance may be warranted for Ms. Khim.   An individual's cancer risk and medical management are not determined by genetic test results alone. Overall cancer risk assessment incorporates additional factors, including personal medical history, family history, and any available genetic information that may result in a personalized plan for cancer prevention and surveillance.   Implications for Family Members: Pathogenic variants in [facility] resulting in GIST, PGL, and/or [place] have autosomal dominant inheritance. This means that an individual with a pathogenic variant has a 50% chance of passing the condition on to their offspring. Identification of a pathogenic variant allows for the recognition of at-risk relatives who can pursue testing for the familial variant.   Those with a pathogenic variant in SDHA are also carriers of mitochondrial complex II deficiency syndrome. Individuals in the family may also wish to have genetic testing to inform their risk of having a child with autosomal recessive mitochondrial complex II deficiency, as described above.    Family members are encouraged to consider genetic testing for this familial pathogenic variant.  Complimentary testing for the familial variant is available for 90 days from her report date (07/22/2022).  Family members who live outside of the area are encouraged to find a genetic counselor in their area by visiting: [email or web address].   Plan: Ms. 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   She does not have an endocrinologist to perform follow up for the diagnosis with an SDHA mutation. We have sent a message to Dr. Buddy Duty for his opinion on whether or not to refer for  surveillance.  Ms. Corbit did not have concerning features for autosomal dominant mitochondrial complex II deficiency at this time. If signs/symptoms develop, an evaluation by a medical geneticist or mitochondrial expert is indicated.    Resources: The [place] at www.pheopara.org is a TEFL teacher for both physicians and patients dealing with neuroendocrine diseases.    Our contact number was provided. Ms. Hinojosa questions were answered to her satisfaction, and she knows she is welcome to call us at anytime with additional questions or concerns.   Faith Rogue, MS, [facility] Genetic Counselor [email or web address] Phone: [phone]

## 2022-08-07 ENCOUNTER — Ambulatory Visit
Admission: RE | Admit: 2022-08-07 | Discharge: 2022-08-07 | Disposition: A | Discharge status: Home / Self Care | Payer: Medicare Other | Source: Ambulatory Visit | Attending: Radiation Oncology | Admitting: Radiation Oncology

## 2022-08-07 ENCOUNTER — Other Ambulatory Visit: Payer: Self-pay

## 2022-08-07 DIAGNOSIS — D0512 Intraductal carcinoma in situ of left breast: Secondary | ICD-10-CM | POA: Diagnosis not present

## 2022-08-07 LAB — RAD ONC ARIA SESSION SUMMARY
Course Elapsed Days: 8
Plan Fractions Treated to Date: 7
Plan Prescribed Dose Per Fraction: 2.66 Gy
Plan Total Fractions Prescribed: 16
Plan Total Prescribed Dose: 42.56 Gy
Reference Point Dosage Given to Date: 18.62 Gy
Reference Point Session Dosage Given: 2.66 Gy
Session Number: 7

## 2022-08-10 ENCOUNTER — Ambulatory Visit
Admission: RE | Admit: 2022-08-10 | Discharge: 2022-08-10 | Disposition: A | Discharge status: Home / Self Care | Payer: Medicare Other | Source: Ambulatory Visit | Attending: Radiation Oncology | Admitting: Radiation Oncology

## 2022-08-10 ENCOUNTER — Other Ambulatory Visit: Payer: Self-pay

## 2022-08-10 DIAGNOSIS — D0512 Intraductal carcinoma in situ of left breast: Secondary | ICD-10-CM | POA: Diagnosis not present

## 2022-08-10 LAB — RAD ONC ARIA SESSION SUMMARY
Course Elapsed Days: 11
Plan Fractions Treated to Date: 8
Plan Prescribed Dose Per Fraction: 2.66 Gy
Plan Total Fractions Prescribed: 16
Plan Total Prescribed Dose: 42.56 Gy
Reference Point Dosage Given to Date: 21.28 Gy
Reference Point Session Dosage Given: 2.66 Gy
Session Number: 8

## 2022-08-11 ENCOUNTER — Other Ambulatory Visit: Payer: Self-pay

## 2022-08-11 ENCOUNTER — Ambulatory Visit
Admission: RE | Admit: 2022-08-11 | Discharge: 2022-08-11 | Disposition: A | Discharge status: Home / Self Care | Payer: Medicare Other | Source: Ambulatory Visit | Attending: Radiation Oncology | Admitting: Radiation Oncology

## 2022-08-11 DIAGNOSIS — D0512 Intraductal carcinoma in situ of left breast: Secondary | ICD-10-CM | POA: Diagnosis not present

## 2022-08-11 LAB — RAD ONC ARIA SESSION SUMMARY
Course Elapsed Days: 12
Plan Fractions Treated to Date: 9
Plan Prescribed Dose Per Fraction: 2.66 Gy
Plan Total Fractions Prescribed: 16
Plan Total Prescribed Dose: 42.56 Gy
Reference Point Dosage Given to Date: 23.94 Gy
Reference Point Session Dosage Given: 2.66 Gy
Session Number: 9

## 2022-08-12 ENCOUNTER — Other Ambulatory Visit: Payer: Self-pay

## 2022-08-12 ENCOUNTER — Ambulatory Visit
Admission: RE | Admit: 2022-08-12 | Discharge: 2022-08-12 | Disposition: A | Discharge status: Home / Self Care | Payer: Medicare Other | Source: Ambulatory Visit | Attending: Radiation Oncology | Admitting: Radiation Oncology

## 2022-08-12 DIAGNOSIS — D0512 Intraductal carcinoma in situ of left breast: Secondary | ICD-10-CM | POA: Diagnosis not present

## 2022-08-12 LAB — RAD ONC ARIA SESSION SUMMARY
Course Elapsed Days: 13
Plan Fractions Treated to Date: 10
Plan Prescribed Dose Per Fraction: 2.66 Gy
Plan Total Fractions Prescribed: 16
Plan Total Prescribed Dose: 42.56 Gy
Reference Point Dosage Given to Date: 26.6 Gy
Reference Point Session Dosage Given: 2.66 Gy
Session Number: 10

## 2022-08-13 ENCOUNTER — Other Ambulatory Visit: Payer: Self-pay

## 2022-08-13 ENCOUNTER — Ambulatory Visit
Admission: RE | Admit: 2022-08-13 | Discharge: 2022-08-13 | Disposition: A | Discharge status: Home / Self Care | Payer: Medicare Other | Source: Ambulatory Visit | Attending: Radiation Oncology | Admitting: Radiation Oncology

## 2022-08-13 DIAGNOSIS — D0512 Intraductal carcinoma in situ of left breast: Secondary | ICD-10-CM | POA: Diagnosis not present

## 2022-08-13 LAB — RAD ONC ARIA SESSION SUMMARY
Course Elapsed Days: 14
Plan Fractions Treated to Date: 11
Plan Prescribed Dose Per Fraction: 2.66 Gy
Plan Total Fractions Prescribed: 16
Plan Total Prescribed Dose: 42.56 Gy
Reference Point Dosage Given to Date: 29.26 Gy
Reference Point Session Dosage Given: 2.66 Gy
Session Number: 11

## 2022-08-14 ENCOUNTER — Other Ambulatory Visit: Payer: Self-pay

## 2022-08-14 ENCOUNTER — Ambulatory Visit
Admission: RE | Admit: 2022-08-14 | Discharge: 2022-08-14 | Disposition: A | Discharge status: Home / Self Care | Payer: Medicare Other | Source: Ambulatory Visit | Attending: Radiation Oncology | Admitting: Radiation Oncology

## 2022-08-14 DIAGNOSIS — D0512 Intraductal carcinoma in situ of left breast: Secondary | ICD-10-CM | POA: Diagnosis not present

## 2022-08-14 LAB — RAD ONC ARIA SESSION SUMMARY
Course Elapsed Days: 15
Plan Fractions Treated to Date: 12
Plan Prescribed Dose Per Fraction: 2.66 Gy
Plan Total Fractions Prescribed: 16
Plan Total Prescribed Dose: 42.56 Gy
Reference Point Dosage Given to Date: 31.92 Gy
Reference Point Session Dosage Given: 2.66 Gy
Session Number: 12

## 2022-08-17 ENCOUNTER — Ambulatory Visit
Admission: RE | Admit: 2022-08-17 | Discharge: 2022-08-17 | Disposition: A | Discharge status: Home / Self Care | Payer: Medicare Other | Source: Ambulatory Visit | Attending: Radiation Oncology | Admitting: Radiation Oncology

## 2022-08-17 ENCOUNTER — Ambulatory Visit: Payer: Medicare Other

## 2022-08-17 ENCOUNTER — Other Ambulatory Visit: Payer: Self-pay

## 2022-08-17 DIAGNOSIS — D0512 Intraductal carcinoma in situ of left breast: Secondary | ICD-10-CM | POA: Diagnosis not present

## 2022-08-17 LAB — RAD ONC ARIA SESSION SUMMARY
Course Elapsed Days: 18
Plan Fractions Treated to Date: 13
Plan Prescribed Dose Per Fraction: 2.66 Gy
Plan Total Fractions Prescribed: 16
Plan Total Prescribed Dose: 42.56 Gy
Reference Point Dosage Given to Date: 34.58 Gy
Reference Point Session Dosage Given: 2.66 Gy
Session Number: 13

## 2022-08-18 ENCOUNTER — Other Ambulatory Visit: Payer: Self-pay

## 2022-08-18 ENCOUNTER — Ambulatory Visit
Admission: RE | Admit: 2022-08-18 | Discharge: 2022-08-18 | Disposition: A | Discharge status: Home / Self Care | Payer: Medicare Other | Source: Ambulatory Visit | Attending: Radiation Oncology | Admitting: Radiation Oncology

## 2022-08-18 DIAGNOSIS — D0512 Intraductal carcinoma in situ of left breast: Secondary | ICD-10-CM | POA: Diagnosis not present

## 2022-08-18 LAB — RAD ONC ARIA SESSION SUMMARY
Course Elapsed Days: 19
Plan Fractions Treated to Date: 14
Plan Prescribed Dose Per Fraction: 2.66 Gy
Plan Total Fractions Prescribed: 16
Plan Total Prescribed Dose: 42.56 Gy
Reference Point Dosage Given to Date: 37.24 Gy
Reference Point Session Dosage Given: 2.66 Gy
Session Number: 14

## 2022-08-19 ENCOUNTER — Other Ambulatory Visit: Payer: Self-pay

## 2022-08-19 ENCOUNTER — Ambulatory Visit
Admission: RE | Admit: 2022-08-19 | Discharge: 2022-08-19 | Disposition: A | Discharge status: Home / Self Care | Payer: Medicare Other | Source: Ambulatory Visit | Attending: Radiation Oncology | Admitting: Radiation Oncology

## 2022-08-19 ENCOUNTER — Inpatient Hospital Stay: Payer: Medicare Other

## 2022-08-19 DIAGNOSIS — D0512 Intraductal carcinoma in situ of left breast: Secondary | ICD-10-CM | POA: Diagnosis not present

## 2022-08-19 DIAGNOSIS — C50412 Malignant neoplasm of upper-outer quadrant of left female breast: Secondary | ICD-10-CM

## 2022-08-19 LAB — CBC
HCT: 38.8 % (ref 36.0–46.0)
Hemoglobin: 12.7 g/dL (ref 12.0–15.0)
MCH: 29.5 pg (ref 26.0–34.0)
MCHC: 32.7 g/dL (ref 30.0–36.0)
MCV: 90.2 fL (ref 80.0–100.0)
Platelets: 207 10*3/uL (ref 150–400)
RBC: 4.3 MIL/uL (ref 3.87–5.11)
RDW: 13.5 % (ref 11.5–15.5)
WBC: 4.2 10*3/uL (ref 4.0–10.5)
nRBC: 0 % (ref 0.0–0.2)

## 2022-08-19 LAB — RAD ONC ARIA SESSION SUMMARY
Course Elapsed Days: 20
Plan Fractions Treated to Date: 15
Plan Prescribed Dose Per Fraction: 2.66 Gy
Plan Total Fractions Prescribed: 16
Plan Total Prescribed Dose: 42.56 Gy
Reference Point Dosage Given to Date: 39.9 Gy
Reference Point Session Dosage Given: 2.66 Gy
Session Number: 15

## 2022-08-24 ENCOUNTER — Ambulatory Visit: Payer: Medicare Other

## 2022-08-24 ENCOUNTER — Ambulatory Visit
Admission: RE | Admit: 2022-08-24 | Discharge: 2022-08-24 | Disposition: A | Discharge status: Home / Self Care | Payer: Medicare Other | Source: Ambulatory Visit | Attending: Radiation Oncology | Admitting: Radiation Oncology

## 2022-08-24 ENCOUNTER — Other Ambulatory Visit: Payer: Self-pay

## 2022-08-24 DIAGNOSIS — D0512 Intraductal carcinoma in situ of left breast: Secondary | ICD-10-CM | POA: Diagnosis not present

## 2022-08-24 LAB — RAD ONC ARIA SESSION SUMMARY
Course Elapsed Days: 25
Plan Fractions Treated to Date: 16
Plan Prescribed Dose Per Fraction: 2.66 Gy
Plan Total Fractions Prescribed: 16
Plan Total Prescribed Dose: 42.56 Gy
Reference Point Dosage Given to Date: 42.56 Gy
Reference Point Session Dosage Given: 2.66 Gy
Session Number: 16

## 2022-08-25 ENCOUNTER — Ambulatory Visit
Admission: RE | Admit: 2022-08-25 | Discharge: 2022-08-25 | Disposition: A | Discharge status: Home / Self Care | Payer: Medicare Other | Source: Ambulatory Visit | Attending: Radiation Oncology | Admitting: Radiation Oncology

## 2022-08-25 ENCOUNTER — Other Ambulatory Visit: Payer: Self-pay

## 2022-08-25 DIAGNOSIS — D0512 Intraductal carcinoma in situ of left breast: Secondary | ICD-10-CM | POA: Diagnosis not present

## 2022-08-25 LAB — RAD ONC ARIA SESSION SUMMARY
Course Elapsed Days: 26
Plan Fractions Treated to Date: 1
Plan Prescribed Dose Per Fraction: 2 Gy
Plan Total Fractions Prescribed: 8
Plan Total Prescribed Dose: 16 Gy
Reference Point Dosage Given to Date: 2 Gy
Reference Point Session Dosage Given: 2 Gy
Session Number: 17

## 2022-08-26 ENCOUNTER — Other Ambulatory Visit: Payer: Self-pay

## 2022-08-26 ENCOUNTER — Ambulatory Visit
Admission: RE | Admit: 2022-08-26 | Discharge: 2022-08-26 | Disposition: A | Discharge status: Home / Self Care | Payer: Medicare Other | Source: Ambulatory Visit | Attending: Radiation Oncology | Admitting: Radiation Oncology

## 2022-08-26 DIAGNOSIS — D0512 Intraductal carcinoma in situ of left breast: Secondary | ICD-10-CM | POA: Diagnosis not present

## 2022-08-26 LAB — RAD ONC ARIA SESSION SUMMARY
Course Elapsed Days: 27
Plan Fractions Treated to Date: 2
Plan Prescribed Dose Per Fraction: 2 Gy
Plan Total Fractions Prescribed: 8
Plan Total Prescribed Dose: 16 Gy
Reference Point Dosage Given to Date: 4 Gy
Reference Point Session Dosage Given: 2 Gy
Session Number: 18

## 2022-08-27 ENCOUNTER — Other Ambulatory Visit: Payer: Self-pay

## 2022-08-27 ENCOUNTER — Ambulatory Visit
Admission: RE | Admit: 2022-08-27 | Discharge: 2022-08-27 | Disposition: A | Discharge status: Home / Self Care | Payer: Medicare Other | Source: Ambulatory Visit | Attending: Radiation Oncology | Admitting: Radiation Oncology

## 2022-08-27 DIAGNOSIS — D0512 Intraductal carcinoma in situ of left breast: Secondary | ICD-10-CM | POA: Diagnosis not present

## 2022-08-27 LAB — RAD ONC ARIA SESSION SUMMARY
Course Elapsed Days: 28
Plan Fractions Treated to Date: 3
Plan Prescribed Dose Per Fraction: 2 Gy
Plan Total Fractions Prescribed: 8
Plan Total Prescribed Dose: 16 Gy
Reference Point Dosage Given to Date: 6 Gy
Reference Point Session Dosage Given: 2 Gy
Session Number: 19

## 2022-08-28 ENCOUNTER — Other Ambulatory Visit: Payer: Self-pay

## 2022-08-28 ENCOUNTER — Ambulatory Visit
Admission: RE | Admit: 2022-08-28 | Discharge: 2022-08-28 | Disposition: A | Discharge status: Home / Self Care | Payer: Medicare Other | Source: Ambulatory Visit | Attending: Radiation Oncology | Admitting: Radiation Oncology

## 2022-08-28 DIAGNOSIS — D0512 Intraductal carcinoma in situ of left breast: Secondary | ICD-10-CM | POA: Insufficient documentation

## 2022-08-28 LAB — RAD ONC ARIA SESSION SUMMARY
Course Elapsed Days: 29
Plan Fractions Treated to Date: 4
Plan Prescribed Dose Per Fraction: 2 Gy
Plan Total Fractions Prescribed: 8
Plan Total Prescribed Dose: 16 Gy
Reference Point Dosage Given to Date: 8 Gy
Reference Point Session Dosage Given: 2 Gy
Session Number: 20

## 2022-08-31 ENCOUNTER — Ambulatory Visit
Admission: RE | Admit: 2022-08-31 | Discharge: 2022-08-31 | Disposition: A | Discharge status: Home / Self Care | Payer: Medicare Other | Source: Ambulatory Visit | Attending: Radiation Oncology | Admitting: Radiation Oncology

## 2022-08-31 ENCOUNTER — Other Ambulatory Visit: Payer: Self-pay

## 2022-08-31 DIAGNOSIS — D0512 Intraductal carcinoma in situ of left breast: Secondary | ICD-10-CM | POA: Diagnosis not present

## 2022-08-31 LAB — RAD ONC ARIA SESSION SUMMARY
Course Elapsed Days: 32
Plan Fractions Treated to Date: 5
Plan Prescribed Dose Per Fraction: 2 Gy
Plan Total Fractions Prescribed: 8
Plan Total Prescribed Dose: 16 Gy
Reference Point Dosage Given to Date: 10 Gy
Reference Point Session Dosage Given: 2 Gy
Session Number: 21

## 2022-09-01 ENCOUNTER — Other Ambulatory Visit: Payer: Self-pay

## 2022-09-01 ENCOUNTER — Other Ambulatory Visit: Payer: Self-pay | Admitting: *Deleted

## 2022-09-01 ENCOUNTER — Ambulatory Visit
Admission: RE | Admit: 2022-09-01 | Discharge: 2022-09-01 | Disposition: A | Discharge status: Home / Self Care | Payer: Medicare Other | Source: Ambulatory Visit | Attending: Radiation Oncology | Admitting: Radiation Oncology

## 2022-09-01 DIAGNOSIS — R898 Other abnormal findings in specimens from other organs, systems and tissues: Secondary | ICD-10-CM

## 2022-09-01 DIAGNOSIS — D0512 Intraductal carcinoma in situ of left breast: Secondary | ICD-10-CM | POA: Diagnosis not present

## 2022-09-01 LAB — RAD ONC ARIA SESSION SUMMARY
Course Elapsed Days: 33
Plan Fractions Treated to Date: 6
Plan Prescribed Dose Per Fraction: 2 Gy
Plan Total Fractions Prescribed: 8
Plan Total Prescribed Dose: 16 Gy
Reference Point Dosage Given to Date: 12 Gy
Reference Point Session Dosage Given: 2 Gy
Session Number: 22

## 2022-09-02 ENCOUNTER — Ambulatory Visit
Admission: RE | Admit: 2022-09-02 | Discharge: 2022-09-02 | Disposition: A | Discharge status: Home / Self Care | Payer: Medicare Other | Source: Ambulatory Visit | Attending: Radiation Oncology | Admitting: Radiation Oncology

## 2022-09-02 ENCOUNTER — Other Ambulatory Visit: Payer: Self-pay

## 2022-09-02 DIAGNOSIS — D0512 Intraductal carcinoma in situ of left breast: Secondary | ICD-10-CM | POA: Diagnosis not present

## 2022-09-02 LAB — RAD ONC ARIA SESSION SUMMARY
Course Elapsed Days: 34
Plan Fractions Treated to Date: 7
Plan Prescribed Dose Per Fraction: 2 Gy
Plan Total Fractions Prescribed: 8
Plan Total Prescribed Dose: 16 Gy
Reference Point Dosage Given to Date: 14 Gy
Reference Point Session Dosage Given: 2 Gy
Session Number: 23

## 2022-09-03 ENCOUNTER — Ambulatory Visit
Admission: RE | Admit: 2022-09-03 | Discharge: 2022-09-03 | Disposition: A | Discharge status: Home / Self Care | Payer: Medicare Other | Source: Ambulatory Visit | Attending: Radiation Oncology | Admitting: Radiation Oncology

## 2022-09-03 ENCOUNTER — Inpatient Hospital Stay: Payer: Medicare Other | Attending: Oncology

## 2022-09-03 ENCOUNTER — Other Ambulatory Visit: Payer: Self-pay

## 2022-09-03 ENCOUNTER — Encounter: Payer: Self-pay | Admitting: *Deleted

## 2022-09-03 DIAGNOSIS — D0512 Intraductal carcinoma in situ of left breast: Secondary | ICD-10-CM | POA: Diagnosis present

## 2022-09-03 DIAGNOSIS — C50412 Malignant neoplasm of upper-outer quadrant of left female breast: Secondary | ICD-10-CM

## 2022-09-03 LAB — RAD ONC ARIA SESSION SUMMARY
Course Elapsed Days: 35
Plan Fractions Treated to Date: 8
Plan Prescribed Dose Per Fraction: 2 Gy
Plan Total Fractions Prescribed: 8
Plan Total Prescribed Dose: 16 Gy
Reference Point Dosage Given to Date: 16 Gy
Reference Point Session Dosage Given: 2 Gy
Session Number: 24

## 2022-09-03 LAB — CBC
HCT: 40.9 % (ref 36.0–46.0)
Hemoglobin: 13.5 g/dL (ref 12.0–15.0)
MCH: 29.9 pg (ref 26.0–34.0)
MCHC: 33 g/dL (ref 30.0–36.0)
MCV: 90.7 fL (ref 80.0–100.0)
Platelets: 231 10*3/uL (ref 150–400)
RBC: 4.51 MIL/uL (ref 3.87–5.11)
RDW: 13.8 % (ref 11.5–15.5)
WBC: 5 10*3/uL (ref 4.0–10.5)
nRBC: 0 % (ref 0.0–0.2)

## 2022-09-03 NOTE — Progress Notes (Signed)
Met with patient at her last radiation tx

## 2022-09-04 ENCOUNTER — Ambulatory Visit: Payer: Medicare Other

## 2022-09-07 ENCOUNTER — Ambulatory Visit: Payer: Medicare Other

## 2022-11-17 ENCOUNTER — Other Ambulatory Visit: Payer: Self-pay | Admitting: *Deleted

## 2022-11-17 ENCOUNTER — Encounter: Payer: Self-pay | Admitting: Radiation Oncology

## 2022-11-17 ENCOUNTER — Inpatient Hospital Stay: Payer: Medicare Other | Attending: Oncology

## 2022-11-17 ENCOUNTER — Inpatient Hospital Stay (HOSPITAL_BASED_OUTPATIENT_CLINIC_OR_DEPARTMENT_OTHER): Payer: Medicare Other | Admitting: Oncology

## 2022-11-17 ENCOUNTER — Ambulatory Visit
Admission: RE | Admit: 2022-11-17 | Discharge: 2022-11-17 | Disposition: A | Discharge status: Home / Self Care | Payer: Medicare Other | Source: Ambulatory Visit | Attending: Radiation Oncology | Admitting: Radiation Oncology

## 2022-11-17 ENCOUNTER — Encounter: Payer: Self-pay | Admitting: Oncology

## 2022-11-17 VITALS — BP 134/70 | HR 88 | Temp 98.7°F | Resp 20 | Wt 197.4 lb

## 2022-11-17 VITALS — BP 107/74 | HR 81 | Temp 96.6°F | Wt 194.9 lb

## 2022-11-17 DIAGNOSIS — Z90710 Acquired absence of both cervix and uterus: Secondary | ICD-10-CM | POA: Insufficient documentation

## 2022-11-17 DIAGNOSIS — C50412 Malignant neoplasm of upper-outer quadrant of left female breast: Secondary | ICD-10-CM

## 2022-11-17 DIAGNOSIS — Z923 Personal history of irradiation: Secondary | ICD-10-CM | POA: Insufficient documentation

## 2022-11-17 DIAGNOSIS — Z8049 Family history of malignant neoplasm of other genital organs: Secondary | ICD-10-CM | POA: Diagnosis not present

## 2022-11-17 DIAGNOSIS — Z800 Family history of malignant neoplasm of digestive organs: Secondary | ICD-10-CM | POA: Insufficient documentation

## 2022-11-17 DIAGNOSIS — D0512 Intraductal carcinoma in situ of left breast: Secondary | ICD-10-CM | POA: Insufficient documentation

## 2022-11-17 DIAGNOSIS — Z803 Family history of malignant neoplasm of breast: Secondary | ICD-10-CM | POA: Diagnosis not present

## 2022-11-17 DIAGNOSIS — M2550 Pain in unspecified joint: Secondary | ICD-10-CM | POA: Insufficient documentation

## 2022-11-17 DIAGNOSIS — R5383 Other fatigue: Secondary | ICD-10-CM | POA: Insufficient documentation

## 2022-11-17 DIAGNOSIS — M8580 Other specified disorders of bone density and structure, unspecified site: Secondary | ICD-10-CM | POA: Diagnosis not present

## 2022-11-17 DIAGNOSIS — Z807 Family history of other malignant neoplasms of lymphoid, hematopoietic and related tissues: Secondary | ICD-10-CM | POA: Diagnosis not present

## 2022-11-17 DIAGNOSIS — I10 Essential (primary) hypertension: Secondary | ICD-10-CM | POA: Diagnosis not present

## 2022-11-17 DIAGNOSIS — Z86000 Personal history of in-situ neoplasm of breast: Secondary | ICD-10-CM

## 2022-11-17 DIAGNOSIS — Z170 Estrogen receptor positive status [ER+]: Secondary | ICD-10-CM | POA: Insufficient documentation

## 2022-11-17 DIAGNOSIS — Z08 Encounter for follow-up examination after completed treatment for malignant neoplasm: Secondary | ICD-10-CM

## 2022-11-17 LAB — COMPREHENSIVE METABOLIC PANEL
ALT: 18 U/L (ref 0–44)
AST: 23 U/L (ref 15–41)
Albumin: 3.6 g/dL (ref 3.5–5.0)
Alkaline Phosphatase: 78 U/L (ref 38–126)
Anion gap: 8 (ref 5–15)
BUN: 13 mg/dL (ref 8–23)
CO2: 24 mmol/L (ref 22–32)
Calcium: 8.4 mg/dL — ABNORMAL LOW (ref 8.9–10.3)
Chloride: 106 mmol/L (ref 98–111)
Creatinine, Ser: 0.6 mg/dL (ref 0.44–1.00)
GFR, Estimated: >60 mL/min (ref >60)
Glucose, Bld: 100 mg/dL — ABNORMAL HIGH (ref 70–99)
Potassium: 3.9 mmol/L (ref 3.5–5.1)
Sodium: 138 mmol/L (ref 135–145)
Total Bilirubin: 0.5 mg/dL (ref 0.3–1.2)
Total Protein: 7.1 g/dL (ref 6.5–8.1)

## 2022-11-17 MED ORDER — ANASTROZOLE 1 MG PO TABS: 1.0000 mg | ORAL_TABLET | Freq: Every day | ORAL | 3 refills | Status: DC

## 2022-11-17 NOTE — Progress Notes (Signed)
Patient does not have any concerns at this time.

## 2022-11-17 NOTE — Progress Notes (Signed)
Radiation Oncology Follow up Note  Name: Suzanne Escobar   Date:   11/17/2022 MRN:  [id number] DOB: December 16, 1945    This 77 y.o. female presents to the clinic today for 1 month follow-up status post whole breast radiation to her left breast for ER positive ductal carcinoma in situ.  REFERRING PROVIDER: Gladstone Lighter, MD  HPI: Patient is a 77 year old female now out 1 month having completed whole breast radiation to her left breast for ER positive ductal carcinoma in situ.  Seen today in follow-up she is doing well still having some occasional pains in the left breast related to her surgery and scarring.  She otherwise specifically denies cough or bone pain.  She has not yet been started on endocrine therapy and is seeing medical oncologist today..  COMPLICATIONS OF TREATMENT: none  FOLLOW UP COMPLIANCE: keeps appointments   PHYSICAL EXAM:  BP 134/70 (BP Location: Right Arm, Patient Position: Sitting, Cuff Size: Large)   Pulse 88   Temp 98.7 F (37.1 C) (Tympanic)   Resp 20   Wt 197 lb 6.4 oz (89.5 kg)   BMI 32.85 kg/m  Lungs are clear to A&P cardiac examination essentially unremarkable with regular rate and rhythm. No dominant mass or nodularity is noted in either breast in 2 positions examined. Incision is well-healed. No axillary or supraclavicular adenopathy is appreciated. Cosmetic result is excellent.  Well-developed well-nourished patient in NAD. HEENT reveals PERLA, EOMI, discs not visualized.  Oral cavity is clear. No oral mucosal lesions are identified. Neck is clear without evidence of cervical or supraclavicular adenopathy. Lungs are clear to A&P. Cardiac examination is essentially unremarkable with regular rate and rhythm without murmur rub or thrill. Abdomen is benign with no organomegaly or masses noted. Motor sensory and DTR levels are equal and symmetric in the upper and lower extremities. Cranial nerves II through XII are grossly intact. Proprioception is intact. No  peripheral adenopathy or edema is identified. No motor or sensory levels are noted. Crude visual fields are within normal range.  RADIOLOGY RESULTS: No current films for review  PLAN: Present time patient is doing well.  I have explained to her the nature of her shooting type pains intermittently of her left breast secondary to surgery and scarring.  I have asked to see her back in 6 months for follow-up.  She will probably go on endocrine therapy today after discussion with medical oncology.  Patient is to call with any concerns.  I would like to take this opportunity to thank you for allowing me to participate in the care of your patient.Noreene Filbert, MD

## 2022-11-17 NOTE — Progress Notes (Signed)
Hematology/Oncology Consult note [facility]  [phone] [phone]  Patient Care Team: Gladstone Lighter, MD as PCP - General (Internal Medicine) Daiva Huge, RN as Oncology Nurse Navigator   Name of the patient: Suzanne Escobar  [id number]  1945-10-05   Date of visit: 11/17/22  Diagnosis- left breast DCIS ER positive   Chief complaint/ Reason for visit-routine follow-up of DCIS  Heme/Onc history: patient is a 77 year old female who underwent a routine screening marrow gram in July 2023 which showed suspicious calcifications in her left breast.  This was followed by diagnostic mammogram which showed 10 mm group of calcifications in the upper outer quadrant of the left breast.  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  Patient has met with Dr. Bary Castilla and will be undergoing lumpectomy at some point next month.  She is otherwise doing well for her age.  There is a family history of breast cancer in 2 of her sisters.  Menarche at the age of 36.  She is G3 P3.  Age at first birth 37.  She is not presently on hormone replacement therapy.  No prior history of breast biopsies.   Final pathology showed 16 mm grade 2 DCIS with close   Inferior-anterior margin of 0.5 mm. No evidence of invasive malignancy.  Patient completed adjuvant radiation therapy.  She has not started endocrine therapy yet baseline bone density scan showed osteopenia  Interval history-patient has not started taking aromatase inhibitors yet.  She was supposed to start after radiation but was concerned about potential side effects.  She has some baseline fatigue and arthralgias.  ECOG PS- 1 Pain scale- 3   Review of systems- Review of Systems  Constitutional:  Negative for chills, fever, malaise/fatigue and weight loss.  HENT:  Negative for congestion, ear discharge and nosebleeds.   Eyes:  Negative for blurred vision.  Respiratory:  Negative for cough, hemoptysis,  sputum production, shortness of breath and wheezing.   Cardiovascular:  Negative for chest pain, palpitations, orthopnea and claudication.  Gastrointestinal:  Negative for abdominal pain, blood in stool, constipation, diarrhea, heartburn, melena, nausea and vomiting.  Genitourinary:  Negative for dysuria, flank pain, frequency, hematuria and urgency.  Musculoskeletal:  Negative for back pain, joint pain and myalgias.  Skin:  Negative for rash.  Neurological:  Negative for dizziness, tingling, focal weakness, seizures, weakness and headaches.  Endo/Heme/Allergies:  Does not bruise/bleed easily.  Psychiatric/Behavioral:  Negative for depression and suicidal ideas. The patient does not have insomnia.       Allergies  Allergen Reactions   Hydrocodone-Acetaminophen Itching    Can take if she takes benadryl with it.       Past Medical History:  Diagnosis Date   Anemia    Ankle fracture, left    Arthritis    Complication of anesthesia    a.) delayed emergence with routine colonoscopy; "I was given too much medicine". b.) PONV. c.) postoperative delirium; hallucinations.   Coronary artery calcification seen on CT scan    a.) Coronary CTA 11/03/2011: calcium score 0. b.) LHC 01/03/2013: normal coronary anatomy. c.) CTA 04/04/2021: mild coronary artery calcifications.   Dysrhythmia    Endometriosis    Fibromyalgia    GERD (gastroesophageal reflux disease)    Hearing impaired    Hiatal hernia    History of hiatal hernia    HLD (hyperlipidemia)    HTN (hypertension)    IBS (irritable bowel syndrome)    Mitral valve prolapse  Palpitations    PONV (postoperative nausea and vomiting)    Pulmonary HTN ([place]) 08/21/2020   a.) TTE 08/21/2020: mild elevated PASP of 38 mmHg.   Skin cancer    SOB (shortness of breath)      Past Surgical History:  Procedure Laterality Date   ABDOMINAL HYSTERECTOMY     ANTERIOR CERVICAL DECOMP/DISCECTOMY FUSION N/A 12/03/2021   Procedure: C6-7  ANTERIOR CERVICAL DISCECTOMY AND FUSION (GLOBUS HEDRON);  Surgeon: Meade Maw, MD;  Location: [facility];  Service: Neurosurgery;  Laterality: N/A;   APPENDECTOMY     BACK SURGERY     cervical fusion and at tailbone   BLEPHAROPLASTY Bilateral    BREAST BIOPSY Left 06/08/2022   stereo calcs coil clip path pending   BREAST LUMPECTOMY WITH NEEDLE LOCALIZATION N/A 07/03/2022   Procedure: BREAST LUMPECTOMY WITH NEEDLE LOCALIZATION;  Surgeon: Robert Bellow, MD;  Location: [facility];  Service: General;  Laterality: N/A;   CARDIAC CATHETERIZATION     X 3   CATARACT EXTRACTION, BILATERAL     CHOLECYSTECTOMY     COLONOSCOPY W/ POLYPECTOMY     COLONOSCOPY WITH ESOPHAGOGASTRODUODENOSCOPY (EGD)     FOOT SURGERY Bilateral    KYPHOPLASTY     LAPAROSCOPY     OOPHERECTOMY   LEFT HEART CATH AND CORONARY ANGIOGRAPHY Left 01/03/2013   Procedure: LEFT HEART CATH AND CORONARY ANGIOGRAPHY; Location: [facility]; Surgeon: Kathe Mariner, MD   ORIF WRIST FRACTURE Left 11/18/2020   Procedure: OPEN REDUCTION INTERNAL FIXATION OF LEFT DISTAL RADIUS FRACTURE;  Surgeon: Dereck Leep, MD;  Location: [facility];  Service: Orthopedics;  Laterality: Left;   SEPTOPLASTY     SHOULDER ARTHROSCOPY DISTAL CLAVICLE EXCISION AND OPEN ROTATOR CUFF REPAIR Bilateral    X2   TOTAL HIP ARTHROPLASTY Bilateral     Social History   Socioeconomic History   Marital status: Widowed    Spouse name: Not on file   Number of children: Not on file   Years of education: Not on file   Highest education level: Not on file  Occupational History   Not on file  Tobacco Use   Smoking status: Never   Smokeless tobacco: Never  Vaping Use   Vaping Use: Never used  Substance and Sexual Activity   Alcohol use: Yes    Comment: occasional wine   Drug use: Never   Sexual activity: Not on file  Other Topics Concern   Not on file  Social History Narrative   Not on file   Social Determinants of Health   Financial Resource Strain:  Not on file  Food Insecurity: Not on file  Transportation Needs: Not on file  Physical Activity: Not on file  Stress: Not on file  Social Connections: Not on file  Intimate Partner Violence: Not on file    Family History  Problem Relation Age of Onset   Breast cancer Sister        d. 28s; metastatic   Breast cancer Sister        dx 64   Lymphoma Brother    Breast cancer Maternal Aunt        dx twice   Throat cancer Paternal Aunt    Cervical cancer Paternal Aunt    Breast cancer Cousin        maternal cousin   Cervical cancer Niece      Current Outpatient Medications:    acetaminophen (TYLENOL) 500 MG tablet, Take 1,000 mg by mouth every 6 (six) hours as needed.,  Disp: , Rfl:    amitriptyline (ELAVIL) 25 MG tablet, Take by mouth., Disp: , Rfl:    anastrozole (ARIMIDEX) 1 MG tablet, Take 1 tablet (1 mg total) by mouth daily., Disp: 30 tablet, Rfl: 3   aspirin EC 81 MG tablet, Take by mouth., Disp: , Rfl:    calcium carbonate (TUMS - DOSED IN MG ELEMENTAL CALCIUM) 500 MG chewable tablet, Chew 500 mg by mouth daily as needed for indigestion or heartburn., Disp: , Rfl:    cyanocobalamin (VITAMIN B12) 1000 MCG tablet, Take 1,000 mcg by mouth daily., Disp: , Rfl:    Dietary Management Product (VB6 P5P PO), Take 1 capsule by mouth daily., Disp: , Rfl:    magnesium oxide (MAG-OX) 400 MG tablet, Take 400 mg by mouth daily., Disp: , Rfl:    Melatonin 1 MG CAPS, Take 1 mg by mouth at bedtime., Disp: , Rfl:    melatonin 5 MG TABS, Take 5 mg by mouth., Disp: , Rfl:    metoprolol succinate (TOPROL-XL) 25 MG 24 hr tablet, Take 25 mg by mouth every evening., Disp: , Rfl:    RESTASIS 0.05 % ophthalmic emulsion, Place 1 drop into both eyes 2 (two) times daily., Disp: , Rfl:    rOPINIRole (REQUIP) 0.25 MG tablet, Take 0.25 mg by mouth 2 (two) times daily., Disp: , Rfl:    rosuvastatin (CRESTOR) 5 MG tablet, Take 5 mg by mouth daily., Disp: , Rfl:    topiramate (TOPAMAX) 50 MG tablet, Take 50 mg  by mouth 2 (two) times daily., Disp: , Rfl:    Vitamin D-Vitamin K (K2 PLUS D3 PO), Take 1 tablet by mouth daily., Disp: , Rfl:   Physical exam:  Vitals:   11/17/22 1123 11/17/22 1129  BP: (!) 145/78 107/74  Pulse: 81 81  Temp: (!) 96.6 F (35.9 C)   TempSrc: Tympanic   SpO2: 100%   Weight: 194 lb 14.4 oz (88.4 kg)    Physical Exam Cardiovascular:     Rate and Rhythm: Normal rate and regular rhythm.     Heart sounds: Normal heart sounds.  Pulmonary:     Effort: Pulmonary effort is normal.  Skin:    General: Skin is warm and dry.  Neurological:     Mental Status: She is alert and oriented to person, place, and time.         Latest Ref Rng & Units 11/17/2022   10:50 AM  CMP  Glucose 70 - 99 mg/dL 100   BUN 8 - 23 mg/dL 13   Creatinine 0.44 - 1.00 mg/dL 0.60   Sodium 135 - 145 mmol/L 138   Potassium 3.5 - 5.1 mmol/L 3.9   Chloride 98 - 111 mmol/L 106   CO2 22 - 32 mmol/L 24   Calcium 8.9 - 10.3 mg/dL 8.4   Total Protein 6.5 - 8.1 g/dL 7.1   Total Bilirubin 0.3 - 1.2 mg/dL 0.5   Alkaline Phos 38 - 126 U/L 78   AST 15 - 41 U/L 23   ALT 0 - 44 U/L 18       Latest Ref Rng & Units 09/03/2022    8:56 AM  CBC  WBC 4.0 - 10.5 K/uL 5.0   Hemoglobin 12.0 - 15.0 g/dL 13.5   Hematocrit 36.0 - 46.0 % 40.9   Platelets 150 - 400 K/uL 231      Assessment and plan- Patient is a 77 y.o. female with history of left breast DCIS ER positive status postlumpectomy  and adjuvant radiation treatment here for routine follow-up  Patient has not started taking her aromatase inhibitors yet.  I did counsel her that it would be reasonable to try it and see how she tolerates it.  If she has significant side effects still hot flashes or arthralgias that affect her quality of life and we can consider switching her to an alternative agent or stop it.  Patient is willing to try it and we will resend the prescription for Arimidex.  I will see her back in 4 months no labs.   Visit Diagnosis 1.  Encounter for follow-up surveillance of ductal carcinoma in situ (DCIS) of breast      Dr. Randa Evens, MD, MPH [facility] at [facility] [id number] 11/17/2022 4:21 PM

## 2022-12-07 ENCOUNTER — Other Ambulatory Visit: Payer: Self-pay

## 2022-12-07 DIAGNOSIS — M5412 Radiculopathy, cervical region: Secondary | ICD-10-CM

## 2022-12-07 DIAGNOSIS — M4802 Spinal stenosis, cervical region: Secondary | ICD-10-CM

## 2022-12-08 ENCOUNTER — Ambulatory Visit (INDEPENDENT_AMBULATORY_CARE_PROVIDER_SITE_OTHER): Payer: Medicare Other | Admitting: Neurosurgery

## 2022-12-08 ENCOUNTER — Encounter: Payer: Self-pay | Admitting: Neurosurgery

## 2022-12-08 VITALS — BP 120/72 | HR 82 | Ht 64.0 in | Wt 194.0 lb

## 2022-12-08 DIAGNOSIS — Z09 Encounter for follow-up examination after completed treatment for conditions other than malignant neoplasm: Secondary | ICD-10-CM | POA: Diagnosis not present

## 2022-12-08 DIAGNOSIS — M5412 Radiculopathy, cervical region: Secondary | ICD-10-CM | POA: Diagnosis not present

## 2022-12-08 DIAGNOSIS — M4802 Spinal stenosis, cervical region: Secondary | ICD-10-CM | POA: Diagnosis not present

## 2022-12-08 NOTE — Progress Notes (Signed)
DOS: 12/03/21 C6-7 ACDF  HISTORY OF PRESENT ILLNESS: Suzanne Escobar is status post ACDF. She is doing very well. She is having minimal pain.  PHYSICAL EXAMINATION:  NEUROLOGICAL:  General: In no acute distress.  Awake, alert, oriented to person, place, and time. Pupils equal round and reactive to light.  Strength: Side Biceps Triceps Deltoid Interossei Grip Wrist Ext. Wrist Flex.  R '5 5 5 5 5 5 5  '$ L '5 5 5 5 5 5 5   '$ Incision c/d/I and healing well  Imaging:  Ct Myelogram 02/18/22  IMPRESSION:  1. Anterior cervical fusion from C3 through C7 without hardware  failure or complication.  2. Multilevel cervical spondylosis as described above. Moderate left  foraminal stenosis at C3-C4. Mild left foraminal stenosis at C4-C5.  Moderate bilateral foraminal stenosis at C6-C7.  3.  No acute osseous injury of the cervical spine.   Electronically Signed    By: Kathreen Devoid M.D.    On: 02/18/2022 11:14  I would add to this that she has substantial left-sided facet arthrosis at C1-2.  Cervical spine x-rays on September 1, no evidence of complication  Assessment / Plan: Suzanne Escobar is doing well after ACDF.   This point, she is doing well.  I will see her back as needed.    Meade Maw MD, [facility] Department of Neurosurgery

## 2023-01-22 ENCOUNTER — Other Ambulatory Visit: Payer: Self-pay

## 2023-01-22 ENCOUNTER — Ambulatory Visit
Admission: RE | Admit: 2023-01-22 | Discharge: 2023-01-22 | Disposition: A | Discharge status: Home / Self Care | Payer: Medicare Other | Source: Ambulatory Visit | Attending: Family Medicine | Admitting: Family Medicine

## 2023-01-22 DIAGNOSIS — N200 Calculus of kidney: Secondary | ICD-10-CM | POA: Insufficient documentation

## 2023-02-18 ENCOUNTER — Encounter: Payer: Self-pay | Admitting: *Deleted

## 2023-02-24 ENCOUNTER — Other Ambulatory Visit: Payer: Self-pay | Admitting: General Surgery

## 2023-02-24 DIAGNOSIS — Z853 Personal history of malignant neoplasm of breast: Secondary | ICD-10-CM

## 2023-03-01 ENCOUNTER — Other Ambulatory Visit: Payer: Self-pay | Admitting: Internal Medicine

## 2023-03-01 DIAGNOSIS — M5412 Radiculopathy, cervical region: Secondary | ICD-10-CM

## 2023-03-01 DIAGNOSIS — R1031 Right lower quadrant pain: Secondary | ICD-10-CM

## 2023-03-08 ENCOUNTER — Ambulatory Visit
Admission: RE | Admit: 2023-03-08 | Discharge: 2023-03-08 | Disposition: A | Discharge status: Home / Self Care | Payer: Medicare Other | Source: Ambulatory Visit | Attending: Internal Medicine | Admitting: Internal Medicine

## 2023-03-08 DIAGNOSIS — R1031 Right lower quadrant pain: Secondary | ICD-10-CM | POA: Insufficient documentation

## 2023-03-08 DIAGNOSIS — M5412 Radiculopathy, cervical region: Secondary | ICD-10-CM | POA: Diagnosis present

## 2023-03-08 MED ORDER — IOHEXOL 350 MG/ML SOLN
80.0000 mL | Freq: Once | INTRAVENOUS | Status: AC | PRN
  Administered 2023-03-08: 80 mL via INTRAVENOUS

## 2023-03-15 ENCOUNTER — Encounter: Payer: Self-pay | Admitting: Urology

## 2023-03-15 ENCOUNTER — Ambulatory Visit (INDEPENDENT_AMBULATORY_CARE_PROVIDER_SITE_OTHER): Payer: Medicare Other | Admitting: Urology

## 2023-03-15 VITALS — BP 143/82 | HR 89 | Ht 64.0 in | Wt 194.0 lb

## 2023-03-15 DIAGNOSIS — Z87440 Personal history of urinary (tract) infections: Secondary | ICD-10-CM | POA: Diagnosis not present

## 2023-03-15 DIAGNOSIS — N390 Urinary tract infection, site not specified: Secondary | ICD-10-CM

## 2023-03-15 DIAGNOSIS — Z09 Encounter for follow-up examination after completed treatment for conditions other than malignant neoplasm: Secondary | ICD-10-CM

## 2023-03-15 NOTE — Patient Instructions (Signed)

## 2023-03-15 NOTE — Progress Notes (Signed)
03/15/2023 1:39 PM   Suzanne Escobar Aug 10, 1946 [id number]  Referring provider: Enid Baas, MD [address] [place],  [place] [address]  Chief Complaint  Patient presents with   New Patient (Initial Visit)    HPI: I was consulted to assess the patient's urinary tract infections.  She was a bit nonspecific but she says she has been treated 4 times this year.  She gets burning and frequency that temporarily respond to an antibiotic.  She also has right-sided flank pain and back pain.  At baseline she is continent and voids every 2 hours gets up once a night reports a good flow  She has had 5 neck surgery and 1 low back surgery.  She has had a hysterectomy  She had a normal CT of the abdomen pelvis in June 2024.  I could not find cultures in the chart.  She does not think she is infected today   PMH: Past Medical History:  Diagnosis Date   Anemia    Ankle fracture, left    Arthritis    Complication of anesthesia    a.) delayed emergence with routine colonoscopy; "I was given too much medicine". b.) PONV. c.) postoperative delirium; hallucinations.   Coronary artery calcification seen on CT scan    a.) Coronary CTA 11/03/2011: calcium score 0. b.) LHC 01/03/2013: normal coronary anatomy. c.) CTA 04/04/2021: mild coronary artery calcifications.   Dysrhythmia    Endometriosis    Fibromyalgia    GERD (gastroesophageal reflux disease)    Hearing impaired    Hiatal hernia    History of hiatal hernia    HLD (hyperlipidemia)    HTN (hypertension)    IBS (irritable bowel syndrome)    Mitral valve prolapse    Palpitations    PONV (postoperative nausea and vomiting)    Pulmonary HTN ([facility]) 08/21/2020   a.) TTE 08/21/2020: mild elevated PASP of 38 mmHg.   Skin cancer    SOB (shortness of breath)     Surgical History: Past Surgical History:  Procedure Laterality Date   ABDOMINAL HYSTERECTOMY     ANTERIOR CERVICAL DECOMP/DISCECTOMY FUSION N/A 12/03/2021    Procedure: C6-7 ANTERIOR CERVICAL DISCECTOMY AND FUSION (GLOBUS HEDRON);  Surgeon: Venetia Night, MD;  Location: [facility];  Service: Neurosurgery;  Laterality: N/A;   APPENDECTOMY     BACK SURGERY     cervical fusion and at tailbone   BLEPHAROPLASTY Bilateral    BREAST BIOPSY Left 06/08/2022   stereo calcs coil clip path pending   BREAST LUMPECTOMY WITH NEEDLE LOCALIZATION N/A 07/03/2022   Procedure: BREAST LUMPECTOMY WITH NEEDLE LOCALIZATION;  Surgeon: Earline Mayotte, MD;  Location: [facility];  Service: General;  Laterality: N/A;   CARDIAC CATHETERIZATION     X 3   CATARACT EXTRACTION, BILATERAL     CHOLECYSTECTOMY     COLONOSCOPY W/ POLYPECTOMY     COLONOSCOPY WITH ESOPHAGOGASTRODUODENOSCOPY (EGD)     FOOT SURGERY Bilateral    KYPHOPLASTY     LAPAROSCOPY     OOPHERECTOMY   LEFT HEART CATH AND CORONARY ANGIOGRAPHY Left 01/03/2013   Procedure: LEFT HEART CATH AND CORONARY ANGIOGRAPHY; Location: [facility]; Surgeon: Alfonse Alpers, MD   ORIF WRIST FRACTURE Left 11/18/2020   Procedure: OPEN REDUCTION INTERNAL FIXATION OF LEFT DISTAL RADIUS FRACTURE;  Surgeon: Donato Heinz, MD;  Location: [facility];  Service: Orthopedics;  Laterality: Left;   SEPTOPLASTY     SHOULDER ARTHROSCOPY DISTAL CLAVICLE EXCISION AND OPEN ROTATOR CUFF REPAIR Bilateral  X2   TOTAL HIP ARTHROPLASTY Bilateral     Home Medications:  Allergies as of 03/15/2023       Reactions   Hydrocodone-acetaminophen Itching   Can take if she takes benadryl with it.        Medication List        Accurate as of March 15, 2023  1:39 PM. If you have any questions, ask your nurse or doctor.          acetaminophen 500 MG tablet Commonly known as: TYLENOL Take 1,000 mg by mouth every 6 (six) hours as needed.   anastrozole 1 MG tablet Commonly known as: ARIMIDEX Take 1 tablet (1 mg total) by mouth daily.   aspirin EC 81 MG tablet Take by mouth.   calcium carbonate 500 MG chewable tablet Commonly known as:  TUMS - dosed in mg elemental calcium Chew 500 mg by mouth daily as needed for indigestion or heartburn.   cyanocobalamin 1000 MCG tablet Commonly known as: VITAMIN B12 Take 1,000 mcg by mouth daily.   furosemide 20 MG tablet Commonly known as: LASIX Take by mouth.   K2 PLUS D3 PO Take 1 tablet by mouth daily.   magnesium oxide 400 MG tablet Commonly known as: MAG-OX Take 400 mg by mouth daily.   melatonin 5 MG Tabs Take 5 mg by mouth. What changed: Another medication with the same name was removed. Continue taking this medication, and follow the directions you see here. Changed by: Martina Sinner, MD   metoprolol succinate 25 MG 24 hr tablet Commonly known as: TOPROL-XL Take 25 mg by mouth every evening.   Restasis 0.05 % ophthalmic emulsion Generic drug: cycloSPORINE Place 1 drop into both eyes 2 (two) times daily.   rOPINIRole 0.25 MG tablet Commonly known as: REQUIP Take 0.25 mg by mouth 2 (two) times daily.   rosuvastatin 5 MG tablet Commonly known as: CRESTOR Take 5 mg by mouth daily.   VB6 P5P PO Take 1 capsule by mouth daily.        Allergies:  Allergies  Allergen Reactions   Hydrocodone-Acetaminophen Itching    Can take if she takes benadryl with it.      Family History: Family History  Problem Relation Age of Onset   Breast cancer Sister        d. 63s; metastatic   Breast cancer Sister        dx 38   Lymphoma Brother    Breast cancer Maternal Aunt        dx twice   Throat cancer Paternal Aunt    Cervical cancer Paternal Aunt    Breast cancer Cousin        maternal cousin   Cervical cancer Niece     Social History:  reports that she has never smoked. She has never used smokeless tobacco. She reports current alcohol use. She reports that she does not use drugs.  ROS:                                        Physical Exam: BP (!) 143/82   Pulse 89   Ht 5\' 4"  (1.626 m)   Wt 88 kg   BMI 33.30 kg/m    Constitutional:  Alert and oriented, No acute distress. HEENT: [place] AT, moist mucus membranes.  Trachea midline, no masses.  Laboratory Data: Lab Results  Component Value Date  WBC 5.0 09/03/2022   HGB 13.5 09/03/2022   HCT 40.9 09/03/2022   MCV 90.7 09/03/2022   PLT 231 09/03/2022    Lab Results  Component Value Date   CREATININE 0.60 11/17/2022    No results found for: "PSA"  No results found for: "TESTOSTERONE"  No results found for: "HGBA1C"  Urinalysis    Component Value Date/Time   COLORURINE YELLOW (A) 11/19/2021 1352   APPEARANCEUR CLEAR (A) 11/19/2021 1352   LABSPEC 1.010 11/19/2021 1352   PHURINE 6.0 11/19/2021 1352   GLUCOSEU NEGATIVE 11/19/2021 1352   HGBUR NEGATIVE 11/19/2021 1352   BILIRUBINUR NEGATIVE 11/19/2021 1352   KETONESUR NEGATIVE 11/19/2021 1352   PROTEINUR NEGATIVE 11/19/2021 1352   NITRITE NEGATIVE 11/19/2021 1352   LEUKOCYTESUR NEGATIVE 11/19/2021 1352    Pertinent Imaging:   Assessment & Plan: Pathophysiology of UTIs discussed.  Could not get a sample today.  Reassess in 6 weeks trimethoprim 100 mg 3 x 11 for pelvic examination cystoscopy.  We will proceed accordingly.  She understands right flank pain is not from the genitourinary cause.  1. Urinary tract infection without hematuria, site unspecified  - Urinalysis, Complete   No follow-ups on file.  Martina Sinner, MD  [facility] [address], [address] [place], [place] [address] [phone]

## 2023-03-19 ENCOUNTER — Inpatient Hospital Stay: Payer: Medicare Other | Attending: Oncology | Admitting: Medical Oncology

## 2023-03-19 ENCOUNTER — Other Ambulatory Visit: Payer: Self-pay

## 2023-03-19 ENCOUNTER — Encounter: Payer: Self-pay | Admitting: Medical Oncology

## 2023-03-19 VITALS — BP 141/74 | HR 71 | Temp 98.1°F | Resp 20 | Ht 64.0 in | Wt 191.0 lb

## 2023-03-19 DIAGNOSIS — Z86000 Personal history of in-situ neoplasm of breast: Secondary | ICD-10-CM

## 2023-03-19 DIAGNOSIS — Z90710 Acquired absence of both cervix and uterus: Secondary | ICD-10-CM | POA: Insufficient documentation

## 2023-03-19 DIAGNOSIS — Z807 Family history of other malignant neoplasms of lymphoid, hematopoietic and related tissues: Secondary | ICD-10-CM | POA: Insufficient documentation

## 2023-03-19 DIAGNOSIS — Z08 Encounter for follow-up examination after completed treatment for malignant neoplasm: Secondary | ICD-10-CM

## 2023-03-19 DIAGNOSIS — Z8049 Family history of malignant neoplasm of other genital organs: Secondary | ICD-10-CM | POA: Diagnosis not present

## 2023-03-19 DIAGNOSIS — Z79811 Long term (current) use of aromatase inhibitors: Secondary | ICD-10-CM | POA: Insufficient documentation

## 2023-03-19 DIAGNOSIS — D0512 Intraductal carcinoma in situ of left breast: Secondary | ICD-10-CM | POA: Insufficient documentation

## 2023-03-19 DIAGNOSIS — R5383 Other fatigue: Secondary | ICD-10-CM | POA: Diagnosis not present

## 2023-03-19 DIAGNOSIS — Z923 Personal history of irradiation: Secondary | ICD-10-CM | POA: Insufficient documentation

## 2023-03-19 DIAGNOSIS — Z803 Family history of malignant neoplasm of breast: Secondary | ICD-10-CM | POA: Insufficient documentation

## 2023-03-19 DIAGNOSIS — Z800 Family history of malignant neoplasm of digestive organs: Secondary | ICD-10-CM | POA: Diagnosis not present

## 2023-03-19 NOTE — Progress Notes (Signed)
Hematology/Oncology Consult note [facility]  Telephone:([phone] Fax:([phone]  Patient Care Team: Enid Baas, MD as PCP - General (Internal Medicine) Hulen Luster, RN as Oncology Nurse Navigator   Name of the patient: Suzanne Escobar  [id number]  1946-07-12   Date of visit: 03/19/23  Diagnosis- left breast DCIS ER positive   Chief complaint/ Reason for visit-routine follow-up of DCIS  Heme/Onc history: patient is a 77 year old female who underwent a routine screening marrow gram in July 2023 which showed suspicious calcifications in her left breast.  This was followed by diagnostic mammogram which showed 10 mm group of calcifications in the upper outer quadrant of the left breast.  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  Patient has met with Dr. Lemar Livings and will be undergoing lumpectomy at some point next month.  She is otherwise doing well for her age.  There is a family history of breast cancer in 2 of her sisters.  Menarche at the age of 56.  She is G3 P3.  Age at first birth 12.  She is not presently on hormone replacement therapy.  No prior history of breast biopsies.   Final pathology showed 16 mm grade 2 DCIS with close   Inferior-anterior margin of 0.5 mm. No evidence of invasive malignancy.  Patient completed adjuvant radiation therapy.  She has not started endocrine therapy yet baseline bone density scan showed osteopenia  Interval history- Patient states that she has been doing ok since her last visit. She has started the Anastrazole and is tolerating it fairy well. Has had some fatigue but otherwise well. She reports no new breast concerns. No unintentional weight loss, night sweats.   ECOG PS- 1 Pain scale- 3   Review of systems- Review of Systems  Constitutional:  Negative for chills, fever, malaise/fatigue and weight loss.  HENT:  Negative for congestion, ear discharge and nosebleeds.   Eyes:  Negative for blurred  vision.  Respiratory:  Negative for cough, hemoptysis, sputum production, shortness of breath and wheezing.   Cardiovascular:  Negative for chest pain, palpitations, orthopnea and claudication.  Gastrointestinal:  Negative for abdominal pain, blood in stool, constipation, diarrhea, heartburn, melena, nausea and vomiting.  Genitourinary:  Negative for dysuria, flank pain, frequency, hematuria and urgency.  Musculoskeletal:  Negative for back pain, joint pain and myalgias.  Skin:  Negative for rash.  Neurological:  Negative for dizziness, tingling, focal weakness, seizures, weakness and headaches.  Endo/Heme/Allergies:  Does not bruise/bleed easily.  Psychiatric/Behavioral:  Negative for depression and suicidal ideas. The patient does not have insomnia.       Allergies  Allergen Reactions   Hydrocodone-Acetaminophen Itching    Can take if she takes benadryl with it.       Past Medical History:  Diagnosis Date   Anemia    Ankle fracture, left    Arthritis    Complication of anesthesia    a.) delayed emergence with routine colonoscopy; "I was given too much medicine". b.) PONV. c.) postoperative delirium; hallucinations.   Coronary artery calcification seen on CT scan    a.) Coronary CTA 11/03/2011: calcium score 0. b.) LHC 01/03/2013: normal coronary anatomy. c.) CTA 04/04/2021: mild coronary artery calcifications.   Dysrhythmia    Endometriosis    Fibromyalgia    GERD (gastroesophageal reflux disease)    Hearing impaired    Hiatal hernia    History of hiatal hernia    HLD (hyperlipidemia)    HTN (hypertension)  IBS (irritable bowel syndrome)    Mitral valve prolapse    Palpitations    PONV (postoperative nausea and vomiting)    Pulmonary HTN (HCC) 08/21/2020   a.) TTE 08/21/2020: mild elevated PASP of 38 mmHg.   Skin cancer    SOB (shortness of breath)      Past Surgical History:  Procedure Laterality Date   ABDOMINAL HYSTERECTOMY     ANTERIOR CERVICAL  DECOMP/DISCECTOMY FUSION N/A 12/03/2021   Procedure: C6-7 ANTERIOR CERVICAL DISCECTOMY AND FUSION (GLOBUS HEDRON);  Surgeon: Venetia Night, MD;  Location: [facility];  Service: Neurosurgery;  Laterality: N/A;   APPENDECTOMY     BACK SURGERY     cervical fusion and at tailbone   BLEPHAROPLASTY Bilateral    BREAST BIOPSY Left 06/08/2022   stereo calcs coil clip path pending   BREAST LUMPECTOMY WITH NEEDLE LOCALIZATION N/A 07/03/2022   Procedure: BREAST LUMPECTOMY WITH NEEDLE LOCALIZATION;  Surgeon: Earline Mayotte, MD;  Location: [facility];  Service: General;  Laterality: N/A;   CARDIAC CATHETERIZATION     X 3   CATARACT EXTRACTION, BILATERAL     CHOLECYSTECTOMY     COLONOSCOPY W/ POLYPECTOMY     COLONOSCOPY WITH ESOPHAGOGASTRODUODENOSCOPY (EGD)     FOOT SURGERY Bilateral    KYPHOPLASTY     LAPAROSCOPY     OOPHERECTOMY   LEFT HEART CATH AND CORONARY ANGIOGRAPHY Left 01/03/2013   Procedure: LEFT HEART CATH AND CORONARY ANGIOGRAPHY; Location: [facility]; Surgeon: Alfonse Alpers, MD   ORIF WRIST FRACTURE Left 11/18/2020   Procedure: OPEN REDUCTION INTERNAL FIXATION OF LEFT DISTAL RADIUS FRACTURE;  Surgeon: Donato Heinz, MD;  Location: [facility];  Service: Orthopedics;  Laterality: Left;   SEPTOPLASTY     SHOULDER ARTHROSCOPY DISTAL CLAVICLE EXCISION AND OPEN ROTATOR CUFF REPAIR Bilateral    X2   TOTAL HIP ARTHROPLASTY Bilateral     Social History   Socioeconomic History   Marital status: Widowed    Spouse name: Not on file   Number of children: Not on file   Years of education: Not on file   Highest education level: Not on file  Occupational History   Not on file  Tobacco Use   Smoking status: Never   Smokeless tobacco: Never  Vaping Use   Vaping Use: Never used  Substance and Sexual Activity   Alcohol use: Yes    Comment: occasional wine   Drug use: Never   Sexual activity: Not on file  Other Topics Concern   Not on file  Social History Narrative   Not on file    Social Determinants of Health   Financial Resource Strain: Not on file  Food Insecurity: Not on file  Transportation Needs: Not on file  Physical Activity: Not on file  Stress: Not on file  Social Connections: Not on file  Intimate Partner Violence: Not on file    Family History  Problem Relation Age of Onset   Breast cancer Sister        d. 33s; metastatic   Breast cancer Sister        dx 11   Lymphoma Brother    Breast cancer Maternal Aunt        dx twice   Throat cancer Paternal Aunt    Cervical cancer Paternal Aunt    Breast cancer Cousin        maternal cousin   Cervical cancer Niece      Current Outpatient Medications:    acetaminophen (TYLENOL) 500  MG tablet, Take 1,000 mg by mouth every 6 (six) hours as needed., Disp: , Rfl:    anastrozole (ARIMIDEX) 1 MG tablet, Take 1 tablet (1 mg total) by mouth daily., Disp: 30 tablet, Rfl: 3   cyanocobalamin (VITAMIN B12) 1000 MCG tablet, Take 1,000 mcg by mouth daily., Disp: , Rfl:    magnesium oxide (MAG-OX) 400 MG tablet, Take 400 mg by mouth daily., Disp: , Rfl:    melatonin 5 MG TABS, Take 5 mg by mouth., Disp: , Rfl:    metoprolol succinate (TOPROL-XL) 25 MG 24 hr tablet, Take 25 mg by mouth every evening., Disp: , Rfl:    rosuvastatin (CRESTOR) 5 MG tablet, Take 5 mg by mouth daily., Disp: , Rfl:    Vitamin D-Vitamin K (K2 PLUS D3 PO), Take 1 tablet by mouth daily., Disp: , Rfl:    calcium carbonate (TUMS - DOSED IN MG ELEMENTAL CALCIUM) 500 MG chewable tablet, Chew 500 mg by mouth daily as needed for indigestion or heartburn. (Patient not taking: Reported on 03/19/2023), Disp: , Rfl:    Dietary Management Product (VB6 P5P PO), Take 1 capsule by mouth daily. (Patient not taking: Reported on 03/19/2023), Disp: , Rfl:    furosemide (LASIX) 20 MG tablet, Take by mouth. (Patient not taking: Reported on 03/19/2023), Disp: , Rfl:    RESTASIS 0.05 % ophthalmic emulsion, Place 1 drop into both eyes 2 (two) times daily. (Patient  not taking: Reported on 03/19/2023), Disp: , Rfl:   Physical exam:  Vitals:   03/19/23 1115  BP: (!) 141/74  Pulse: 71  Resp: 20  Temp: 98.1 F (36.7 C)  TempSrc: Tympanic  Weight: 191 lb (86.6 kg)  Height: 5\' 4"  (1.626 m)   Physical Exam Cardiovascular:     Rate and Rhythm: Normal rate and regular rhythm.     Heart sounds: Normal heart sounds.  Pulmonary:     Effort: Pulmonary effort is normal.  Skin:    General: Skin is warm and dry.  Neurological:     Mental Status: She is alert and oriented to person, place, and time.         Latest Ref Rng & Units 11/17/2022   10:50 AM  CMP  Glucose 70 - 99 mg/dL 119   BUN 8 - 23 mg/dL 13   Creatinine 1.47 - 1.00 mg/dL 8.29   Sodium 562 - 130 mmol/L 138   Potassium 3.5 - 5.1 mmol/L 3.9   Chloride 98 - 111 mmol/L 106   CO2 22 - 32 mmol/L 24   Calcium 8.9 - 10.3 mg/dL 8.4   Total Protein 6.5 - 8.1 g/dL 7.1   Total Bilirubin 0.3 - 1.2 mg/dL 0.5   Alkaline Phos 38 - 126 U/L 78   AST 15 - 41 U/L 23   ALT 0 - 44 U/L 18       Latest Ref Rng & Units 09/03/2022    8:56 AM  CBC  WBC 4.0 - 10.5 K/uL 5.0   Hemoglobin 12.0 - 15.0 g/dL 86.5   Hematocrit 78.4 - 46.0 % 40.9   Platelets 150 - 400 K/uL 231      Assessment and plan- Patient is a 77 y.o. female with history of left breast DCIS ER positive status postlumpectomy and adjuvant radiation treatment here for routine follow-up  Patient is doing well. No sign of recurrence today. I am happy to hear that she has started her Anastrazole. We discussed that her fatigue may improve within the first  6 months of therapy. In terms of surveillance she has her next mammogram scheduled for August and she continues to be followed by her breast surgeon.   RTC 4 months MD, labs (CBC w/, CMP)-[place]    Visit Diagnosis 1. Encounter for follow-up surveillance of ductal carcinoma in situ (DCIS) of breast    Jannetta Quint [facility] [phone] 03/19/2023 2:08  PM

## 2023-03-22 ENCOUNTER — Encounter: Payer: Self-pay | Admitting: *Deleted

## 2023-04-09 ENCOUNTER — Encounter: Payer: Self-pay | Admitting: Oncology

## 2023-04-12 NOTE — Telephone Encounter (Signed)
Suzanne Escobar- I believe you will call her as per our discussion and switch to an alternative drug?

## 2023-04-13 ENCOUNTER — Other Ambulatory Visit: Payer: Self-pay | Admitting: *Deleted

## 2023-04-13 MED ORDER — LETROZOLE 2.5 MG PO TABS: 2.5000 mg | ORAL_TABLET | Freq: Every day | ORAL | 3 refills | Status: DC

## 2023-04-29 ENCOUNTER — Ambulatory Visit
Admission: RE | Admit: 2023-04-29 | Discharge: 2023-04-29 | Disposition: A | Discharge status: Home / Self Care | Payer: Medicare Other | Source: Ambulatory Visit | Attending: General Surgery | Admitting: General Surgery

## 2023-04-29 DIAGNOSIS — Z853 Personal history of malignant neoplasm of breast: Secondary | ICD-10-CM | POA: Diagnosis present

## 2023-04-29 HISTORY — DX: Personal history of irradiation: Z92.3

## 2023-05-03 ENCOUNTER — Other Ambulatory Visit: Payer: Medicare Other | Admitting: Urology

## 2023-05-03 ENCOUNTER — Encounter: Payer: Self-pay | Admitting: Urology

## 2023-05-03 ENCOUNTER — Ambulatory Visit (INDEPENDENT_AMBULATORY_CARE_PROVIDER_SITE_OTHER): Payer: Medicare Other | Admitting: Urology

## 2023-05-03 VITALS — BP 125/76 | HR 99 | Ht 64.0 in | Wt 191.0 lb

## 2023-05-03 DIAGNOSIS — N390 Urinary tract infection, site not specified: Secondary | ICD-10-CM

## 2023-05-03 DIAGNOSIS — Z87440 Personal history of urinary (tract) infections: Secondary | ICD-10-CM

## 2023-05-03 DIAGNOSIS — Z09 Encounter for follow-up examination after completed treatment for conditions other than malignant neoplasm: Secondary | ICD-10-CM | POA: Diagnosis not present

## 2023-05-03 LAB — URINALYSIS, COMPLETE
Bilirubin, UA: NEGATIVE
Glucose, UA: NEGATIVE
Ketones, UA: NEGATIVE
Leukocytes,UA: NEGATIVE
Nitrite, UA: NEGATIVE
Protein,UA: NEGATIVE
Specific Gravity, UA: 1.01 (ref 1.005–1.030)
Urobilinogen, Ur: 0.2 mg/dL (ref 0.2–1.0)
pH, UA: 5.5 (ref 5.0–7.5)

## 2023-05-03 LAB — MICROSCOPIC EXAMINATION: Bacteria, UA: NONE SEEN

## 2023-05-03 MED ORDER — TRIMETHOPRIM 100 MG PO TABS: 100.0000 mg | ORAL_TABLET | Freq: Every day | ORAL | 11 refills | Status: AC

## 2023-05-03 NOTE — Progress Notes (Signed)
05/03/2023 10:06 AM   Suzanne Escobar 09-18-1946 [id number]  Referring provider: Enid Baas, MD [address] [place],  [place] [address]  Chief Complaint  Patient presents with   Cysto    HPI:  was consulted to assess the patient's urinary tract infections.  She was a bit nonspecific but she says she has been treated 4 times this year.  She gets burning and frequency that temporarily respond to an antibiotic.  She also has right-sided flank pain and back pain.   At baseline she is continent and voids every 2 hours gets up once a night reports a good flow   She has had 5 neck surgery and 1 low back surgery.  She has had a hysterectomy   She had a normal CT of the abdomen pelvis in June 2024.  I could not find cultures in the chart.  She does not think she is infected today  Pathophysiology of UTIs discussed. Could not get a sample today. Reassess in 6 weeks trimethoprim 100 mg 3 x 11 for pelvic examination cystoscopy. We will proceed accordingly. She understands right flank pain is not from the genitourinary cause.      Today Frequency stable.  No recent culture Clinically not infected.  Patient says she never got the trimethoprim. On pelvic examination she grade 2 hypermobility the bladder neck, small cystocele.  No stress incontinence Cystoscopy.  Patient underwent flexible cystoscopy.  Bladder mucosa and trigone were normal.  No carcinoma.  Few white flecks in urine.  No obvious cystitis     PMH: Past Medical History:  Diagnosis Date   Anemia    Ankle fracture, left    Arthritis    Complication of anesthesia    a.) delayed emergence with routine colonoscopy; "I was given too much medicine". b.) PONV. c.) postoperative delirium; hallucinations.   Coronary artery calcification seen on CT scan    a.) Coronary CTA 11/03/2011: calcium score 0. b.) LHC 01/03/2013: normal coronary anatomy. c.) CTA 04/04/2021: mild coronary artery calcifications.   Dysrhythmia     Endometriosis    Fibromyalgia    GERD (gastroesophageal reflux disease)    Hearing impaired    Hiatal hernia    History of hiatal hernia    HLD (hyperlipidemia)    HTN (hypertension)    IBS (irritable bowel syndrome)    Mitral valve prolapse    Palpitations    Personal history of radiation therapy    PONV (postoperative nausea and vomiting)    Pulmonary HTN (HCC) 08/21/2020   a.) TTE 08/21/2020: mild elevated PASP of 38 mmHg.   Skin cancer    SOB (shortness of breath)     Surgical History: Past Surgical History:  Procedure Laterality Date   ABDOMINAL HYSTERECTOMY     ANTERIOR CERVICAL DECOMP/DISCECTOMY FUSION N/A 12/03/2021   Procedure: C6-7 ANTERIOR CERVICAL DISCECTOMY AND FUSION (GLOBUS HEDRON);  Surgeon: Venetia Night, MD;  Location: [facility];  Service: Neurosurgery;  Laterality: N/A;   APPENDECTOMY     BACK SURGERY     cervical fusion and at tailbone   BLEPHAROPLASTY Bilateral    BREAST BIOPSY Left 06/08/2022   stereo calcs coil clip path pending   BREAST LUMPECTOMY     BREAST LUMPECTOMY WITH NEEDLE LOCALIZATION N/A 07/03/2022   Procedure: BREAST LUMPECTOMY WITH NEEDLE LOCALIZATION;  Surgeon: Earline Mayotte, MD;  Location: [facility];  Service: General;  Laterality: N/A;   CARDIAC CATHETERIZATION     X 3   CATARACT EXTRACTION, BILATERAL  CHOLECYSTECTOMY     COLONOSCOPY W/ POLYPECTOMY     COLONOSCOPY WITH ESOPHAGOGASTRODUODENOSCOPY (EGD)     FOOT SURGERY Bilateral    KYPHOPLASTY     LAPAROSCOPY     OOPHERECTOMY   LEFT HEART CATH AND CORONARY ANGIOGRAPHY Left 01/03/2013   Procedure: LEFT HEART CATH AND CORONARY ANGIOGRAPHY; Location: [facility]; Surgeon: Alfonse Alpers, MD   ORIF WRIST FRACTURE Left 11/18/2020   Procedure: OPEN REDUCTION INTERNAL FIXATION OF LEFT DISTAL RADIUS FRACTURE;  Surgeon: Donato Heinz, MD;  Location: [facility];  Service: Orthopedics;  Laterality: Left;   SEPTOPLASTY     SHOULDER ARTHROSCOPY DISTAL CLAVICLE EXCISION AND OPEN ROTATOR  CUFF REPAIR Bilateral    X2   TOTAL HIP ARTHROPLASTY Bilateral     Home Medications:  Allergies as of 05/03/2023       Reactions   Hydrocodone-acetaminophen Itching   Can take if she takes benadryl with it.        Medication List        Accurate as of May 03, 2023 10:06 AM. If you have any questions, ask your nurse or doctor.          acetaminophen 500 MG tablet Commonly known as: TYLENOL Take 1,000 mg by mouth every 6 (six) hours as needed.   anastrozole 1 MG tablet Commonly known as: ARIMIDEX Take 1 tablet (1 mg total) by mouth daily.   calcium carbonate 500 MG chewable tablet Commonly known as: TUMS - dosed in mg elemental calcium Chew 500 mg by mouth daily as needed for indigestion or heartburn.   cyanocobalamin 1000 MCG tablet Commonly known as: VITAMIN B12 Take 1,000 mcg by mouth daily.   furosemide 20 MG tablet Commonly known as: LASIX Take by mouth.   K2 PLUS D3 PO Take 1 tablet by mouth daily.   letrozole 2.5 MG tablet Commonly known as: FEMARA Take 1 tablet (2.5 mg total) by mouth daily.   magnesium oxide 400 MG tablet Commonly known as: MAG-OX Take 400 mg by mouth daily.   melatonin 5 MG Tabs Take 5 mg by mouth.   metoprolol succinate 25 MG 24 hr tablet Commonly known as: TOPROL-XL Take 25 mg by mouth every evening.   Restasis 0.05 % ophthalmic emulsion Generic drug: cycloSPORINE Place 1 drop into both eyes 2 (two) times daily.   rosuvastatin 5 MG tablet Commonly known as: CRESTOR Take 5 mg by mouth daily.   VB6 P5P PO Take 1 capsule by mouth daily.        Allergies:  Allergies  Allergen Reactions   Hydrocodone-Acetaminophen Itching    Can take if she takes benadryl with it.      Family History: Family History  Problem Relation Age of Onset   Breast cancer Sister        d. 89s; metastatic   Breast cancer Sister        dx 2   Lymphoma Brother    Breast cancer Maternal Aunt        dx twice   Throat cancer  Paternal Aunt    Cervical cancer Paternal Aunt    Breast cancer Cousin        maternal cousin   Cervical cancer Niece     Social History:  reports that she has never smoked. She has never used smokeless tobacco. She reports current alcohol use. She reports that she does not use drugs.  ROS:  Physical Exam: There were no vitals taken for this visit.  Constitutional:  Alert and oriented, No acute distress.   Laboratory Data: Lab Results  Component Value Date   WBC 5.0 09/03/2022   HGB 13.5 09/03/2022   HCT 40.9 09/03/2022   MCV 90.7 09/03/2022   PLT 231 09/03/2022    Lab Results  Component Value Date   CREATININE 0.60 11/17/2022    No results found for: "PSA"  No results found for: "TESTOSTERONE"  No results found for: "HGBA1C"  Urinalysis    Component Value Date/Time   COLORURINE YELLOW (A) 11/19/2021 1352   APPEARANCEUR CLEAR (A) 11/19/2021 1352   LABSPEC 1.010 11/19/2021 1352   PHURINE 6.0 11/19/2021 1352   GLUCOSEU NEGATIVE 11/19/2021 1352   HGBUR NEGATIVE 11/19/2021 1352   BILIRUBINUR NEGATIVE 11/19/2021 1352   KETONESUR NEGATIVE 11/19/2021 1352   PROTEINUR NEGATIVE 11/19/2021 1352   NITRITE NEGATIVE 11/19/2021 1352   LEUKOCYTESUR NEGATIVE 11/19/2021 1352    Pertinent Imaging:   Assessment & Plan: Send urine for culture.  Reevaluate in 8 weeks on trimethoprim 100 mg 30 x 11  1. Urinary tract infection without hematuria, site unspecified  - Urinalysis, Complete   No follow-ups on file.  Martina Sinner, MD  [facility] [address] [place], [place] [address] [phone]

## 2023-05-03 NOTE — Addendum Note (Signed)
Addended byRanda Lynn on: 05/03/2023 10:43 AM   Modules accepted: Orders

## 2023-05-15 ENCOUNTER — Emergency Department: Payer: Medicare Other

## 2023-05-15 ENCOUNTER — Encounter: Payer: Self-pay | Admitting: Emergency Medicine

## 2023-05-15 ENCOUNTER — Other Ambulatory Visit: Payer: Self-pay

## 2023-05-15 DIAGNOSIS — I341 Nonrheumatic mitral (valve) prolapse: Secondary | ICD-10-CM | POA: Diagnosis not present

## 2023-05-15 DIAGNOSIS — R0789 Other chest pain: Secondary | ICD-10-CM | POA: Diagnosis present

## 2023-05-15 DIAGNOSIS — I2510 Atherosclerotic heart disease of native coronary artery without angina pectoris: Secondary | ICD-10-CM | POA: Diagnosis not present

## 2023-05-15 LAB — CBC WITH DIFFERENTIAL/PLATELET
Abs Immature Granulocytes: 0.02 10*3/uL (ref 0.00–0.07)
Basophils Absolute: 0 10*3/uL (ref 0.0–0.1)
Basophils Relative: 1 %
Eosinophils Absolute: 0.1 10*3/uL (ref 0.0–0.5)
Eosinophils Relative: 1 %
HCT: 37.7 % (ref 36.0–46.0)
Hemoglobin: 12.6 g/dL (ref 12.0–15.0)
Immature Granulocytes: 0 %
Lymphocytes Relative: 40 %
Lymphs Abs: 2.2 10*3/uL (ref 0.7–4.0)
MCH: 30.4 pg (ref 26.0–34.0)
MCHC: 33.4 g/dL (ref 30.0–36.0)
MCV: 91.1 fL (ref 80.0–100.0)
Monocytes Absolute: 0.6 10*3/uL (ref 0.1–1.0)
Monocytes Relative: 11 %
Neutro Abs: 2.7 10*3/uL (ref 1.7–7.7)
Neutrophils Relative %: 47 %
Platelets: 214 10*3/uL (ref 150–400)
RBC: 4.14 MIL/uL (ref 3.87–5.11)
RDW: 13.7 % (ref 11.5–15.5)
WBC: 5.6 10*3/uL (ref 4.0–10.5)
nRBC: 0 % (ref 0.0–0.2)

## 2023-05-15 NOTE — ED Triage Notes (Signed)
Pt presents to triage for centralized CP that started tonight. The patient endorses "heaviness" and GERD tonight. Rates the pain 2/10 and received 324mg  ASA PTA. A&Ox4 at this time. Denies SOB.

## 2023-05-16 ENCOUNTER — Emergency Department
Admission: EM | Admit: 2023-05-16 | Discharge: 2023-05-16 | Disposition: A | Discharge status: Home / Self Care | Payer: Medicare Other | Attending: Emergency Medicine | Admitting: Emergency Medicine

## 2023-05-16 DIAGNOSIS — I341 Nonrheumatic mitral (valve) prolapse: Secondary | ICD-10-CM

## 2023-05-16 DIAGNOSIS — R0789 Other chest pain: Secondary | ICD-10-CM

## 2023-05-16 LAB — COMPREHENSIVE METABOLIC PANEL
ALT: 13 U/L (ref 0–44)
AST: 24 U/L (ref 15–41)
Albumin: 3.8 g/dL (ref 3.5–5.0)
Alkaline Phosphatase: 56 U/L (ref 38–126)
Anion gap: 11 (ref 5–15)
BUN: 15 mg/dL (ref 8–23)
CO2: 23 mmol/L (ref 22–32)
Calcium: 8.6 mg/dL — ABNORMAL LOW (ref 8.9–10.3)
Chloride: 105 mmol/L (ref 98–111)
Creatinine, Ser: 0.74 mg/dL (ref 0.44–1.00)
GFR, Estimated: >60 mL/min (ref >60)
Glucose, Bld: 107 mg/dL — ABNORMAL HIGH (ref 70–99)
Potassium: 4.3 mmol/L (ref 3.5–5.1)
Sodium: 139 mmol/L (ref 135–145)
Total Bilirubin: 0.8 mg/dL (ref 0.3–1.2)
Total Protein: 6.9 g/dL (ref 6.5–8.1)

## 2023-05-16 LAB — TROPONIN I (HIGH SENSITIVITY): Troponin I (High Sensitivity): 5 ng/L (ref <18)

## 2023-05-16 NOTE — ED Provider Notes (Signed)
[facility] Provider Note    Event Date/Time   First MD Initiated Contact with Patient 05/16/23 [phone]     (approximate)   History   Chest Pain   HPI Suzanne Escobar is a 77 y.o. female whose relevant medical history includes mitral valve prolapse and history of coronary artery disease with no prior history of ACS.  She presents for evaluation of feeling tired and having some chest heaviness.  She says that they had a busy day today and she felt fairly tired.  Once she was home she started to feel some heaviness in her chest and increased tiredness.  She wanted to make sure that she was okay because she lives alone so she called her daughter and had her bring her in.  The pain was mild and it has completely resolved.  She received a full dose of aspirin at home.  No shortness of breath.  No nausea or vomiting.  No fever.  She has a cardiologist nearby but not in [place] and would like to set up care locally.     Physical Exam   Triage Vital Signs: ED Triage Vitals  Encounter Vitals Group     BP 05/15/23 2318 139/78     Systolic BP Percentile --      Diastolic BP Percentile --      Pulse Rate 05/15/23 2318 91     Resp 05/15/23 2318 18     Temp 05/15/23 2318 98.2 F (36.8 C)     Temp Source 05/15/23 2318 Oral     SpO2 05/15/23 2318 100 %     Weight 05/15/23 2317 86.6 kg (191 lb 0.1 oz)     Height 05/15/23 2317 1.626 m (5\' 4" )     Head Circumference --      Peak Flow --      Pain Score 05/15/23 2316 2     Pain Loc --      Pain Education --      Exclude from Growth Chart --     Most recent vital signs: Vitals:   05/15/23 2318 05/16/23 0215  BP: 139/78 130/80  Pulse: 91 84  Resp: 18 19  Temp: 98.2 F (36.8 C)   SpO2: 100% 95%    General: Awake, no distress.  Well-appearing, alert and oriented and communicative. CV:  Good peripheral perfusion.  Normal heart sounds, regular rate and rhythm. Resp:  Normal effort. Speaking easily and  comfortably, no accessory muscle usage nor intercostal retractions.  Lungs are clear to auscultation. Abd:  No distention.    ED Results / Procedures / Treatments   Labs (all labs ordered are listed, but only abnormal results are displayed) Labs Reviewed  COMPREHENSIVE METABOLIC PANEL - Abnormal; Notable for the following components:      Result Value   Glucose, Bld 107 (*)    Calcium 8.6 (*)    All other components within normal limits  CBC WITH DIFFERENTIAL/PLATELET  TROPONIN I (HIGH SENSITIVITY)     EKG  ED ECG REPORT I, Loleta Rose, the attending physician, personally viewed and interpreted this ECG.  Date: 05/15/2023 EKG Time: 23: 16 Rate: 91 Rhythm: normal sinus rhythm QRS Axis: normal Intervals: normal ST/T Wave abnormalities: normal Narrative Interpretation: no evidence of acute ischemia    RADIOLOGY I viewed and interpreted the patient's two-view chest x-ray and I see no evidence of pneumonia, pneumothorax, nor other acute abnormality.  I also read the radiologist's report, which confirmed no acute findings.  PROCEDURES:  Critical Care performed: No  Procedures    IMPRESSION / MDM / ASSESSMENT AND PLAN / ED COURSE  I reviewed the triage vital signs and the nursing notes.                              Differential diagnosis includes, but is not limited to, anxiety, ACS, angina, pneumonia, pneumothorax, PE.  Patient's presentation is most consistent with acute presentation with potential threat to life or bodily function.  Labs/studies ordered: 2 view chest x-ray, EKG, CBC with differential, CMP, high-sensitivity troponin I was going to  Interventions/Medications given:  Medications - No data to display  (Note:  hospital course my include additional interventions and/or labs/studies not listed above.)   Patient's vital signs are stable and within normal limits.  Patient is well-appearing and in no distress and has been pain-free for several  hours.  The onset of her symptoms was more than 2 hours from the first troponin and it was completely normal.  The rest of her labs are reassuring and her EKG is reassuring as well with no evidence of ischemia.  I think this is unlikely to represent ACS, PE, or any other emergent cause of chest heaviness/discomfort.  I talked with the patient about staying for a second troponin and I even considered hospitalization given her age and history, but she very much would prefer to go home and I think that is reasonable given the otherwise reassuring workup.  Patient is stable for discharge and the patient and her daughter are comfortable with the plan.  I am referring her to local cardiology.        FINAL CLINICAL IMPRESSION(S) / ED DIAGNOSES   Final diagnoses:  Chest heaviness  Mitral valve prolapse     Rx / DC Orders   ED Discharge Orders          Ordered    Ambulatory referral to Cardiology       Comments: If you have not heard from the Cardiology office within the next 72 hours please call [phone].   05/16/23 0212             Note:  This document was prepared using Dragon voice recognition software and may include unintentional dictation errors.   Loleta Rose, MD 05/16/23 [phone]

## 2023-05-16 NOTE — Discharge Instructions (Signed)
You have been seen in the Emergency Department (ED) today for chest pain.  As we have discussed today's test results are normal, but you may require further testing.  Please follow up with the recommended doctor as instructed above in these documents regarding today's emergent visit and your recent symptoms to discuss further management.  Continue to take your regular medications.  Of note, we placed a referral to the [facility] cardiology community and they will reach out to you to ask you if you would like to set up a follow-up appointment in [place] with a local cardiologist that should work well for you.  Return to the Emergency Department (ED) if you experience any further chest pain/pressure/tightness, difficulty breathing, or sudden sweating, or other symptoms that concern you.

## 2023-05-16 NOTE — ED Notes (Signed)
RN to bedside to introduce self to pt. Pt is resting. Pt placed on cardiac monitor. Pt given warm blanket.

## 2023-05-16 NOTE — ED Notes (Signed)
Pt taken to the room via wheelchair and attached to the monitor; primary RN made aware of the patients arrival.

## 2023-05-21 ENCOUNTER — Encounter: Payer: Self-pay | Admitting: Cardiology

## 2023-05-21 ENCOUNTER — Ambulatory Visit: Payer: Medicare Other | Attending: Cardiology | Admitting: Cardiology

## 2023-05-21 VITALS — BP 124/72 | HR 79 | Ht 64.0 in | Wt 184.6 lb

## 2023-05-21 DIAGNOSIS — E7800 Pure hypercholesterolemia, unspecified: Secondary | ICD-10-CM | POA: Insufficient documentation

## 2023-05-21 DIAGNOSIS — R072 Precordial pain: Secondary | ICD-10-CM | POA: Diagnosis present

## 2023-05-21 DIAGNOSIS — Z8679 Personal history of other diseases of the circulatory system: Secondary | ICD-10-CM | POA: Diagnosis not present

## 2023-05-21 MED ORDER — METOPROLOL TARTRATE 100 MG PO TABS: 100.0000 mg | ORAL_TABLET | Freq: Once | ORAL | 0 refills | Status: DC

## 2023-05-21 NOTE — Patient Instructions (Signed)
Medication Instructions:   Your physician recommends that you continue on your current medications as directed. Please refer to the Current Medication list given to you today.  *If you need a refill on your cardiac medications before your next appointment, please call your pharmacy*   Lab Work:  None Ordered  If you have labs (blood work) drawn today and your tests are completely normal, you will receive your results only by: MyChart Message (if you have MyChart) OR A paper copy in the mail If you have any lab test that is abnormal or we need to change your treatment, we will call you to review the results.   Testing/Procedures:  Your physician has requested that you have an echocardiogram. Echocardiography is a painless test that uses sound waves to create images of your heart. It provides your doctor with information about the size and shape of your heart and how well your heart's chambers and valves are working. This procedure takes approximately one hour. There are no restrictions for this procedure. Please do NOT wear cologne, perfume, aftershave, or lotions (deodorant is allowed). Please arrive 15 minutes prior to your appointment time.    Your cardiac CT will be scheduled at one of the below locations:   [facility] [address] [place], [place] [address] [phone]  OR   [facility] Heart and Vascular entrance [address] [place], [place] [address] [phone]  Please follow these instructions carefully (unless otherwise directed):  An IV will be required for this test and Nitroglycerin will be given.  Hold all erectile dysfunction medications at least 3 days (72 hrs) prior to test. (Ie viagra, cialis, sildenafil, tadalafil, etc)   On the Night Before the Test: Be sure to Drink plenty of water. Do not consume any caffeinated/decaffeinated beverages or chocolate 12 hours prior to your test. Do not take any  antihistamines 12 hours prior to your test.   On the Day of the Test: Drink plenty of water until 1 hour prior to the test. Do not eat any food 1 hour prior to test. You may take your regular medications prior to the test.  Take metoprolol (Lopressor) two hours prior to test. If you take Furosemide/Hydrochlorothiazide/Spironolactone, please HOLD on the morning of the test. FEMALES- please wear underwire-free bra if available, avoid dresses & tight clothing      After the Test: Drink plenty of water. After receiving IV contrast, you may experience a mild flushed feeling. This is normal. On occasion, you may experience a mild rash up to 24 hours after the test. This is not dangerous. If this occurs, you can take Benadryl 25 mg and increase your fluid intake. If you experience trouble breathing, this can be serious. If it is severe call 911 IMMEDIATELY. If it is mild, please call our office. If you take any of these medications: Glipizide/Metformin, Avandament, Glucavance, please do not take 48 hours after completing test unless otherwise instructed.  We will call to schedule your test 2-4 weeks out understanding that some insurance companies will need an authorization prior to the service being performed.   For more information and frequently asked questions, please visit our website : [email or web address]  For non-scheduling related questions, please contact the cardiac imaging nurse navigator should you have any questions/concerns: Cardiac Imaging Nurse Navigators Direct Office Dial: [phone]   For scheduling needs, including cancellations and rescheduling, please call [place], [phone].   Follow-Up: At [facility], you and your health needs  are our priority.  As part of our continuing mission to provide you with exceptional heart care, we have created designated Provider Care Teams.  These Care Teams include your primary Cardiologist (physician) and  Advanced Practice Providers (APPs -  Physician Assistants and Nurse Practitioners) who all work together to provide you with the care you need, when you need it.  We recommend signing up for the patient portal called "MyChart".  Sign up information is provided on this After Visit Summary.  MyChart is used to connect with patients for Virtual Visits (Telemedicine).  Patients are able to view lab/test results, encounter notes, upcoming appointments, etc.  Non-urgent messages can be sent to your provider as well.   To learn more about what you can do with MyChart, go to [email or web address].    Your next appointment:   After Cardiac testing  Provider:   You may see Debbe Odea, MD or one of the following Advanced Practice Providers on your designated Care Team:   Nicolasa Ducking, NP Eula Listen, PA-C Cadence Fransico Michael, PA-C Charlsie Quest, NP

## 2023-05-21 NOTE — Progress Notes (Unsigned)
Cardiology Office Note:    Date:  05/24/2023   ID:  Suzanne Escobar, DOB 1946/07/21, MRN [id number]  PCP:  Enid Baas, MD   [facility] Providers Cardiologist:  Debbe Odea, MD     Referring MD: Loleta Rose, MD   Chief Complaint  Patient presents with   New Patient (Initial Visit)    Referred for cardiac evaluation of chest heaviness and mitral valve prolapse.  Previously seen by Dr. Lorenda Ishihara [facility] in [place].  Patient may transfer cardiac care here.      History of Present Illness:    Suzanne Escobar is a 77 y.o. female with a hx of hyperlipidemia presenting with chest pain.    States having symptoms of chest pressure a week ago, nonexertional.  Presented to the ED where workup with troponins and EKG were unrevealing.  States having similar chest pressure 1 year ago, followed up with PCP, advised she was okay.  States having ischemic workup via left heart cath years ago and was told everything was fine.  She takes metoprolol due to history of elevated heart rates.  Denies any history of hypertension.  States having a history of mitral valve prolapse.  Past Medical History:  Diagnosis Date   Anemia    Ankle fracture, left    Arthritis    Complication of anesthesia    a.) delayed emergence with routine colonoscopy; "I was given too much medicine". b.) PONV. c.) postoperative delirium; hallucinations.   Coronary artery calcification seen on CT scan    a.) Coronary CTA 11/03/2011: calcium score 0. b.) LHC 01/03/2013: normal coronary anatomy. c.) CTA 04/04/2021: mild coronary artery calcifications.   Dysrhythmia    Endometriosis    Fibromyalgia    GERD (gastroesophageal reflux disease)    Hearing impaired    Hiatal hernia    History of hiatal hernia    HLD (hyperlipidemia)    HTN (hypertension)    IBS (irritable bowel syndrome)    Mitral valve prolapse    Palpitations    Personal history of radiation  therapy    PONV (postoperative nausea and vomiting)    Pulmonary HTN (HCC) 08/21/2020   a.) TTE 08/21/2020: mild elevated PASP of 38 mmHg.   Skin cancer    SOB (shortness of breath)     Past Surgical History:  Procedure Laterality Date   ABDOMINAL HYSTERECTOMY     ANTERIOR CERVICAL DECOMP/DISCECTOMY FUSION N/A 12/03/2021   Procedure: C6-7 ANTERIOR CERVICAL DISCECTOMY AND FUSION (GLOBUS HEDRON);  Surgeon: Venetia Night, MD;  Location: [facility] ORS;  Service: Neurosurgery;  Laterality: N/A;   APPENDECTOMY     BACK SURGERY     cervical fusion and at tailbone   BLEPHAROPLASTY Bilateral    BREAST BIOPSY Left 06/08/2022   stereo calcs coil clip path pending   BREAST LUMPECTOMY     BREAST LUMPECTOMY WITH NEEDLE LOCALIZATION N/A 07/03/2022   Procedure: BREAST LUMPECTOMY WITH NEEDLE LOCALIZATION;  Surgeon: Earline Mayotte, MD;  Location: [facility] ORS;  Service: General;  Laterality: N/A;   CARDIAC CATHETERIZATION     X 3   CATARACT EXTRACTION, BILATERAL     CHOLECYSTECTOMY     COLONOSCOPY W/ POLYPECTOMY     COLONOSCOPY WITH ESOPHAGOGASTRODUODENOSCOPY (EGD)     FOOT SURGERY Bilateral    KYPHOPLASTY     LAPAROSCOPY     OOPHERECTOMY   LEFT HEART CATH AND CORONARY ANGIOGRAPHY Left 01/03/2013   Procedure: LEFT HEART CATH AND CORONARY ANGIOGRAPHY;  Location: [facility]; Surgeon: Alfonse Alpers, MD   ORIF WRIST FRACTURE Left 11/18/2020   Procedure: OPEN REDUCTION INTERNAL FIXATION OF LEFT DISTAL RADIUS FRACTURE;  Surgeon: Donato Heinz, MD;  Location: [facility] ORS;  Service: Orthopedics;  Laterality: Left;   SEPTOPLASTY     SHOULDER ARTHROSCOPY DISTAL CLAVICLE EXCISION AND OPEN ROTATOR CUFF REPAIR Bilateral    X2   TOTAL HIP ARTHROPLASTY Bilateral     Current Medications: Current Meds  Medication Sig   acetaminophen (TYLENOL) 500 MG tablet Take 1,000 mg by mouth every 6 (six) hours as needed.   amitriptyline (ELAVIL) 50 MG tablet Take 50 mg by mouth at bedtime.   cyanocobalamin (VITAMIN B12)  1000 MCG tablet Take 1,000 mcg by mouth daily.   furosemide (LASIX) 20 MG tablet Take 20 mg by mouth daily as needed for edema.   letrozole (FEMARA) 2.5 MG tablet Take 1 tablet (2.5 mg total) by mouth daily.   metoprolol succinate (TOPROL-XL) 25 MG 24 hr tablet Take 25 mg by mouth every evening.   metoprolol tartrate (LOPRESSOR) 100 MG tablet Take 1 tablet (100 mg total) by mouth once for 1 dose. TWO HOURS PRIOR TO CARDIAC CTA   RESTASIS 0.05 % ophthalmic emulsion Place 1 drop into both eyes 2 (two) times daily.   rosuvastatin (CRESTOR) 5 MG tablet Take 5 mg by mouth daily.   Semaglutide-Weight Management 0.25 MG/0.5ML SOAJ Inject 0.25 mg into the skin once a week.   Vitamin D-Vitamin K (K2 PLUS D3 PO) Take 1 tablet by mouth daily.     Allergies:   Hydrocodone-acetaminophen   Social History   Socioeconomic History   Marital status: Widowed    Spouse name: Not on file   Number of children: Not on file   Years of education: Not on file   Highest education level: Not on file  Occupational History   Not on file  Tobacco Use   Smoking status: Never   Smokeless tobacco: Never  Vaping Use   Vaping status: Never Used  Substance and Sexual Activity   Alcohol use: Yes    Comment: occasional wine   Drug use: Never   Sexual activity: Not on file  Other Topics Concern   Not on file  Social History Narrative   Not on file   Social Determinants of Health   Financial Resource Strain: Patient Declined (03/30/2023)   Received from [facility], [facility]   Overall Financial Resource Strain (CARDIA)    Difficulty of Paying Living Expenses: Patient declined  Food Insecurity: Patient Declined (03/30/2023)   Received from [facility], [facility]   Hunger Vital Sign    Worried About Running Out of Food in the Last Year: Patient declined    Ran Out of Food in the Last Year: Patient declined  Transportation Needs: No  Transportation Needs (03/30/2023)   Received from [facility], [facility]   [facility] - Transportation    In the past 12 months, has lack of transportation kept you from medical appointments or from getting medications?: No    Lack of Transportation (Non-Medical): No  Physical Activity: Inactive (10/26/2019)   Received from [facility], [facility]   Exercise Vital Sign    Days of Exercise per Week: 0 days    Minutes of Exercise per Session: 0 min  Stress: No Stress Concern Present (10/26/2019)   Received from [facility], [facility]  [employer] of Occupational Health - Occupational Stress Questionnaire    Feeling of Stress : Not at all  Social Connections: Moderately Integrated (10/26/2019)   Received from [facility], [facility]   Social Connection and Isolation Panel [NHANES]    Frequency of Communication with Friends and Family: More than three times a week    Frequency of Social Gatherings with Friends and Family: More than three times a week    Attends Religious Services: More than 4 times per year    Active Member of [employer] or Organizations: Yes    Attends Banker Meetings: More than 4 times per year    Marital Status: Widowed     Family History: The patient's family history includes Breast cancer in her cousin, maternal aunt, sister, and sister; Cervical cancer in her niece and paternal aunt; Heart attack in her brother and father; Heart disease in her father; Lymphoma in her brother; Throat cancer in her paternal aunt.  ROS:   Please see the history of present illness.     All other systems reviewed and are negative.  EKGs/Labs/Other Studies Reviewed:    The following studies were reviewed today:  EKG Interpretation Date/Time:  Friday May 21 2023 09:27:49 EDT Ventricular Rate:  79 PR Interval:  124 QRS Duration:  86 QT Interval:  382 QTC Calculation: 438 R Axis:   8  Text  Interpretation: Normal sinus rhythm Normal ECG Confirmed by Debbe Odea ([id number]) on 05/21/2023 9:30:53 AM    Recent Labs: 05/15/2023: ALT 13; BUN 15; Creatinine, Ser 0.74; Hemoglobin 12.6; Platelets 214; Potassium 4.3; Sodium 139  Recent Lipid Panel No results found for: "CHOL", "TRIG", "HDL", "CHOLHDL", "VLDL", "LDLCALC", "LDLDIRECT"  Outside lab work, lipid panel 01/2023 total cholesterol 158, triglycerides 61, LDL 76.  Risk Assessment/Calculations:          Physical Exam:    VS:  BP 124/72 (BP Location: Left Arm, Patient Position: Sitting, Cuff Size: Normal)   Pulse 79   Ht 5\' 4"  (1.626 m)   Wt 184 lb 9.6 oz (83.7 kg)   SpO2 98%   BMI 31.69 kg/m     Wt Readings from Last 3 Encounters:  05/21/23 184 lb 9.6 oz (83.7 kg)  05/15/23 191 lb 0.1 oz (86.6 kg)  05/03/23 191 lb (86.6 kg)     GEN:  Well nourished, well developed in no acute distress HEENT: Normal NECK: No JVD; No carotid bruits CARDIAC: RRR, no murmurs, rubs, gallops RESPIRATORY:  Clear to auscultation without rales, wheezing or rhonchi  ABDOMEN: Soft, non-tender, non-distended MUSCULOSKELETAL:  No edema; No deformity  SKIN: Warm and dry NEUROLOGIC:  Alert and oriented x 3 PSYCHIATRIC:  Normal affect   ASSESSMENT:    1. Precordial pain   2. Pure hypercholesterolemia   3. Hx of mitral valve prolapse    PLAN:    In order of problems listed above:  Chest pain, obtain echo and coronary CTA to rule out CAD.   Hyperlipidemia, cholesterol controlled, continue Crestor 5 mg daily. History of mitral valve prolapse, echo as above to evaluate any significant regurgitation.  Follow-up after echo and coronary CT.      Medication Adjustments/Labs and Tests Ordered: Current medicines are reviewed at length with the patient today.  Concerns regarding medicines are outlined above.  Orders Placed This Encounter  Procedures   CT CORONARY MORPH W/CTA COR W/SCORE W/CA W/CM &/OR WO/CM   EKG 12-Lead    ECHOCARDIOGRAM COMPLETE   Meds ordered  this encounter  Medications   metoprolol tartrate (LOPRESSOR) 100 MG tablet    Sig: Take 1 tablet (100 mg total) by mouth once for 1 dose. TWO HOURS PRIOR TO CARDIAC CTA    Dispense:  1 tablet    Refill:  0    Patient Instructions  Medication Instructions:   Your physician recommends that you continue on your current medications as directed. Please refer to the Current Medication list given to you today.  *If you need a refill on your cardiac medications before your next appointment, please call your pharmacy*   Lab Work:  None Ordered  If you have labs (blood work) drawn today and your tests are completely normal, you will receive your results only by: MyChart Message (if you have MyChart) OR A paper copy in the mail If you have any lab test that is abnormal or we need to change your treatment, we will call you to review the results.   Testing/Procedures:  Your physician has requested that you have an echocardiogram. Echocardiography is a painless test that uses sound waves to create images of your heart. It provides your doctor with information about the size and shape of your heart and how well your heart's chambers and valves are working. This procedure takes approximately one hour. There are no restrictions for this procedure. Please do NOT wear cologne, perfume, aftershave, or lotions (deodorant is allowed). Please arrive 15 minutes prior to your appointment time.    Your cardiac CT will be scheduled at one of the below locations:   [facility] [address] [place], [place] [address] [phone]  OR   [facility] Heart and Vascular entrance [address] [place], [place] [address] [phone]  Please follow these instructions carefully (unless otherwise directed):  An IV will be required for this test and Nitroglycerin will be given.  Hold all erectile dysfunction  medications at least 3 days (72 hrs) prior to test. (Ie viagra, cialis, sildenafil, tadalafil, etc)   On the Night Before the Test: Be sure to Drink plenty of water. Do not consume any caffeinated/decaffeinated beverages or chocolate 12 hours prior to your test. Do not take any antihistamines 12 hours prior to your test.   On the Day of the Test: Drink plenty of water until 1 hour prior to the test. Do not eat any food 1 hour prior to test. You may take your regular medications prior to the test.  Take metoprolol (Lopressor) two hours prior to test. If you take Furosemide/Hydrochlorothiazide/Spironolactone, please HOLD on the morning of the test. FEMALES- please wear underwire-free bra if available, avoid dresses & tight clothing      After the Test: Drink plenty of water. After receiving IV contrast, you may experience a mild flushed feeling. This is normal. On occasion, you may experience a mild rash up to 24 hours after the test. This is not dangerous. If this occurs, you can take Benadryl 25 mg and increase your fluid intake. If you experience trouble breathing, this can be serious. If it is severe call 911 IMMEDIATELY. If it is mild, please call our office. If you take any of these medications: Glipizide/Metformin, Avandament, Glucavance, please do not take 48 hours after completing test unless otherwise instructed.  We will call to schedule your test 2-4 weeks out understanding that some insurance companies will need an authorization prior to the service being performed.   For more information and frequently asked questions, please visit our  website : [email or web address]  For non-scheduling related questions, please contact the cardiac imaging nurse navigator should you have any questions/concerns: Cardiac Imaging Nurse Navigators Direct Office Dial: [phone]   For scheduling needs, including cancellations and rescheduling, please call [place],  [phone].   Follow-Up: At [facility], you and your health needs are our priority.  As part of our continuing mission to provide you with exceptional heart care, we have created designated Provider Care Teams.  These Care Teams include your primary Cardiologist (physician) and Advanced Practice Providers (APPs -  Physician Assistants and Nurse Practitioners) who all work together to provide you with the care you need, when you need it.  We recommend signing up for the patient portal called "MyChart".  Sign up information is provided on this After Visit Summary.  MyChart is used to connect with patients for Virtual Visits (Telemedicine).  Patients are able to view lab/test results, encounter notes, upcoming appointments, etc.  Non-urgent messages can be sent to your provider as well.   To learn more about what you can do with MyChart, go to [email or web address].    Your next appointment:   After Cardiac testing  Provider:   You may see Debbe Odea, MD or one of the following Advanced Practice Providers on your designated Care Team:   Nicolasa Ducking, NP Eula Listen, PA-C Cadence Fransico Michael, PA-C Charlsie Quest, NP   Signed, Debbe Odea, MD  05/24/2023 8:02 AM    [facility]

## 2023-05-24 ENCOUNTER — Other Ambulatory Visit (HOSPITAL_COMMUNITY): Payer: Self-pay | Admitting: *Deleted

## 2023-05-24 MED ORDER — METOPROLOL TARTRATE 100 MG PO TABS: 100.0000 mg | ORAL_TABLET | Freq: Once | ORAL | 0 refills | Status: DC

## 2023-05-25 ENCOUNTER — Encounter (HOSPITAL_COMMUNITY): Payer: Self-pay

## 2023-05-25 ENCOUNTER — Telehealth (HOSPITAL_COMMUNITY): Payer: Self-pay | Admitting: *Deleted

## 2023-05-25 NOTE — Telephone Encounter (Signed)
Reaching out to patient to offer assistance regarding upcoming cardiac imaging study; pt verbalizes understanding of appt date/time, parking situation and where to check in, pre-test NPO status and medications ordered, and verified current allergies; name and call back number provided for further questions should they arise Hayley Sharpe RN Navigator Cardiac Imaging [facility] [phone] office [phone] cell  

## 2023-05-26 ENCOUNTER — Other Ambulatory Visit (HOSPITAL_COMMUNITY): Payer: Self-pay | Admitting: *Deleted

## 2023-05-26 ENCOUNTER — Ambulatory Visit
Admission: RE | Admit: 2023-05-26 | Discharge: 2023-05-26 | Disposition: A | Discharge status: Home / Self Care | Payer: Medicare Other | Source: Ambulatory Visit | Attending: Cardiology | Admitting: Cardiology

## 2023-05-26 DIAGNOSIS — R072 Precordial pain: Secondary | ICD-10-CM | POA: Insufficient documentation

## 2023-05-26 MED ORDER — METOPROLOL TARTRATE 5 MG/5ML IV SOLN
10.0000 mg | Freq: Once | INTRAVENOUS | Status: AC
  Administered 2023-05-26: 5 mg via INTRAVENOUS
  Filled 2023-05-26: qty 10

## 2023-05-26 MED ORDER — IOHEXOL 350 MG/ML SOLN: 80.0000 mL | Freq: Once | INTRAVENOUS | Status: DC | PRN

## 2023-05-26 MED ORDER — NITROGLYCERIN 0.4 MG SL SUBL
0.8000 mg | SUBLINGUAL_TABLET | Freq: Once | SUBLINGUAL | Status: AC
  Administered 2023-05-26: 0.8 mg via SUBLINGUAL

## 2023-05-26 MED ORDER — METOPROLOL TARTRATE 5 MG/5ML IV SOLN
10.0000 mg | Freq: Once | INTRAVENOUS | Status: DC
  Filled 2023-05-26: qty 10

## 2023-05-26 MED ORDER — METOPROLOL TARTRATE 5 MG/5ML IV SOLN
INTRAVENOUS | Status: AC
  Filled 2023-05-26: qty 10

## 2023-05-26 MED ORDER — METOPROLOL TARTRATE 100 MG PO TABS: 100.0000 mg | ORAL_TABLET | Freq: Once | ORAL | 0 refills | Status: DC

## 2023-05-26 MED ORDER — METOPROLOL TARTRATE 5 MG/5ML IV SOLN
10.0000 mg | Freq: Once | INTRAVENOUS | Status: AC
  Administered 2023-05-26: 10 mg via INTRAVENOUS

## 2023-05-26 MED ORDER — IVABRADINE HCL 7.5 MG PO TABS: ORAL_TABLET | ORAL | 0 refills | Status: DC

## 2023-05-26 NOTE — Progress Notes (Signed)
Unable to complete scan due to pt's HR. MD Agba called and gave instructions to Wilkie Aye to inform Navigators of the changes in medications and new instructions for the pt.  Pt informed of all the new information and verbalized understanding.

## 2023-06-09 ENCOUNTER — Encounter (HOSPITAL_COMMUNITY): Payer: Self-pay

## 2023-06-09 ENCOUNTER — Telehealth (HOSPITAL_COMMUNITY): Payer: Self-pay | Admitting: Emergency Medicine

## 2023-06-09 DIAGNOSIS — R079 Chest pain, unspecified: Secondary | ICD-10-CM

## 2023-06-09 MED ORDER — IVABRADINE HCL 7.5 MG PO TABS
ORAL_TABLET | ORAL | 0 refills | Status: DC
Start: 2023-06-09 — End: 2023-08-02

## 2023-06-09 NOTE — Telephone Encounter (Signed)
Reaching out to patient to offer assistance regarding upcoming cardiac imaging study; pt verbalizes understanding of appt date/time, parking situation and where to check in, pre-test NPO status and medications ordered, and verified current allergies; name and call back number provided for further questions should they arise Sara Wallace RN Navigator Cardiac Imaging [facility] [phone] office [phone] cell 

## 2023-06-10 ENCOUNTER — Ambulatory Visit
Admission: RE | Admit: 2023-06-10 | Discharge: 2023-06-10 | Disposition: A | Discharge status: Home / Self Care | Payer: Medicare Other | Source: Ambulatory Visit | Attending: Cardiology | Admitting: Cardiology

## 2023-06-10 DIAGNOSIS — R072 Precordial pain: Secondary | ICD-10-CM | POA: Insufficient documentation

## 2023-06-10 MED ORDER — DILTIAZEM HCL 25 MG/5ML IV SOLN
10.0000 mg | INTRAVENOUS | Status: DC | PRN
  Administered 2023-06-10: 10 mg via INTRAVENOUS

## 2023-06-10 MED ORDER — NITROGLYCERIN 0.4 MG SL SUBL
0.8000 mg | SUBLINGUAL_TABLET | Freq: Once | SUBLINGUAL | Status: AC
  Administered 2023-06-10: 0.8 mg via SUBLINGUAL

## 2023-06-10 MED ORDER — IOHEXOL 350 MG/ML SOLN
100.0000 mL | Freq: Once | INTRAVENOUS | Status: AC | PRN
  Administered 2023-06-10: 100 mL via INTRAVENOUS

## 2023-06-10 MED ORDER — METOPROLOL TARTRATE 5 MG/5ML IV SOLN
10.0000 mg | Freq: Once | INTRAVENOUS | Status: AC
  Administered 2023-06-10: 10 mg via INTRAVENOUS

## 2023-06-10 MED ORDER — SODIUM CHLORIDE 0.9 % IV SOLN: INTRAVENOUS | Status: DC

## 2023-06-10 MED ORDER — METOPROLOL TARTRATE 5 MG/5ML IV SOLN
10.0000 mg | Freq: Once | INTRAVENOUS | Status: AC | PRN
  Administered 2023-06-10: 10 mg via INTRAVENOUS

## 2023-06-10 NOTE — Progress Notes (Signed)
Patient tolerated procedure well. Ambulate w/o difficulty. Denies light headedness or being dizzy. Sitting up drinking water provided. Encouraged to drink extra water today and reasoning explained. Verbalized understanding. All questions answered. ABC intact. No further needs. Discharge from procedure area w/o issues.

## 2023-06-14 ENCOUNTER — Telehealth: Payer: Self-pay | Admitting: Neurosurgery

## 2023-06-14 DIAGNOSIS — M542 Cervicalgia: Secondary | ICD-10-CM

## 2023-06-14 NOTE — Telephone Encounter (Signed)
ACDF C6-7 11/2021  Patient said that Dr.Yarbrough referred her for a cervical injection last year at 1 and 2. She had the injection on 03/17/2022 at [facility]. They told her to go back in 6 months for another injection but she never did. Since it's been over a year can she get a new referral. She has increased neck pain and knows that an injection is the only thing that will help.

## 2023-06-14 NOTE — Telephone Encounter (Signed)
I spoke with Suzanne Escobar. She reports her symptoms are like they were before the previous injection in regards to how they feel and location. Referral has been placed to [facility]. I advised her to call us back if she doesn't hear from them in the next week or 2.

## 2023-06-15 NOTE — Telephone Encounter (Signed)
Patient is aware that the order has been faxed

## 2023-06-17 ENCOUNTER — Ambulatory Visit: Payer: Medicare Other | Attending: Cardiology

## 2023-06-17 DIAGNOSIS — Z8679 Personal history of other diseases of the circulatory system: Secondary | ICD-10-CM | POA: Insufficient documentation

## 2023-06-17 DIAGNOSIS — R072 Precordial pain: Secondary | ICD-10-CM | POA: Insufficient documentation

## 2023-06-17 LAB — ECHOCARDIOGRAM COMPLETE: Area-P 1/2: 4.68 cm2

## 2023-06-18 ENCOUNTER — Telehealth: Payer: Self-pay

## 2023-06-18 NOTE — Telephone Encounter (Signed)
[id number]- Effectiveness of Out-of Pocket Cost Communication and Financial Navigation (CostCOM) in Cancer Patients:    Research nurse called patient for her one year follow up for the study listed above. Patient states she has been through a lot lately but not related to her Breast cancer. She's still having issues with her heart and her shoulder since she had surgery recently. States she is having to take Trimethoprim for her UTI's. She hopes it will help to stop them. She states she is just really fatigued and she doesn't know why, as well as losing her hair too. She's going to talk with her provider when she comes in the cancer center next month about the side effects with letrozole. Patients medications were reviewed with her in detail and her MyChart is correct per the patient. The patients address was verified for her Visa gift card to be mailed to her for study participation. She does not want to wait until 07/19/2023 at her next visit to pick it up. Patient is aware this is her last follow up for the protocol. She was thanked for her time and participation.  Chriss Driver, RN 06/18/23 4:12 PM

## 2023-07-02 ENCOUNTER — Ambulatory Visit: Payer: Medicare Other | Attending: Cardiology | Admitting: Cardiology

## 2023-07-02 ENCOUNTER — Encounter: Payer: Self-pay | Admitting: Cardiology

## 2023-07-02 VITALS — BP 110/74 | HR 80 | Ht 64.0 in | Wt 178.0 lb

## 2023-07-02 DIAGNOSIS — E7800 Pure hypercholesterolemia, unspecified: Secondary | ICD-10-CM | POA: Insufficient documentation

## 2023-07-02 DIAGNOSIS — R079 Chest pain, unspecified: Secondary | ICD-10-CM | POA: Diagnosis present

## 2023-07-02 NOTE — Progress Notes (Signed)
Cardiology Office Note:    Date:  07/02/2023   ID:  Suzanne Escobar, DOB Nov 04, 1945, MRN [id number]  PCP:  Enid Baas, MD   [facility] Providers Cardiologist:  Debbe Odea, MD     Referring MD: Enid Baas, MD   Chief Complaint  Patient presents with   Follow-up    Discuss cardiac testing results.  Patient denies new or acute cardiac problems/concerns today.      History of Present Illness:    Suzanne Escobar is a 77 y.o. female with a hx of hyperlipidemia presenting for follow-up.  Patient last seen due to symptoms of chest pain.    Echocardiogram and coronary CTA was obtained to evaluate cardiac etiology.  She presents for testing results.  Overall she feels well, symptoms have largely resolved.  States feeling occasionally fatigued, recently started on Ozempic to help with weight loss.  Past Medical History:  Diagnosis Date   Anemia    Ankle fracture, left    Arthritis    Complication of anesthesia    a.) delayed emergence with routine colonoscopy; "I was given too much medicine". b.) PONV. c.) postoperative delirium; hallucinations.   Coronary artery calcification seen on CT scan    a.) Coronary CTA 11/03/2011: calcium score 0. b.) LHC 01/03/2013: normal coronary anatomy. c.) CTA 04/04/2021: mild coronary artery calcifications.   Dysrhythmia    Endometriosis    Fibromyalgia    GERD (gastroesophageal reflux disease)    Hearing impaired    Hiatal hernia    History of hiatal hernia    HLD (hyperlipidemia)    HTN (hypertension)    IBS (irritable bowel syndrome)    Mitral valve prolapse    Palpitations    Personal history of radiation therapy    PONV (postoperative nausea and vomiting)    Pulmonary HTN (HCC) 08/21/2020   a.) TTE 08/21/2020: mild elevated PASP of 38 mmHg.   Skin cancer    SOB (shortness of breath)     Past Surgical History:  Procedure Laterality Date   ABDOMINAL HYSTERECTOMY     ANTERIOR CERVICAL  DECOMP/DISCECTOMY FUSION N/A 12/03/2021   Procedure: C6-7 ANTERIOR CERVICAL DISCECTOMY AND FUSION (GLOBUS HEDRON);  Surgeon: Venetia Night, MD;  Location: [facility];  Service: Neurosurgery;  Laterality: N/A;   APPENDECTOMY     BACK SURGERY     cervical fusion and at tailbone   BLEPHAROPLASTY Bilateral    BREAST BIOPSY Left 06/08/2022   stereo calcs coil clip path pending   BREAST LUMPECTOMY     BREAST LUMPECTOMY WITH NEEDLE LOCALIZATION N/A 07/03/2022   Procedure: BREAST LUMPECTOMY WITH NEEDLE LOCALIZATION;  Surgeon: Earline Mayotte, MD;  Location: [facility];  Service: General;  Laterality: N/A;   CARDIAC CATHETERIZATION     X 3   CATARACT EXTRACTION, BILATERAL     CHOLECYSTECTOMY     COLONOSCOPY W/ POLYPECTOMY     COLONOSCOPY WITH ESOPHAGOGASTRODUODENOSCOPY (EGD)     FOOT SURGERY Bilateral    KYPHOPLASTY     LAPAROSCOPY     OOPHERECTOMY   LEFT HEART CATH AND CORONARY ANGIOGRAPHY Left 01/03/2013   Procedure: LEFT HEART CATH AND CORONARY ANGIOGRAPHY; Location: [facility]; Surgeon: Alfonse Alpers, MD   ORIF WRIST FRACTURE Left 11/18/2020   Procedure: OPEN REDUCTION INTERNAL FIXATION OF LEFT DISTAL RADIUS FRACTURE;  Surgeon: Donato Heinz, MD;  Location: [facility];  Service: Orthopedics;  Laterality: Left;   SEPTOPLASTY     SHOULDER ARTHROSCOPY DISTAL CLAVICLE EXCISION AND OPEN ROTATOR CUFF REPAIR Bilateral  X2   TOTAL HIP ARTHROPLASTY Bilateral     Current Medications: Current Meds  Medication Sig   acetaminophen (TYLENOL) 500 MG tablet Take 1,000 mg by mouth every 6 (six) hours as needed.   amitriptyline (ELAVIL) 50 MG tablet Take 50 mg by mouth at bedtime.   cyanocobalamin (VITAMIN B12) 1000 MCG tablet Take 1,000 mcg by mouth daily.   furosemide (LASIX) 20 MG tablet Take 20 mg by mouth daily as needed for edema.   ivabradine (CORLANOR) 7.5 MG TABS tablet Take tablets (15mg ) TWO hours prior to your cardiac CT scan.   letrozole (FEMARA) 2.5 MG tablet Take 1 tablet (2.5 mg  total) by mouth daily.   magnesium oxide (MAG-OX) 400 MG tablet Take 400 mg by mouth daily.   metoprolol succinate (TOPROL-XL) 25 MG 24 hr tablet Take 25 mg by mouth every evening.   RESTASIS 0.05 % ophthalmic emulsion Place 1 drop into both eyes 2 (two) times daily.   rosuvastatin (CRESTOR) 5 MG tablet Take 5 mg by mouth daily.   Semaglutide-Weight Management 0.25 MG/0.5ML SOAJ Inject 0.25 mg into the skin once a week.   trimethoprim (TRIMPEX) 100 MG tablet Take 1 tablet (100 mg total) by mouth daily.   Vitamin D-Vitamin K (K2 PLUS D3 PO) Take 1 tablet by mouth daily.     Allergies:   Hydrocodone-acetaminophen   Social History   Socioeconomic History   Marital status: Widowed    Spouse name: Not on file   Number of children: Not on file   Years of education: Not on file   Highest education level: Not on file  Occupational History   Not on file  Tobacco Use   Smoking status: Never   Smokeless tobacco: Never  Vaping Use   Vaping status: Never Used  Substance and Sexual Activity   Alcohol use: Yes    Comment: occasional wine   Drug use: Never   Sexual activity: Not on file  Other Topics Concern   Not on file  Social History Narrative   Not on file   Social Determinants of Health   Financial Resource Strain: Patient Declined (03/30/2023)   Received from [facility], [facility]   Overall Financial Resource Strain (CARDIA)    Difficulty of Paying Living Expenses: Patient declined  Food Insecurity: Patient Declined (03/30/2023)   Received from [facility], [facility]   Hunger Vital Sign    Worried About Running Out of Food in the Last Year: Patient declined    Ran Out of Food in the Last Year: Patient declined  Transportation Needs: No Transportation Needs (03/30/2023)   Received from [facility], [facility]   [facility] - Transportation    In the past 12 months, has lack  of transportation kept you from medical appointments or from getting medications?: No    Lack of Transportation (Non-Medical): No  Physical Activity: Inactive (10/26/2019)   Received from [facility], [facility]   Exercise Vital Sign    Days of Exercise per Week: 0 days    Minutes of Exercise per Session: 0 min  Stress: No Stress Concern Present (10/26/2019)   Received from [facility], [facility] - Occupational Stress Questionnaire    Feeling of Stress : Not at all  Social Connections: Moderately Integrated (10/26/2019)   Received from [facility], [facility]   Social  Connection and Isolation Panel [NHANES]    Frequency of Communication with Friends and Family: More than three times a week    Frequency of Social Gatherings with Friends and Family: More than three times a week    Attends Religious Services: More than 4 times per year    Active Member of [employer] or Organizations: Yes    Attends Banker Meetings: More than 4 times per year    Marital Status: Widowed     Family History: The patient's family history includes Breast cancer in her cousin, maternal aunt, sister, and sister; Cervical cancer in her niece and paternal aunt; Heart attack in her brother and father; Heart disease in her father; Lymphoma in her brother; Throat cancer in her paternal aunt.  ROS:   Please see the history of present illness.     All other systems reviewed and are negative.  EKGs/Labs/Other Studies Reviewed:    The following studies were reviewed today:       Recent Labs: 05/15/2023: ALT 13; BUN 15; Creatinine, Ser 0.74; Hemoglobin 12.6; Platelets 214; Potassium 4.3; Sodium 139  Recent Lipid Panel No results found for: "CHOL", "TRIG", "HDL", "CHOLHDL", "VLDL", "LDLCALC", "LDLDIRECT"  Outside lab work, lipid panel 01/2023 total cholesterol 158, triglycerides 61, LDL 76.  Risk Assessment/Calculations:           Physical Exam:    VS:  BP 110/74 (BP Location: Left Arm, Patient Position: Sitting, Cuff Size: Large)   Pulse 80   Ht 5\' 4"  (1.626 m)   Wt 178 lb (80.7 kg)   SpO2 94%   BMI 30.55 kg/m     Wt Readings from Last 3 Encounters:  07/02/23 178 lb (80.7 kg)  05/21/23 184 lb 9.6 oz (83.7 kg)  05/15/23 191 lb 0.1 oz (86.6 kg)     GEN:  Well nourished, well developed in no acute distress HEENT: Normal NECK: No JVD; No carotid bruits CARDIAC: RRR, no murmurs, rubs, gallops RESPIRATORY:  Clear to auscultation without rales, wheezing or rhonchi  ABDOMEN: Soft, non-tender, non-distended MUSCULOSKELETAL:  No edema; No deformity  SKIN: Warm and dry NEUROLOGIC:  Alert and oriented x 3 PSYCHIATRIC:  Normal affect   ASSESSMENT:    1. Chest pain, unspecified type   2. Pure hypercholesterolemia    PLAN:    In order of problems listed above:  Chest pain, shortness of breath coronary CT 9/24 mild proximal LAD stenosis 25%.  Calcium score 92.8.  Echo with normal EF 60 to 65%.  Continue Crestor.  Increase activity advised. Hyperlipidemia, cholesterol controlled, continue Crestor 5 mg daily.  Follow-up in 1 year or as needed.      Medication Adjustments/Labs and Tests Ordered: Current medicines are reviewed at length with the patient today.  Concerns regarding medicines are outlined above.  No orders of the defined types were placed in this encounter.  No orders of the defined types were placed in this encounter.   Patient Instructions  Medication Instructions:  Your physician recommends that you continue on your current medications as directed. Please refer to the Current Medication list given to you today.  *If you need a refill on your cardiac medications before your next appointment, please call your pharmacy*  Lab Work: -None ordered  Testing/Procedures: -None ordered  Follow-Up: At [facility] you and your health needs are our priority.  As part of our  continuing mission to provide you with exceptional heart care, we have created designated Provider Care Teams.  These  Care Teams include your primary Cardiologist (physician) and Advanced Practice Providers (APPs -  Physician Assistants and Nurse Practitioners) who all work together to provide you with the care you need, when you need it.  Your next appointment:   1 year(s)  Provider:   You may see Debbe Odea, MD or one of the following Advanced Practice Providers on your designated Care Team:   Nicolasa Ducking, NP Eula Listen, PA-C Cadence Fransico Michael, PA-C Charlsie Quest, NP    Other Instructions -None    Signed, Debbe Odea, MD  07/02/2023 11:59 AM    [facility]

## 2023-07-02 NOTE — Patient Instructions (Signed)
Medication Instructions:  Your physician recommends that you continue on your current medications as directed. Please refer to the Current Medication list given to you today.  *If you need a refill on your cardiac medications before your next appointment, please call your pharmacy*  Lab Work: -None ordered  Testing/Procedures: -None ordered  Follow-Up: At [facility], you and your health needs are our priority.  As part of our continuing mission to provide you with exceptional heart care, we have created designated Provider Care Teams.  These Care Teams include your primary Cardiologist (physician) and Advanced Practice Providers (APPs -  Physician Assistants and Nurse Practitioners) who all work together to provide you with the care you need, when you need it.  Your next appointment:   1 year(s)  Provider:   You may see Debbe Odea, MD or one of the following Advanced Practice Providers on your designated Care Team:   Nicolasa Ducking, NP Eula Listen, PA-C Cadence Fransico Michael, PA-C Charlsie Quest, NP    Other Instructions -None

## 2023-07-16 ENCOUNTER — Other Ambulatory Visit: Payer: Self-pay

## 2023-07-16 DIAGNOSIS — D0512 Intraductal carcinoma in situ of left breast: Secondary | ICD-10-CM

## 2023-07-19 ENCOUNTER — Inpatient Hospital Stay (HOSPITAL_BASED_OUTPATIENT_CLINIC_OR_DEPARTMENT_OTHER): Payer: Medicare Other | Admitting: Oncology

## 2023-07-19 ENCOUNTER — Encounter: Payer: Self-pay | Admitting: Oncology

## 2023-07-19 ENCOUNTER — Inpatient Hospital Stay: Payer: Medicare Other | Attending: Oncology

## 2023-07-19 VITALS — BP 137/72 | HR 74 | Temp 97.8°F | Resp 17 | Wt 181.0 lb

## 2023-07-19 DIAGNOSIS — M8580 Other specified disorders of bone density and structure, unspecified site: Secondary | ICD-10-CM | POA: Insufficient documentation

## 2023-07-19 DIAGNOSIS — Z170 Estrogen receptor positive status [ER+]: Secondary | ICD-10-CM | POA: Diagnosis not present

## 2023-07-19 DIAGNOSIS — Z86000 Personal history of in-situ neoplasm of breast: Secondary | ICD-10-CM | POA: Diagnosis not present

## 2023-07-19 DIAGNOSIS — Z90710 Acquired absence of both cervix and uterus: Secondary | ICD-10-CM | POA: Insufficient documentation

## 2023-07-19 DIAGNOSIS — Z79811 Long term (current) use of aromatase inhibitors: Secondary | ICD-10-CM | POA: Diagnosis not present

## 2023-07-19 DIAGNOSIS — Z08 Encounter for follow-up examination after completed treatment for malignant neoplasm: Secondary | ICD-10-CM | POA: Diagnosis not present

## 2023-07-19 DIAGNOSIS — Z8049 Family history of malignant neoplasm of other genital organs: Secondary | ICD-10-CM | POA: Diagnosis not present

## 2023-07-19 DIAGNOSIS — Z807 Family history of other malignant neoplasms of lymphoid, hematopoietic and related tissues: Secondary | ICD-10-CM | POA: Diagnosis not present

## 2023-07-19 DIAGNOSIS — D0512 Intraductal carcinoma in situ of left breast: Secondary | ICD-10-CM | POA: Insufficient documentation

## 2023-07-19 DIAGNOSIS — Z800 Family history of malignant neoplasm of digestive organs: Secondary | ICD-10-CM | POA: Diagnosis not present

## 2023-07-19 DIAGNOSIS — Z803 Family history of malignant neoplasm of breast: Secondary | ICD-10-CM | POA: Diagnosis not present

## 2023-07-19 LAB — CBC WITH DIFFERENTIAL (CANCER CENTER ONLY)
Abs Immature Granulocytes: 0.01 10*3/uL (ref 0.00–0.07)
Basophils Absolute: 0.1 10*3/uL (ref 0.0–0.1)
Basophils Relative: 1 %
Eosinophils Absolute: 0.1 10*3/uL (ref 0.0–0.5)
Eosinophils Relative: 1 %
HCT: 38.6 % (ref 36.0–46.0)
Hemoglobin: 12.5 g/dL (ref 12.0–15.0)
Immature Granulocytes: 0 %
Lymphocytes Relative: 31 %
Lymphs Abs: 1.9 10*3/uL (ref 0.7–4.0)
MCH: 30.9 pg (ref 26.0–34.0)
MCHC: 32.4 g/dL (ref 30.0–36.0)
MCV: 95.3 fL (ref 80.0–100.0)
Monocytes Absolute: 0.5 10*3/uL (ref 0.1–1.0)
Monocytes Relative: 8 %
Neutro Abs: 3.7 10*3/uL (ref 1.7–7.7)
Neutrophils Relative %: 59 %
Platelet Count: 250 10*3/uL (ref 150–400)
RBC: 4.05 MIL/uL (ref 3.87–5.11)
RDW: 12.7 % (ref 11.5–15.5)
WBC Count: 6.2 10*3/uL (ref 4.0–10.5)
nRBC: 0 % (ref 0.0–0.2)

## 2023-07-19 LAB — CMP (CANCER CENTER ONLY)
ALT: 14 U/L (ref 0–44)
AST: 16 U/L (ref 15–41)
Albumin: 3.6 g/dL (ref 3.5–5.0)
Alkaline Phosphatase: 59 U/L (ref 38–126)
Anion gap: 8 (ref 5–15)
BUN: 19 mg/dL (ref 8–23)
CO2: 26 mmol/L (ref 22–32)
Calcium: 8.6 mg/dL — ABNORMAL LOW (ref 8.9–10.3)
Chloride: 105 mmol/L (ref 98–111)
Creatinine: 0.62 mg/dL (ref 0.44–1.00)
GFR, Estimated: >60 mL/min (ref >60)
Glucose, Bld: 104 mg/dL — ABNORMAL HIGH (ref 70–99)
Potassium: 4.3 mmol/L (ref 3.5–5.1)
Sodium: 139 mmol/L (ref 135–145)
Total Bilirubin: 0.4 mg/dL (ref 0.3–1.2)
Total Protein: 6.9 g/dL (ref 6.5–8.1)

## 2023-07-19 NOTE — Progress Notes (Signed)
Hematology/Oncology Consult note [facility]  [phone] [phone]  Patient Care Team: Enid Baas, MD as PCP - General (Internal Medicine) Debbe Odea, MD as PCP - Cardiology (Cardiology) Hulen Luster, RN as Oncology Nurse Navigator   Name of the patient: Suzanne Escobar  [id number]  04-16-1946   Date of visit: 07/19/23  Diagnosis-  left breast DCIS ER positive   Chief complaint/ Reason for visit-routine follow-up of left breast DCIS  Heme/Onc history: patient is a 77 year old female who underwent a routine screening marrow gram in July 2023 which showed suspicious calcifications in her left breast.  This was followed by diagnostic mammogram which showed 10 mm group of calcifications in the upper outer quadrant of the left breast.  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  Patient has met with Dr. Lemar Livings and will be undergoing lumpectomy at some point next month.  She is otherwise doing well for her age.  There is a family history of breast cancer in 2 of her sisters.  Menarche at the age of 58.  She is G3 P3.  Age at first birth 70.  She is not presently on hormone replacement therapy.  No prior history of breast biopsies.   Final pathology showed 16 mm grade 2 DCIS with close   Inferior-anterior margin of 0.5 mm. No evidence of invasive malignancy.  Patient completed adjuvant radiation therapy.  She has not started endocrine therapy yet baseline bone density scan showed osteopenia    Interval history-patient is tolerating letrozole well but she does report occasional pain in her bilateral knees.  She is also concerned about ongoing hair loss and sees dermatology and will be undergoing PRP treatment for it soon.  ECOG PS- 1 Pain scale- 0   Review of systems- Review of Systems  Constitutional:  Positive for malaise/fatigue. Negative for chills, fever and weight loss.  HENT:  Negative for congestion, ear discharge and  nosebleeds.   Eyes:  Negative for blurred vision.  Respiratory:  Negative for cough, hemoptysis, sputum production, shortness of breath and wheezing.   Cardiovascular:  Negative for chest pain, palpitations, orthopnea and claudication.  Gastrointestinal:  Negative for abdominal pain, blood in stool, constipation, diarrhea, heartburn, melena, nausea and vomiting.  Genitourinary:  Negative for dysuria, flank pain, frequency, hematuria and urgency.  Musculoskeletal:  Positive for joint pain. Negative for back pain and myalgias.  Skin:  Negative for rash.  Neurological:  Negative for dizziness, tingling, focal weakness, seizures, weakness and headaches.  Endo/Heme/Allergies:  Does not bruise/bleed easily.  Psychiatric/Behavioral:  Negative for depression and suicidal ideas. The patient does not have insomnia.       Allergies  Allergen Reactions   Hydrocodone-Acetaminophen Itching    Can take if she takes benadryl with it.       Past Medical History:  Diagnosis Date   Anemia    Ankle fracture, left    Arthritis    Complication of anesthesia    a.) delayed emergence with routine colonoscopy; "I was given too much medicine". b.) PONV. c.) postoperative delirium; hallucinations.   Coronary artery calcification seen on CT scan    a.) Coronary CTA 11/03/2011: calcium score 0. b.) LHC 01/03/2013: normal coronary anatomy. c.) CTA 04/04/2021: mild coronary artery calcifications.   Dysrhythmia    Endometriosis    Fibromyalgia    GERD (gastroesophageal reflux disease)    Hearing impaired    Hiatal hernia    History of hiatal hernia    HLD (  hyperlipidemia)    HTN (hypertension)    IBS (irritable bowel syndrome)    Mitral valve prolapse    Palpitations    Personal history of radiation therapy    PONV (postoperative nausea and vomiting)    Pulmonary HTN (HCC) 08/21/2020   a.) TTE 08/21/2020: mild elevated PASP of 38 mmHg.   Skin cancer    SOB (shortness of breath)      Past  Surgical History:  Procedure Laterality Date   ABDOMINAL HYSTERECTOMY     ANTERIOR CERVICAL DECOMP/DISCECTOMY FUSION N/A 12/03/2021   Procedure: C6-7 ANTERIOR CERVICAL DISCECTOMY AND FUSION (GLOBUS HEDRON);  Surgeon: Venetia Night, MD;  Location: [facility] ORS;  Service: Neurosurgery;  Laterality: N/A;   APPENDECTOMY     BACK SURGERY     cervical fusion and at tailbone   BLEPHAROPLASTY Bilateral    BREAST BIOPSY Left 06/08/2022   stereo calcs coil clip path pending   BREAST LUMPECTOMY     BREAST LUMPECTOMY WITH NEEDLE LOCALIZATION N/A 07/03/2022   Procedure: BREAST LUMPECTOMY WITH NEEDLE LOCALIZATION;  Surgeon: Earline Mayotte, MD;  Location: [facility] ORS;  Service: General;  Laterality: N/A;   CARDIAC CATHETERIZATION     X 3   CATARACT EXTRACTION, BILATERAL     CHOLECYSTECTOMY     COLONOSCOPY W/ POLYPECTOMY     COLONOSCOPY WITH ESOPHAGOGASTRODUODENOSCOPY (EGD)     FOOT SURGERY Bilateral    KYPHOPLASTY     LAPAROSCOPY     OOPHERECTOMY   LEFT HEART CATH AND CORONARY ANGIOGRAPHY Left 01/03/2013   Procedure: LEFT HEART CATH AND CORONARY ANGIOGRAPHY; Location: [facility]; Surgeon: Alfonse Alpers, MD   ORIF WRIST FRACTURE Left 11/18/2020   Procedure: OPEN REDUCTION INTERNAL FIXATION OF LEFT DISTAL RADIUS FRACTURE;  Surgeon: Donato Heinz, MD;  Location: [facility] OR[facility];  Service: Orthopedics;  Laterality: Left;   SEPTOPLASTY     SHOULDER ARTHROSCOPY DISTAL CLAVICLE EXCISION AND OPEN ROTATOR CUFF REPAIR Bilateral    X2   TOTAL HIP ARTHROPLASTY Bilateral     Social History   Socioeconomic History   Marital status: Widowed    Spouse name: Not on file   Number of children: Not on file   Years of education: Not on file   Highest education level: Not on file  Occupational History   Not on file  Tobacco Use   Smoking status: Never   Smokeless tobacco: Never  Vaping Use   Vaping status: Never Used  Substance and Sexual Activity   Alcohol use: Yes    Comment: occasional wine   Drug use:  Never   Sexual activity: Not on file  Other Topics Concern   Not on file  Social History Narrative   Not on file   Social Determinants of Health   Financial Resource Strain: Low Risk  (07/14/2023)   Received from [facility]   Overall Financial Resource Strain (CARDIA)    Difficulty of Paying Living Expenses: Not hard at all  Food Insecurity: No Food Insecurity (07/14/2023)   Received from [facility]   Hunger Vital Sign    Worried About Running Out of Food in the Last Year: Never true    Ran Out of Food in the Last Year: Never true  Transportation Needs: No Transportation Needs (07/14/2023)   Received from [facility] - Transportation    In the past 12 months, has lack of transportation kept you from medical appointments or from getting medications?: No  Lack of Transportation (Non-Medical): No  Physical Activity: Inactive (10/26/2019)   Received from [facility], [facility]   Exercise Vital Sign    Days of Exercise per Week: 0 days    Minutes of Exercise per Session: 0 min  Stress: No Stress Concern Present (10/26/2019)   Received from [facility] - Occupational Stress Questionnaire    Feeling of Stress : Not at all  Social Connections: Moderately Integrated (10/26/2019)   Received from [facility], [facility]   Social Connection and Isolation Panel [NHANES]    Frequency of Communication with Friends and Family: More than three times a week    Frequency of Social Gatherings with Friends and Family: More than three times a week    Attends Religious Services: More than 4 times per year    Active Member of [employer] or Organizations: Yes    Attends Banker Meetings: More than 4 times per year    Marital Status: Widowed  Intimate Partner Violence: Not At Risk (10/26/2019)   Received from [facility], [facility]    Humiliation, Afraid, Rape, and Kick questionnaire    Fear of Current or Ex-Partner: No    Emotionally Abused: No    Physically Abused: No    Sexually Abused: No    Family History  Problem Relation Age of Onset   Heart attack Father    Heart disease Father    Breast cancer Sister        d. 34s; metastatic   Breast cancer Sister        dx 28   Heart attack Brother    Lymphoma Brother    Breast cancer Maternal Aunt        dx twice   Throat cancer Paternal Aunt    Cervical cancer Paternal Aunt    Breast cancer Cousin        maternal cousin   Cervical cancer Niece      Current Outpatient Medications:    acetaminophen (TYLENOL) 500 MG tablet, Take 1,000 mg by mouth every 6 (six) hours as needed., Disp: , Rfl:    amitriptyline (ELAVIL) 50 MG tablet, Take 50 mg by mouth at bedtime., Disp: , Rfl:    cyanocobalamin (VITAMIN B12) 1000 MCG tablet, Take 1,000 mcg by mouth daily., Disp: , Rfl:    furosemide (LASIX) 20 MG tablet, Take 20 mg by mouth daily as needed for edema., Disp: , Rfl:    letrozole (FEMARA) 2.5 MG tablet, Take 1 tablet (2.5 mg total) by mouth daily., Disp: 30 tablet, Rfl: 3   magnesium oxide (MAG-OX) 400 MG tablet, Take 400 mg by mouth daily., Disp: , Rfl:    methylPREDNISolone (MEDROL DOSEPAK) 4 MG TBPK tablet, See admin instructions., Disp: , Rfl:    metoprolol succinate (TOPROL-XL) 25 MG 24 hr tablet, Take 25 mg by mouth every evening., Disp: , Rfl:    RESTASIS 0.05 % ophthalmic emulsion, Place 1 drop into both eyes 2 (two) times daily., Disp: , Rfl:    rosuvastatin (CRESTOR) 5 MG tablet, Take 5 mg by mouth daily., Disp: , Rfl:    Semaglutide-Weight Management 0.25 MG/0.5ML SOAJ, Inject 0.25 mg into the skin once a week., Disp: , Rfl:    Vitamin D-Vitamin K (K2 PLUS D3 PO), Take 1 tablet by mouth daily., Disp: , Rfl:    doxycycline (VIBRAMYCIN) 100 MG capsule, Take by mouth., Disp: ,  Rfl:    ivabradine (CORLANOR) 7.5 MG TABS tablet, Take tablets (15mg ) TWO hours  prior to your cardiac CT scan. (Patient not taking: Reported on 07/19/2023), Disp: 2 tablet, Rfl: 0   trimethoprim (TRIMPEX) 100 MG tablet, Take 1 tablet (100 mg total) by mouth daily. (Patient not taking: Reported on 07/19/2023), Disp: 30 tablet, Rfl: 11  Physical exam:  Vitals:   07/19/23 1146  BP: 137/72  Pulse: 74  Resp: 17  Temp: 97.8 F (36.6 C)  TempSrc: Tympanic  SpO2: 99%  Weight: 181 lb (82.1 kg)   Physical Exam Cardiovascular:     Rate and Rhythm: Normal rate and regular rhythm.     Heart sounds: Normal heart sounds.  Pulmonary:     Effort: Pulmonary effort is normal.     Breath sounds: Normal breath sounds.  Abdominal:     General: Bowel sounds are normal.     Palpations: Abdomen is soft.  Skin:    General: Skin is warm and dry.  Neurological:     Mental Status: She is alert and oriented to person, place, and time.    Breast exam was performed in seated and lying down position. Patient is status post left lumpectomy with a well-healed surgical scar. No evidence of any palpable masses. No evidence of axillary adenopathy. No evidence of any palpable masses or lumps in the right breast. No evidence of right axillary adenopathy To     Latest Ref Rng & Units 07/19/2023   11:22 AM  CMP  Glucose 70 - 99 mg/dL 782   BUN 8 - 23 mg/dL 19   Creatinine 9.56 - 1.00 mg/dL 2.13   Sodium 086 - 578 mmol/L 139   Potassium 3.5 - 5.1 mmol/L 4.3   Chloride 98 - 111 mmol/L 105   CO2 22 - 32 mmol/L 26   Calcium 8.9 - 10.3 mg/dL 8.6   Total Protein 6.5 - 8.1 g/dL 6.9   Total Bilirubin 0.3 - 1.2 mg/dL 0.4   Alkaline Phos 38 - 126 U/L 59   AST 15 - 41 U/L 16   ALT 0 - 44 U/L 14       Latest Ref Rng & Units 07/19/2023   11:22 AM  CBC  WBC 4.0 - 10.5 K/uL 6.2   Hemoglobin 12.0 - 15.0 g/dL 46.9   Hematocrit 62.9 - 46.0 % 38.6   Platelets 150 - 400 K/uL 250      Assessment and plan- Patient is a 77 y.o. female with history of left breast DCIS ER positive status  postlumpectomy and adjuvant radiation treatment.  She is here for a routine follow-up  Patient is concerned about continued use of letrozole in the setting of osteopenia as well as ongoing joint pain.  She is going to take a break from letrozole for the next 3 weeks and see if her joint pains improved.  If they do not then they are unlikely related to letrozole.  We also discussed the alternative of trying tamoxifen.  Tamoxifen carries a mildly increased risk of DVT.  She has already undergone cataract surgery and hysterectomy and therefore would not be at an increased risk of those side effects.  Patient will restart letrozole if her joint pains are no better after stopping it and we will consider getting a bone density scan next year.  I will See her back in 6 months no labs   Visit Diagnosis 1. Encounter for follow-up surveillance of ductal carcinoma in situ (DCIS) of breast  2. Use of letrozole (Femara)      Dr. Owens Shark, MD, MPH [facility] [phone] 07/19/2023 4:17 PM

## 2023-07-19 NOTE — Progress Notes (Signed)
Patient here for oncology follow-up appointment, concerns of hair loss

## 2023-08-02 ENCOUNTER — Encounter: Payer: Self-pay | Admitting: Urology

## 2023-08-02 ENCOUNTER — Ambulatory Visit: Payer: Medicare Other | Admitting: Urology

## 2023-08-02 VITALS — Ht 64.0 in | Wt 181.0 lb

## 2023-08-02 DIAGNOSIS — N390 Urinary tract infection, site not specified: Secondary | ICD-10-CM

## 2023-08-02 DIAGNOSIS — Z09 Encounter for follow-up examination after completed treatment for conditions other than malignant neoplasm: Secondary | ICD-10-CM

## 2023-08-02 DIAGNOSIS — Z87440 Personal history of urinary (tract) infections: Secondary | ICD-10-CM | POA: Diagnosis not present

## 2023-08-02 LAB — MICROSCOPIC EXAMINATION

## 2023-08-02 LAB — URINALYSIS, COMPLETE
Bilirubin, UA: NEGATIVE
Glucose, UA: NEGATIVE
Ketones, UA: NEGATIVE
Leukocytes,UA: NEGATIVE
Nitrite, UA: NEGATIVE
Protein,UA: NEGATIVE
Specific Gravity, UA: 1.015 (ref 1.005–1.030)
Urobilinogen, Ur: 0.2 mg/dL (ref 0.2–1.0)
pH, UA: 6 (ref 5.0–7.5)

## 2023-08-02 NOTE — Progress Notes (Signed)
08/02/2023 10:17 AM   Suzanne Escobar 02-06-1946 [id number]  Referring provider: Enid Baas, MD [address] [place],  [place] [address]  Chief Complaint  Patient presents with   Follow-up    follow-up    HPI:  was consulted to assess the patient's urinary tract infections.  She was a bit nonspecific but she says she has been treated 4 times this year.  She gets burning and frequency that temporarily respond to an antibiotic.  She also has right-sided flank pain and back pain.   At baseline she is continent and voids every 2 hours gets up once a night reports a good flow   She has had 5 neck surgery and 1 low back surgery.  She has had a hysterectomy   She had a normal CT of the abdomen pelvis in June 2024.  I could not find cultures in the chart.  She does not think she is infected today   Pathophysiology of UTIs discussed. Could not get a sample today. Reassess in 6 weeks trimethoprim 100 mg 3 x 11 for pelvic examination cystoscopy. We will proceed accordingly. She understands right flank pain is not from the genitourinary cause.      Today Frequency stable.  No recent culture Clinically not infected.  Patient says she never got the trimethoprim. On pelvic examination she grade 2 hypermobility the bladder neck, small cystocele.  No stress incontinence Cystoscopy.  Patient underwent flexible cystoscopy.  Bladder mucosa and trigone were normal.  No carcinoma.  Few white flecks in urine.  No obvious cystitis   Send urine for culture. Reevaluate in 8 weeks on trimethoprim 100 mg 30 x 11   TOday Took the trimethoprim for 3 months and was infection free and very happy.  She went off and is doing well.  We talked about whether or not to stay on it or not.  Frequency stable.  Clinically not infected   PMH: Past Medical History:  Diagnosis Date   Anemia    Ankle fracture, left    Arthritis    Complication of anesthesia    a.) delayed emergence with routine  colonoscopy; "I was given too much medicine". b.) PONV. c.) postoperative delirium; hallucinations.   Coronary artery calcification seen on CT scan    a.) Coronary CTA 11/03/2011: calcium score 0. b.) LHC 01/03/2013: normal coronary anatomy. c.) CTA 04/04/2021: mild coronary artery calcifications.   Dysrhythmia    Endometriosis    Fibromyalgia    GERD (gastroesophageal reflux disease)    Hearing impaired    Hiatal hernia    History of hiatal hernia    HLD (hyperlipidemia)    HTN (hypertension)    IBS (irritable bowel syndrome)    Mitral valve prolapse    Palpitations    Personal history of radiation therapy    PONV (postoperative nausea and vomiting)    Pulmonary HTN (HCC) 08/21/2020   a.) TTE 08/21/2020: mild elevated PASP of 38 mmHg.   Skin cancer    SOB (shortness of breath)     Surgical History: Past Surgical History:  Procedure Laterality Date   ABDOMINAL HYSTERECTOMY     ANTERIOR CERVICAL DECOMP/DISCECTOMY FUSION N/A 12/03/2021   Procedure: C6-7 ANTERIOR CERVICAL DISCECTOMY AND FUSION (GLOBUS HEDRON);  Surgeon: Venetia Night, MD;  Location: [facility] ORS;  Service: Neurosurgery;  Laterality: N/A;   APPENDECTOMY     BACK SURGERY     cervical fusion and at tailbone   BLEPHAROPLASTY Bilateral    BREAST BIOPSY  Left 06/08/2022   stereo calcs coil clip path pending   BREAST LUMPECTOMY     BREAST LUMPECTOMY WITH NEEDLE LOCALIZATION N/A 07/03/2022   Procedure: BREAST LUMPECTOMY WITH NEEDLE LOCALIZATION;  Surgeon: Earline Mayotte, MD;  Location: [facility] ORS;  Service: General;  Laterality: N/A;   CARDIAC CATHETERIZATION     X 3   CATARACT EXTRACTION, BILATERAL     CHOLECYSTECTOMY     COLONOSCOPY W/ POLYPECTOMY     COLONOSCOPY WITH ESOPHAGOGASTRODUODENOSCOPY (EGD)     FOOT SURGERY Bilateral    KYPHOPLASTY     LAPAROSCOPY     OOPHERECTOMY   LEFT HEART CATH AND CORONARY ANGIOGRAPHY Left 01/03/2013   Procedure: LEFT HEART CATH AND CORONARY ANGIOGRAPHY; Location: [facility];  Surgeon: Alfonse Alpers, MD   ORIF WRIST FRACTURE Left 11/18/2020   Procedure: OPEN REDUCTION INTERNAL FIXATION OF LEFT DISTAL RADIUS FRACTURE;  Surgeon: Donato Heinz, MD;  Location: [facility] ORS;  Service: Orthopedics;  Laterality: Left;   SEPTOPLASTY     SHOULDER ARTHROSCOPY DISTAL CLAVICLE EXCISION AND OPEN ROTATOR CUFF REPAIR Bilateral    X2   TOTAL HIP ARTHROPLASTY Bilateral     Home Medications:  Allergies as of 08/02/2023       Reactions   Hydrocodone-acetaminophen Itching   Can take if she takes benadryl with it.        Medication List        Accurate as of August 02, 2023 10:17 AM. If you have any questions, ask your nurse or doctor.          STOP taking these medications    ivabradine 7.5 MG Tabs tablet Commonly known as: Corlanor Stopped by: Lorin Picket A Hassen Bruun       TAKE these medications    acetaminophen 500 MG tablet Commonly known as: TYLENOL Take 1,000 mg by mouth every 6 (six) hours as needed.   amitriptyline 50 MG tablet Commonly known as: ELAVIL Take 50 mg by mouth at bedtime.   cyanocobalamin 1000 MCG tablet Commonly known as: VITAMIN B12 Take 1,000 mcg by mouth daily.   furosemide 20 MG tablet Commonly known as: LASIX Take 20 mg by mouth daily as needed for edema.   K2 PLUS D3 PO Take 1 tablet by mouth daily.   letrozole 2.5 MG tablet Commonly known as: FEMARA Take 1 tablet (2.5 mg total) by mouth daily.   magnesium oxide 400 MG tablet Commonly known as: MAG-OX Take 400 mg by mouth daily.   metoprolol succinate 25 MG 24 hr tablet Commonly known as: TOPROL-XL Take 25 mg by mouth every evening.   Restasis 0.05 % ophthalmic emulsion Generic drug: cycloSPORINE Place 1 drop into both eyes 2 (two) times daily.   rosuvastatin 5 MG tablet Commonly known as: CRESTOR Take 5 mg by mouth daily.   Semaglutide-Weight Management 0.25 MG/0.5ML Soaj Inject 0.25 mg into the skin once a week.   trimethoprim 100 MG tablet Commonly known  as: TRIMPEX Take 1 tablet (100 mg total) by mouth daily.        Allergies:  Allergies  Allergen Reactions   Hydrocodone-Acetaminophen Itching    Can take if she takes benadryl with it.      Family History: Family History  Problem Relation Age of Onset   Heart attack Father    Heart disease Father    Breast cancer Sister        d. 52s; metastatic   Breast cancer Sister        dx 33  Heart attack Brother    Lymphoma Brother    Breast cancer Maternal Aunt        dx twice   Throat cancer Paternal Aunt    Cervical cancer Paternal Aunt    Breast cancer Cousin        maternal cousin   Cervical cancer Niece     Social History:  reports that she has never smoked. She has never been exposed to tobacco smoke. She has never used smokeless tobacco. She reports current alcohol use. She reports that she does not use drugs.  ROS:                                        Physical Exam: Ht 5\' 4"  (1.626 m)   Wt 82.1 kg   BMI 31.07 kg/m   Constitutional:  Alert and oriented, No acute distress. HEENT: [place] AT, moist mucus membranes.  Trachea midline, no masses.   Laboratory Data: Lab Results  Component Value Date   WBC 6.2 07/19/2023   HGB 12.5 07/19/2023   HCT 38.6 07/19/2023   MCV 95.3 07/19/2023   PLT 250 07/19/2023    Lab Results  Component Value Date   CREATININE 0.62 07/19/2023    No results found for: "PSA"  No results found for: "TESTOSTERONE"  No results found for: "HGBA1C"  Urinalysis    Component Value Date/Time   COLORURINE YELLOW (A) 11/19/2021 1352   APPEARANCEUR Clear 05/03/2023 1010   LABSPEC 1.010 11/19/2021 1352   PHURINE 6.0 11/19/2021 1352   GLUCOSEU Negative 05/03/2023 1010   HGBUR NEGATIVE 11/19/2021 1352   BILIRUBINUR Negative 05/03/2023 1010   KETONESUR NEGATIVE 11/19/2021 1352   PROTEINUR Negative 05/03/2023 1010   PROTEINUR NEGATIVE 11/19/2021 1352   NITRITE Negative 05/03/2023 1010   NITRITE NEGATIVE  11/19/2021 1352   LEUKOCYTESUR Negative 05/03/2023 1010   LEUKOCYTESUR NEGATIVE 11/19/2021 1352    Pertinent Imaging: Urine negative  Assessment & Plan: We decided to treat infections as needed.  If she starts getting them again we can go back on it she agreed with the plan.  See as needed  1. Urinary tract infection without hematuria, site unspecified  - Urinalysis, Complete   No follow-ups on file.  Martina Sinner, MD  [facility] [address], [address] [place], [place] [address] [phone]

## 2023-08-23 ENCOUNTER — Telehealth: Payer: Self-pay

## 2023-08-23 NOTE — Telephone Encounter (Signed)
Patient called about prep Instruction for [place].

## 2023-08-30 ENCOUNTER — Encounter
Admission: RE | Disposition: A | Discharge status: Home / Self Care | Payer: Self-pay | Source: Home / Self Care | Attending: Gastroenterology

## 2023-08-30 ENCOUNTER — Ambulatory Visit
Admission: RE | Admit: 2023-08-30 | Discharge: 2023-08-30 | Disposition: A | Discharge status: Home / Self Care | Payer: Medicare Other | Attending: Gastroenterology | Admitting: Gastroenterology

## 2023-08-30 ENCOUNTER — Ambulatory Visit: Payer: Medicare Other | Admitting: Anesthesiology

## 2023-08-30 ENCOUNTER — Encounter: Payer: Self-pay | Admitting: *Deleted

## 2023-08-30 DIAGNOSIS — K567 Ileus, unspecified: Secondary | ICD-10-CM | POA: Diagnosis not present

## 2023-08-30 DIAGNOSIS — I10 Essential (primary) hypertension: Secondary | ICD-10-CM | POA: Diagnosis not present

## 2023-08-30 DIAGNOSIS — Z1211 Encounter for screening for malignant neoplasm of colon: Secondary | ICD-10-CM | POA: Diagnosis present

## 2023-08-30 DIAGNOSIS — D120 Benign neoplasm of cecum: Secondary | ICD-10-CM | POA: Insufficient documentation

## 2023-08-30 DIAGNOSIS — K641 Second degree hemorrhoids: Secondary | ICD-10-CM | POA: Diagnosis not present

## 2023-08-30 DIAGNOSIS — I2510 Atherosclerotic heart disease of native coronary artery without angina pectoris: Secondary | ICD-10-CM | POA: Diagnosis not present

## 2023-08-30 DIAGNOSIS — Z853 Personal history of malignant neoplasm of breast: Secondary | ICD-10-CM | POA: Insufficient documentation

## 2023-08-30 HISTORY — PX: COLONOSCOPY WITH PROPOFOL: SHX5780

## 2023-08-30 HISTORY — PX: POLYPECTOMY: SHX5525

## 2023-08-30 SURGERY — COLONOSCOPY WITH PROPOFOL
Anesthesia: General

## 2023-08-30 MED ORDER — LIDOCAINE HCL (PF) 2 % IJ SOLN
INTRAMUSCULAR | Status: AC
  Filled 2023-08-30: qty 5

## 2023-08-30 MED ORDER — SODIUM CHLORIDE 0.9 % IV SOLN: INTRAVENOUS | Status: DC

## 2023-08-30 MED ORDER — PROPOFOL 500 MG/50ML IV EMUL
INTRAVENOUS | Status: DC | PRN
  Administered 2023-08-30: 150 ug/kg/min via INTRAVENOUS
  Administered 2023-08-30: 70 mg via INTRAVENOUS

## 2023-08-30 NOTE — Interval H&P Note (Signed)
History and Physical Interval Note:  08/30/2023 10:00 AM  Suzanne Escobar  has presented today for surgery, with the diagnosis of CCA SCREEN.  The various methods of treatment have been discussed with the patient and family. After consideration of risks, benefits and other options for treatment, the patient has consented to  Procedure(s): COLONOSCOPY WITH PROPOFOL (N/A) as a surgical intervention.  The patient's history has been reviewed, patient examined, no change in status, stable for surgery.  I have reviewed the patient's chart and labs.  Questions were answered to the patient's satisfaction.     Regis Bill  Ok to proceed with colonoscopy

## 2023-08-30 NOTE — Anesthesia Postprocedure Evaluation (Signed)
Anesthesia Post Note  Patient: Suzanne Escobar  Procedure(s) Performed: COLONOSCOPY WITH PROPOFOL POLYPECTOMY  Patient location during evaluation: Endoscopy Anesthesia Type: General Level of consciousness: awake and alert Pain management: pain level controlled Vital Signs Assessment: post-procedure vital signs reviewed and stable Respiratory status: spontaneous breathing, nonlabored ventilation, respiratory function stable and patient connected to nasal cannula oxygen Cardiovascular status: blood pressure returned to baseline and stable Postop Assessment: no apparent nausea or vomiting Anesthetic complications: no   There were no known notable events for this encounter.   Last Vitals:  Vitals:   08/30/23 1035 08/30/23 1045  BP: 125/64 (!) 117/98  Pulse: 80 79  Resp: 18 13  Temp:    SpO2: 100% 100%    Last Pain:  Vitals:   08/30/23 1045  TempSrc:   PainSc: 0-No pain                 Cleda Mccreedy Yahya Boldman

## 2023-08-30 NOTE — Transfer of Care (Signed)
Immediate Anesthesia Transfer of Care Note  Patient: Suzanne Escobar  Procedure(s) Performed: COLONOSCOPY WITH PROPOFOL POLYPECTOMY  Patient Location: PACU  Anesthesia Type:General  Level of Consciousness: drowsy  Airway & Oxygen Therapy: Patient Spontanous Breathing  Post-op Assessment: Report given to RN and Post -op Vital signs reviewed and stable  Post vital signs: Reviewed and stable  Last Vitals:  Vitals Value Taken Time  BP    Temp    Pulse    Resp 14 08/30/23 1025  SpO2    Vitals shown include unfiled device data.  Last Pain:  Vitals:   08/30/23 0945  TempSrc: Temporal         Complications: There were no known notable events for this encounter.

## 2023-08-30 NOTE — Op Note (Signed)
[facility] Patient Name: Suzanne Escobar Procedure Date: 08/30/2023 9:52 AM MRN: [id number] Account #: [id number] Date of Birth: 10-21-1945 Admit Type: Outpatient Age: 77 Room: [facility] ENDO ROOM 3 Gender: Female Note Status: Finalized Instrument Name: Prentice Docker [id number] Procedure:             Colonoscopy Indications:           Screening for colorectal malignant neoplasm Providers:             Eather Colas MD, MD Referring MD:          Enid Baas, MD (Referring MD) Medicines:             Monitored Anesthesia Care Complications:         No immediate complications. Estimated blood loss:                         Minimal. Procedure:             Pre-Anesthesia Assessment:                        - Prior to the procedure, a History and Physical was                         performed, and patient medications and allergies were                         reviewed. The patient is competent. The risks and                         benefits of the procedure and the sedation options and                         risks were discussed with the patient. All questions                         were answered and informed consent was obtained.                         Patient identification and proposed procedure were                         verified by the physician, the nurse, the                         anesthesiologist, the anesthetist and the technician                         in the endoscopy suite. Mental Status Examination:                         alert and oriented. Airway Examination: normal                         oropharyngeal airway and neck mobility. Respiratory                         Examination: clear to auscultation. CV Examination:  normal. Prophylactic Antibiotics: The patient does not                         require prophylactic antibiotics. Prior                         Anticoagulants: The patient has taken no  anticoagulant                         or antiplatelet agents. ASA Grade Assessment: III - A                         patient with severe systemic disease. After reviewing                         the risks and benefits, the patient was deemed in                         satisfactory condition to undergo the procedure. The                         anesthesia plan was to use monitored anesthesia care                         (MAC). Immediately prior to administration of                         medications, the patient was re-assessed for adequacy                         to receive sedatives. The heart rate, respiratory                         rate, oxygen saturations, blood pressure, adequacy of                         pulmonary ventilation, and response to care were                         monitored throughout the procedure. The physical                         status of the patient was re-assessed after the                         procedure.                        After obtaining informed consent, the colonoscope was                         passed under direct vision. Throughout the procedure,                         the patient's blood pressure, pulse, and oxygen                         saturations were monitored continuously. The  Colonoscope was introduced through the anus and                         advanced to the the cecum, identified by appendiceal                         orifice and ileocecal valve. The colonoscopy was                         performed without difficulty. The patient tolerated                         the procedure well. The quality of the bowel                         preparation was good. The ileocecal valve, appendiceal                         orifice, and rectum were photographed. Findings:      The perianal and digital rectal examinations were normal.      A 5 mm polyp was found in the ileocecal valve. The polyp was sessile.       The polyp  was removed with a cold snare. Resection and retrieval were       complete. Estimated blood loss was minimal.      Internal hemorrhoids were found during retroflexion. The hemorrhoids       were Grade II (internal hemorrhoids that prolapse but reduce       spontaneously).      The exam was otherwise without abnormality on direct and retroflexion       views. Impression:            - One 5 mm polyp at the ileocecal valve, removed with                         a cold snare. Resected and retrieved.                        - Internal hemorrhoids.                        - The examination was otherwise normal on direct and                         retroflexion views. Recommendation:        - Discharge patient to home.                        - Resume previous diet.                        - Continue present medications.                        - Await pathology results.                        - Repeat colonoscopy is not recommended due to current  age (68 years or older) for surveillance.                        - Return to referring physician as previously                         scheduled. Procedure Code(s):     --- Professional ---                        [phone], Colonoscopy, flexible; with removal of                         tumor(s), polyp(s), or other lesion(s) by snare                         technique Diagnosis Code(s):     --- Professional ---                        Z12.11, Encounter for screening for malignant neoplasm                         of colon                        D12.0, Benign neoplasm of cecum                        K64.1, Second degree hemorrhoids CPT copyright 2022 [employer]. All rights reserved. The codes documented in this report are preliminary and upon coder review may  be revised to meet current compliance requirements. Eather Colas MD, MD 08/30/2023 10:27:36 AM Number of Addenda: 0 Note Initiated On: 08/30/2023 9:52  AM Scope Withdrawal Time: 0 hours 8 minutes 57 seconds  Total Procedure Duration: 0 hours 14 minutes 9 seconds  Estimated Blood Loss:  Estimated blood loss was minimal.      [facility]

## 2023-08-30 NOTE — H&P (Signed)
Outpatient short stay form Pre-procedure 08/30/2023  Regis Bill, MD  Primary Physician: Enid Baas, MD  Reason for visit:  Screening colonoscopy  History of present illness:    77 y/o lady with history of arthritis, hypertension, and history of breast cancer here for screening colonoscopy. Last colonoscopy in 2014 was unremarkable. No blood thinners. No family history of GI malignancies. History of hysterectomy and cholecystectomy.    Current Facility-Administered Medications:    0.9 %  sodium chloride infusion, , Intravenous, Continuous, Kaysha Parsell, Rossie Muskrat, MD, Last Rate: 20 mL/hr at 08/30/23 0953, Continued from Pre-op at 08/30/23 0953  Medications Prior to Admission  Medication Sig Dispense Refill Last Dose   amitriptyline (ELAVIL) 50 MG tablet Take 50 mg by mouth at bedtime.   Past Month   metoprolol succinate (TOPROL-XL) 25 MG 24 hr tablet Take 25 mg by mouth every evening.   08/29/2023   Vitamin D-Vitamin K (K2 PLUS D3 PO) Take 1 tablet by mouth daily.   Past Week   acetaminophen (TYLENOL) 500 MG tablet Take 1,000 mg by mouth every 6 (six) hours as needed.      cyanocobalamin (VITAMIN B12) 1000 MCG tablet Take 1,000 mcg by mouth daily.      furosemide (LASIX) 20 MG tablet Take 20 mg by mouth daily as needed for edema. (Patient not taking: Reported on 08/30/2023)   Not Taking   letrozole (FEMARA) 2.5 MG tablet Take 1 tablet (2.5 mg total) by mouth daily. 30 tablet 3    magnesium oxide (MAG-OX) 400 MG tablet Take 400 mg by mouth daily.      RESTASIS 0.05 % ophthalmic emulsion Place 1 drop into both eyes 2 (two) times daily.      rosuvastatin (CRESTOR) 5 MG tablet Take 5 mg by mouth daily.      Semaglutide-Weight Management 0.25 MG/0.5ML SOAJ Inject 0.25 mg into the skin once a week.   08/20/2023   trimethoprim (TRIMPEX) 100 MG tablet Take 1 tablet (100 mg total) by mouth daily. (Patient not taking: Reported on 07/19/2023) 30 tablet 11      Allergies  Allergen  Reactions   Hydrocodone-Acetaminophen Itching    Can take if she takes benadryl with it.       Past Medical History:  Diagnosis Date   Anemia    Ankle fracture, left    Arthritis    Complication of anesthesia    a.) delayed emergence with routine colonoscopy; "I was given too much medicine". b.) PONV. c.) postoperative delirium; hallucinations.   Coronary artery calcification seen on CT scan    a.) Coronary CTA 11/03/2011: calcium score 0. b.) LHC 01/03/2013: normal coronary anatomy. c.) CTA 04/04/2021: mild coronary artery calcifications.   Dysrhythmia    Endometriosis    Fibromyalgia    GERD (gastroesophageal reflux disease)    Hearing impaired    Hiatal hernia    History of hiatal hernia    HLD (hyperlipidemia)    HTN (hypertension)    IBS (irritable bowel syndrome)    Mitral valve prolapse    Palpitations    Personal history of radiation therapy    PONV (postoperative nausea and vomiting)    Pulmonary HTN (HCC) 08/21/2020   a.) TTE 08/21/2020: mild elevated PASP of 38 mmHg.   Skin cancer    SOB (shortness of breath)     Review of systems:  Otherwise negative.    Physical Exam  Gen: Alert, oriented. Appears stated age.  HEENT: PERRLA. Lungs: No respiratory distress  CV: RRR Abd: soft, benign, no masses Ext: No edema    Planned procedures: Proceed with colonoscopy. The patient understands the nature of the planned procedure, indications, risks, alternatives and potential complications including but not limited to bleeding, infection, perforation, damage to internal organs and possible oversedation/side effects from anesthesia. The patient agrees and gives consent to proceed.  Please refer to procedure notes for findings, recommendations and patient disposition/instructions.     Regis Bill, MD [facility]

## 2023-08-30 NOTE — Anesthesia Preprocedure Evaluation (Signed)
Anesthesia Evaluation  Patient identified by MRN, date of birth, ID band Patient awake    Reviewed: Allergy & Precautions, NPO status , Patient's Chart, lab work & pertinent test results  History of Anesthesia Complications (+) PONV, PROLONGED EMERGENCE and history of anesthetic complications  Airway Mallampati: III  TM Distance: <3 FB Neck ROM: full    Dental  (+) Chipped   Pulmonary neg pulmonary ROS   Pulmonary exam normal        Cardiovascular hypertension, + CAD  Normal cardiovascular exam+ dysrhythmias      Neuro/Psych  PSYCHIATRIC DISORDERS       Neuromuscular disease    GI/Hepatic Neg liver ROS, hiatal hernia,GERD  Controlled,,  Endo/Other  negative endocrine ROS    Renal/GU negative Renal ROS  negative genitourinary   Musculoskeletal   Abdominal   Peds  Hematology negative hematology ROS (+)   Anesthesia Other Findings Past Medical History: No date: Anemia No date: Ankle fracture, left No date: Arthritis No date: Complication of anesthesia     Comment:  a.) delayed emergence with routine colonoscopy; "I was               given too much medicine". b.) PONV. c.) postoperative               delirium; hallucinations. No date: Coronary artery calcification seen on CT scan     Comment:  a.) Coronary CTA 11/03/2011: calcium score 0. b.) LHC               01/03/2013: normal coronary anatomy. c.) CTA 04/04/2021:               mild coronary artery calcifications. No date: Dysrhythmia No date: Endometriosis No date: Fibromyalgia No date: GERD (gastroesophageal reflux disease) No date: Hearing impaired No date: Hiatal hernia No date: History of hiatal hernia No date: HLD (hyperlipidemia) No date: HTN (hypertension) No date: IBS (irritable bowel syndrome) No date: Mitral valve prolapse No date: Palpitations No date: Personal history of radiation therapy No date: PONV (postoperative nausea and  vomiting) 08/21/2020: Pulmonary HTN (HCC)     Comment:  a.) TTE 08/21/2020: mild elevated PASP of 38 mmHg. No date: Skin cancer No date: SOB (shortness of breath)  Past Surgical History: No date: ABDOMINAL HYSTERECTOMY 12/03/2021: ANTERIOR CERVICAL DECOMP/DISCECTOMY FUSION; N/A     Comment:  Procedure: C6-7 ANTERIOR CERVICAL DISCECTOMY AND FUSION               (GLOBUS HEDRON);  Surgeon: Venetia Night, MD;                Location: [facility] ORS;  Service: Neurosurgery;  Laterality:               N/A; No date: APPENDECTOMY No date: BACK SURGERY     Comment:  cervical fusion and at tailbone No date: BLEPHAROPLASTY; Bilateral 06/08/2022: BREAST BIOPSY; Left     Comment:  stereo calcs coil clip path pending No date: BREAST LUMPECTOMY 07/03/2022: BREAST LUMPECTOMY WITH NEEDLE LOCALIZATION; N/A     Comment:  Procedure: BREAST LUMPECTOMY WITH NEEDLE LOCALIZATION;                Surgeon: Earline Mayotte, MD;  Location: [facility] ORS;                Service: General;  Laterality: N/A; No date: CARDIAC CATHETERIZATION     Comment:  X 3 No date: CATARACT EXTRACTION, BILATERAL No date: CHOLECYSTECTOMY No date: COLONOSCOPY W/  POLYPECTOMY No date: COLONOSCOPY WITH ESOPHAGOGASTRODUODENOSCOPY (EGD) No date: FOOT SURGERY; Bilateral No date: KYPHOPLASTY No date: LAPAROSCOPY     Comment:  OOPHERECTOMY 01/03/2013: LEFT HEART CATH AND CORONARY ANGIOGRAPHY; Left     Comment:  Procedure: LEFT HEART CATH AND CORONARY ANGIOGRAPHY;               Location: [facility]; Surgeon: Alfonse Alpers, MD 11/18/2020: ORIF WRIST FRACTURE; Left     Comment:  Procedure: OPEN REDUCTION INTERNAL FIXATION OF LEFT               DISTAL RADIUS FRACTURE;  Surgeon: Donato Heinz, MD;                Location: [facility];  Service: Orthopedics;  Laterality:               Left; No date: SEPTOPLASTY No date: SHOULDER ARTHROSCOPY DISTAL CLAVICLE EXCISION AND OPEN  ROTATOR CUFF REPAIR; Bilateral     Comment:  X2 No date: TOTAL HIP  ARTHROPLASTY; Bilateral     Reproductive/Obstetrics negative OB ROS                             Anesthesia Physical Anesthesia Plan  ASA: 3  Anesthesia Plan: General   Post-op Pain Management:    Induction: Intravenous  PONV Risk Score and Plan: Propofol infusion and TIVA  Airway Management Planned: Natural Airway and Nasal Cannula  Additional Equipment:   Intra-op Plan:   Post-operative Plan:   Informed Consent: I have reviewed the patients History and Physical, chart, labs and discussed the procedure including the risks, benefits and alternatives for the proposed anesthesia with the patient or authorized representative who has indicated his/her understanding and acceptance.     Dental Advisory Given  Plan Discussed with: Anesthesiologist, CRNA and Surgeon  Anesthesia Plan Comments: (Patient consented for risks of anesthesia including but not limited to:  - adverse reactions to medications - risk of airway placement if required - damage to eyes, teeth, lips or other oral mucosa - nerve damage due to positioning  - sore throat or hoarseness - Damage to heart, brain, nerves, lungs, other parts of body or loss of life  Patient voiced understanding and assent.)       Anesthesia Quick Evaluation

## 2023-08-31 ENCOUNTER — Encounter: Payer: Self-pay | Admitting: Gastroenterology

## 2023-08-31 LAB — SURGICAL PATHOLOGY

## 2023-09-30 ENCOUNTER — Other Ambulatory Visit: Payer: Self-pay | Admitting: Physical Medicine & Rehabilitation

## 2023-09-30 DIAGNOSIS — M5441 Lumbago with sciatica, right side: Secondary | ICD-10-CM

## 2023-10-04 ENCOUNTER — Ambulatory Visit
Admission: RE | Admit: 2023-10-04 | Discharge: 2023-10-04 | Disposition: A | Discharge status: Home / Self Care | Payer: Medicare Other | Source: Ambulatory Visit | Attending: Physical Medicine & Rehabilitation | Admitting: Physical Medicine & Rehabilitation

## 2023-10-04 DIAGNOSIS — M5441 Lumbago with sciatica, right side: Secondary | ICD-10-CM

## 2023-10-06 ENCOUNTER — Other Ambulatory Visit: Payer: Medicare Other

## 2023-10-14 ENCOUNTER — Other Ambulatory Visit: Payer: Medicare Other

## 2023-10-26 ENCOUNTER — Other Ambulatory Visit: Payer: Self-pay | Admitting: Oncology

## 2023-11-02 ENCOUNTER — Other Ambulatory Visit: Payer: Self-pay | Admitting: Physical Medicine & Rehabilitation

## 2023-11-02 DIAGNOSIS — M5414 Radiculopathy, thoracic region: Secondary | ICD-10-CM

## 2023-12-31 ENCOUNTER — Other Ambulatory Visit: Payer: Self-pay | Admitting: Physical Medicine & Rehabilitation

## 2023-12-31 DIAGNOSIS — M5412 Radiculopathy, cervical region: Secondary | ICD-10-CM

## 2024-01-03 ENCOUNTER — Other Ambulatory Visit: Payer: Self-pay | Admitting: Physical Medicine & Rehabilitation

## 2024-01-03 DIAGNOSIS — M5414 Radiculopathy, thoracic region: Secondary | ICD-10-CM

## 2024-01-05 NOTE — Discharge Instructions (Signed)
 Post Procedure Spinal Discharge Instruction Sheet  You may resume a regular diet and any medications that you routinely take (including pain medications) unless otherwise noted by MD.  No driving day of procedure.  Light activity throughout the rest of the day.  Do not do any strenuous work, exercise, bending or lifting.  The day following the procedure, you can resume normal physical activity but you should refrain from exercising or physical therapy for at least three days thereafter.  You may apply ice to the injection site, 20 minutes on, 20 minutes off, as needed. Do not apply ice directly to skin.    Common Side Effects:  Headaches- take your usual medications as directed by your physician.  Increase your fluid intake.  Caffeinated beverages may be helpful.  Lie flat in bed until your headache resolves.  Restlessness or inability to sleep- you may have trouble sleeping for the next few days.  Ask your referring physician if you need any medication for sleep.  Facial flushing or redness- should subside within a few days.  Increased pain- a temporary increase in pain a day or two following your procedure is not unusual.  Take your pain medication as prescribed by your referring physician.  Leg cramps  Please contact our office at [phone] for the following symptoms: Fever greater than 100 degrees. Headaches unresolved with medication after 2-3 days. Increased swelling, pain, or redness at injection site.   Thank you for visiting [facility] today!Post Procedure Spinal Discharge Instruction Sheet  You may resume a regular diet and any medications that you routinely take (including pain medications) unless otherwise noted by MD.  No driving day of procedure.  Light activity throughout the rest of the day.  Do not do any strenuous work, exercise, bending or lifting.  The day following the procedure, you can resume normal physical activity but you should refrain from  exercising or physical therapy for at least three days thereafter.  You may apply ice to the injection site, 20 minutes on, 20 minutes off, as needed. Do not apply ice directly to skin.    Common Side Effects:  Headaches- take your usual medications as directed by your physician.  Increase your fluid intake.  Caffeinated beverages may be helpful.  Lie flat in bed until your headache resolves.  Restlessness or inability to sleep- you may have trouble sleeping for the next few days.  Ask your referring physician if you need any medication for sleep.  Facial flushing or redness- should subside within a few days.  Increased pain- a temporary increase in pain a day or two following your procedure is not unusual.  Take your pain medication as prescribed by your referring physician.  Leg cramps  Please contact our office at [phone] for the following symptoms: Fever greater than 100 degrees. Headaches unresolved with medication after 2-3 days. Increased swelling, pain, or redness at injection site.   Thank you for visiting [facility]  today!

## 2024-01-06 ENCOUNTER — Ambulatory Visit
Admission: RE | Admit: 2024-01-06 | Discharge: 2024-01-06 | Disposition: A | Discharge status: Home / Self Care | Source: Ambulatory Visit | Attending: Physical Medicine & Rehabilitation | Admitting: Physical Medicine & Rehabilitation

## 2024-01-06 DIAGNOSIS — M5412 Radiculopathy, cervical region: Secondary | ICD-10-CM

## 2024-01-06 MED ORDER — IOPAMIDOL (ISOVUE-M 300) INJECTION 61%
3.0000 mL | Freq: Once | INTRAMUSCULAR | Status: AC | PRN
  Administered 2024-01-06: 3 mL via EPIDURAL

## 2024-01-06 MED ORDER — LIDOCAINE 1 % OPTIME INJ - NO CHARGE
5.0000 mL | Freq: Once | INTRAMUSCULAR | Status: AC
  Administered 2024-01-06: 5 mL via INTRADERMAL

## 2024-01-06 MED ORDER — TRIAMCINOLONE ACETONIDE 40 MG/ML IJ SUSP (RADIOLOGY)
60.0000 mg | Freq: Once | INTRAMUSCULAR | Status: AC
Start: 2024-01-06 — End: 2024-01-06
  Administered 2024-01-06: 60 mg via EPIDURAL

## 2024-01-17 ENCOUNTER — Inpatient Hospital Stay: Admission: RE | Admit: 2024-01-17 | Source: Ambulatory Visit

## 2024-01-18 ENCOUNTER — Inpatient Hospital Stay: Payer: Medicare Other | Admitting: Oncology

## 2024-01-24 ENCOUNTER — Ambulatory Visit
Admission: RE | Admit: 2024-01-24 | Discharge: 2024-01-24 | Disposition: A | Discharge status: Home / Self Care | Source: Ambulatory Visit | Attending: Physical Medicine & Rehabilitation | Admitting: Physical Medicine & Rehabilitation

## 2024-01-24 DIAGNOSIS — M5414 Radiculopathy, thoracic region: Secondary | ICD-10-CM

## 2024-01-25 ENCOUNTER — Encounter: Payer: Self-pay | Admitting: Oncology

## 2024-01-25 ENCOUNTER — Inpatient Hospital Stay: Attending: Oncology | Admitting: Oncology

## 2024-01-25 VITALS — BP 138/55 | HR 77 | Temp 97.0°F | Resp 18 | Ht 64.0 in | Wt 189.9 lb

## 2024-01-25 DIAGNOSIS — M8580 Other specified disorders of bone density and structure, unspecified site: Secondary | ICD-10-CM | POA: Insufficient documentation

## 2024-01-25 DIAGNOSIS — Z800 Family history of malignant neoplasm of digestive organs: Secondary | ICD-10-CM | POA: Diagnosis not present

## 2024-01-25 DIAGNOSIS — D0512 Intraductal carcinoma in situ of left breast: Secondary | ICD-10-CM | POA: Insufficient documentation

## 2024-01-25 DIAGNOSIS — Z8049 Family history of malignant neoplasm of other genital organs: Secondary | ICD-10-CM | POA: Insufficient documentation

## 2024-01-25 DIAGNOSIS — Z79811 Long term (current) use of aromatase inhibitors: Secondary | ICD-10-CM | POA: Diagnosis not present

## 2024-01-25 DIAGNOSIS — Z807 Family history of other malignant neoplasms of lymphoid, hematopoietic and related tissues: Secondary | ICD-10-CM | POA: Insufficient documentation

## 2024-01-25 DIAGNOSIS — Z79899 Other long term (current) drug therapy: Secondary | ICD-10-CM

## 2024-01-25 DIAGNOSIS — Z08 Encounter for follow-up examination after completed treatment for malignant neoplasm: Secondary | ICD-10-CM | POA: Diagnosis not present

## 2024-01-25 DIAGNOSIS — Z803 Family history of malignant neoplasm of breast: Secondary | ICD-10-CM | POA: Diagnosis not present

## 2024-01-25 DIAGNOSIS — Z86000 Personal history of in-situ neoplasm of breast: Secondary | ICD-10-CM

## 2024-01-25 NOTE — Progress Notes (Signed)
 Patient states she's starting to bruise easily and not on any blood thinners so she would like an answer as to why this is happening.

## 2024-01-25 NOTE — Progress Notes (Signed)
 Hematology/Oncology Consult note [facility]  Telephone:([phone] [phone]  Patient Care Team: Rex Castor, MD as PCP - General (Internal Medicine) Constancia Delton, MD as PCP - Cardiology (Cardiology) Waverly Hageman, RN as Oncology Nurse Navigator Avonne Boettcher, MD as Consulting Physician (Oncology) Eldred Grego, MD as Consulting Physician (General Surgery) Glenis Langdon, MD as Consulting Physician (Radiation Oncology)   Name of the patient: Suzanne Escobar  [id number]  15-May-1946   Date of visit: 01/25/24  Diagnosis-left breast DCIS ER positive  Chief complaint/ Reason for visit-routine follow-up of left breast DCIS  Heme/Onc history: patient is a 78 year old female who underwent a routine screening marrow gram in July 2023 which showed suspicious calcifications in her left breast.  This was followed by diagnostic mammogram which showed 10 mm group of calcifications in the upper outer quadrant of the left breast.  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  Patient has met with Dr. Marquita Situ and will be undergoing lumpectomy at some point next month.  She is otherwise doing well for her age.  There is a family history of breast cancer in 2 of her sisters.  Menarche at the age of 37.  She is G3 P3.  Age at first birth 70.  She is not presently on hormone replacement therapy.  No prior history of breast biopsies.   Final pathology showed 16 mm grade 2 DCIS with close   Inferior-anterior margin of 0.5 mm. No evidence of invasive malignancy.  Patient completed adjuvant radiation therapy.  She has not started endocrine therapy yet baseline bone density scan showed osteopenia.  She started taking letrozole  in August 2024    Interval history-tolerating letrozole  well presently without any significant side effects.  Denies any significant hot flashes.  She has undergone multiple surgeries in the past and has chronic joint pain which is  overall stable and no worse with letrozole   ECOG PS- 1 Pain scale- 3   Review of systems- Review of Systems  Constitutional:  Positive for malaise/fatigue. Negative for chills, fever and weight loss.  HENT:  Negative for congestion, ear discharge and nosebleeds.   Eyes:  Negative for blurred vision.  Respiratory:  Negative for cough, hemoptysis, sputum production, shortness of breath and wheezing.   Cardiovascular:  Negative for chest pain, palpitations, orthopnea and claudication.  Gastrointestinal:  Negative for abdominal pain, blood in stool, constipation, diarrhea, heartburn, melena, nausea and vomiting.  Genitourinary:  Negative for dysuria, flank pain, frequency, hematuria and urgency.  Musculoskeletal:  Positive for joint pain. Negative for back pain and myalgias.  Skin:  Negative for rash.  Neurological:  Negative for dizziness, tingling, focal weakness, seizures, weakness and headaches.  Endo/Heme/Allergies:  Does not bruise/bleed easily.  Psychiatric/Behavioral:  Negative for depression and suicidal ideas. The patient does not have insomnia.       Allergies  Allergen Reactions   Hydrocodone-Acetaminophen  Itching    Can take if she takes benadryl with it.       Past Medical History:  Diagnosis Date   Anemia    Ankle fracture, left    Arthritis    Complication of anesthesia    a.) delayed emergence with routine colonoscopy; "I was given too much medicine". b.) PONV. c.) postoperative delirium; hallucinations.   Coronary artery calcification seen on CT scan    a.) Coronary CTA 11/03/2011: calcium score 0. b.) LHC 01/03/2013: normal coronary anatomy. c.) CTA 04/04/2021: mild coronary artery calcifications.   Dysrhythmia    Endometriosis  Fibromyalgia    GERD (gastroesophageal reflux disease)    Hearing impaired    Hiatal hernia    History of hiatal hernia    HLD (hyperlipidemia)    HTN (hypertension)    IBS (irritable bowel syndrome)    Mitral valve prolapse     Palpitations    Personal history of radiation therapy    PONV (postoperative nausea and vomiting)    Pulmonary HTN (HCC) 08/21/2020   a.) TTE 08/21/2020: mild elevated PASP of 38 mmHg.   Skin cancer    SOB (shortness of breath)      Past Surgical History:  Procedure Laterality Date   ABDOMINAL HYSTERECTOMY     ANTERIOR CERVICAL DECOMP/DISCECTOMY FUSION N/A 12/03/2021   Procedure: C6-7 ANTERIOR CERVICAL DISCECTOMY AND FUSION (GLOBUS HEDRON);  Surgeon: Jodeen Munch, MD;  Location: [facility];  Service: Neurosurgery;  Laterality: N/A;   APPENDECTOMY     BACK SURGERY     cervical fusion and at tailbone   BLEPHAROPLASTY Bilateral    BREAST BIOPSY Left 06/08/2022   stereo calcs coil clip path pending   BREAST LUMPECTOMY     BREAST LUMPECTOMY WITH NEEDLE LOCALIZATION N/A 07/03/2022   Procedure: BREAST LUMPECTOMY WITH NEEDLE LOCALIZATION;  Surgeon: Marshall Skeeter, MD;  Location: [facility];  Service: General;  Laterality: N/A;   CARDIAC CATHETERIZATION     X 3   CATARACT EXTRACTION, BILATERAL     CHOLECYSTECTOMY     COLONOSCOPY W/ POLYPECTOMY     COLONOSCOPY WITH ESOPHAGOGASTRODUODENOSCOPY (EGD)     COLONOSCOPY WITH PROPOFOL  N/A 08/30/2023   Procedure: COLONOSCOPY WITH PROPOFOL ;  Surgeon: Shane Darling, MD;  Location: [facility];  Service: Endoscopy;  Laterality: N/A;   FOOT SURGERY Bilateral    KYPHOPLASTY     LAPAROSCOPY     OOPHERECTOMY   LEFT HEART CATH AND CORONARY ANGIOGRAPHY Left 01/03/2013   Procedure: LEFT HEART CATH AND CORONARY ANGIOGRAPHY; Location: [facility]; Surgeon: Daun Epstein, MD   ORIF WRIST FRACTURE Left 11/18/2020   Procedure: OPEN REDUCTION INTERNAL FIXATION OF LEFT DISTAL RADIUS FRACTURE;  Surgeon: Arlyne Lame, MD;  Location: [facility];  Service: Orthopedics;  Laterality: Left;   POLYPECTOMY  08/30/2023   Procedure: POLYPECTOMY;  Surgeon: Shane Darling, MD;  Location: [facility];  Service: Endoscopy;;   SEPTOPLASTY     SHOULDER  ARTHROSCOPY DISTAL CLAVICLE EXCISION AND OPEN ROTATOR CUFF REPAIR Bilateral    X2   TOTAL HIP ARTHROPLASTY Bilateral     Social History   Socioeconomic History   Marital status: Widowed    Spouse name: Not on file   Number of children: Not on file   Years of education: Not on file   Highest education level: Not on file  Occupational History   Not on file  Tobacco Use   Smoking status: Never    Passive exposure: Never   Smokeless tobacco: Never  Vaping Use   Vaping status: Never Used  Substance and Sexual Activity   Alcohol use: Yes    Comment: occasional wine   Drug use: Never   Sexual activity: Not on file  Other Topics Concern   Not on file  Social History Narrative   Not on file   Social Drivers of Health   Financial Resource Strain: Low Risk  (07/14/2023)   Received from [facility]   Overall Financial Resource Strain (CARDIA)    Difficulty of Paying Living Expenses: Not hard at all  Food Insecurity: No Food Insecurity (  07/14/2023)   Received from [facility]   Hunger Vital Sign    Worried About Running Out of Food in the Last Year: Never true    Ran Out of Food in the Last Year: Never true  Transportation Needs: No Transportation Needs (07/14/2023)   Received from [facility] - Transportation    In the past 12 months, has lack of transportation kept you from medical appointments or from getting medications?: No    Lack of Transportation (Non-Medical): No  Physical Activity: Inactive (10/26/2019)   Received from [facility], [facility]   Exercise Vital Sign    Days of Exercise per Week: 0 days    Minutes of Exercise per Session: 0 min  Stress: No Stress Concern Present (10/26/2019)   Received from [facility], [facility] - Occupational Stress Questionnaire    Feeling of Stress : Not at all  Social Connections: Moderately Integrated  (10/26/2019)   Received from [facility], [facility]   Social Connection and Isolation Panel [NHANES]    Frequency of Communication with Friends and Family: More than three times a week    Frequency of Social Gatherings with Friends and Family: More than three times a week    Attends Religious Services: More than 4 times per year    Active Member of [employer] or Organizations: Yes    Attends Banker Meetings: More than 4 times per year    Marital Status: Widowed  Intimate Partner Violence: Not At Risk (10/26/2019)   Received from [facility], [facility]   Humiliation, Afraid, Rape, and Kick questionnaire    Fear of Current or Ex-Partner: No    Emotionally Abused: No    Physically Abused: No    Sexually Abused: No    Family History  Problem Relation Age of Onset   Heart attack Father    Heart disease Father    Breast cancer Sister        d. 58s; metastatic   Breast cancer Sister        dx 61   Heart attack Brother    Lymphoma Brother    Breast cancer Maternal Aunt        dx twice   Throat cancer Paternal Aunt    Cervical cancer Paternal Aunt    Breast cancer Cousin        maternal cousin   Cervical cancer Niece      Current Outpatient Medications:    ALPRAZolam (XANAX) 0.5 MG tablet, Take 0.5 mg by mouth., Disp: , Rfl:    nitrofurantoin, macrocrystal-monohydrate, (MACROBID) 100 MG capsule, Take 100 mg by mouth 2 (two) times daily., Disp: , Rfl:    acetaminophen  (TYLENOL ) 500 MG tablet, Take 1,000 mg by mouth every 6 (six) hours as needed., Disp: , Rfl:    amitriptyline (ELAVIL) 50 MG tablet, Take 50 mg by mouth at bedtime., Disp: , Rfl:    cyanocobalamin (VITAMIN B12) 1000 MCG tablet, Take 1,000 mcg by mouth daily., Disp: , Rfl:    furosemide (LASIX) 20 MG tablet, Take 20 mg by mouth daily as needed for edema. (Patient not taking: Reported on 08/30/2023), Disp: , Rfl:    letrozole  (FEMARA ) 2.5 MG tablet, TAKE 1 TABLET(2.5 MG) BY MOUTH DAILY, Disp:  30 tablet, Rfl: 3   magnesium oxide (MAG-OX) 400 MG tablet, Take 400 mg by mouth daily., Disp: ,  Rfl:    metoprolol  succinate (TOPROL -XL) 25 MG 24 hr tablet, Take 25 mg by mouth every evening., Disp: , Rfl:    RESTASIS 0.05 % ophthalmic emulsion, Place 1 drop into both eyes 2 (two) times daily., Disp: , Rfl:    rosuvastatin (CRESTOR) 5 MG tablet, Take 5 mg by mouth daily., Disp: , Rfl:    Semaglutide-Weight Management 0.25 MG/0.5ML SOAJ, Inject 0.25 mg into the skin once a week., Disp: , Rfl:    trimethoprim  (TRIMPEX ) 100 MG tablet, Take 1 tablet (100 mg total) by mouth daily. (Patient not taking: Reported on 07/19/2023), Disp: 30 tablet, Rfl: 11   Vitamin D-Vitamin K (K2 PLUS D3 PO), Take 1 tablet by mouth daily., Disp: , Rfl:   Physical exam:  Vitals:   01/25/24 1327  BP: (!) 138/55  Pulse: 77  Resp: 18  Temp: (!) 97 F (36.1 C)  TempSrc: Tympanic  SpO2: 100%  Weight: 189 lb 14.4 oz (86.1 kg)  Height: 5\' 4"  (1.626 m)   Physical Exam Cardiovascular:     Rate and Rhythm: Normal rate and regular rhythm.     Heart sounds: Normal heart sounds.  Pulmonary:     Effort: Pulmonary effort is normal.     Breath sounds: Normal breath sounds.  Abdominal:     General: Bowel sounds are normal.     Palpations: Abdomen is soft.  Skin:    General: Skin is warm and dry.  Neurological:     Mental Status: She is alert and oriented to person, place, and time.    Breast exam was performed in seated and lying down position. Patient is status post left lumpectomy with a well-healed surgical scar. No evidence of any palpable masses. No evidence of axillary adenopathy. No evidence of any palpable masses or lumps in the right breast. No evidence of right axillary adenopathy   I have personally reviewed labs listed below:    Latest Ref Rng & Units 07/19/2023   11:22 AM  CMP  Glucose 70 - 99 mg/dL 213   BUN 8 - 23 mg/dL 19   Creatinine 0.86 - 1.00 mg/dL 5.78   Sodium 469 - 629 mmol/L 139    Potassium 3.5 - 5.1 mmol/L 4.3   Chloride 98 - 111 mmol/L 105   CO2 22 - 32 mmol/L 26   Calcium 8.9 - 10.3 mg/dL 8.6   Total Protein 6.5 - 8.1 g/dL 6.9   Total Bilirubin 0.3 - 1.2 mg/dL 0.4   Alkaline Phos 38 - 126 U/L 59   AST 15 - 41 U/L 16   ALT 0 - 44 U/L 14       Latest Ref Rng & Units 07/19/2023   11:22 AM  CBC  WBC 4.0 - 10.5 K/uL 6.2   Hemoglobin 12.0 - 15.0 g/dL 52.8   Hematocrit 41.3 - 46.0 % 38.6   Platelets 150 - 400 K/uL 250      DG INJECT DIAG/THERA/INC NEEDLE/CATH/PLC EPI/CERV/THOR W/IMG Result Date: 01/06/2024 CLINICAL DATA:  77 year old female with cervicalgia and bilateral upper extremity radiculopathy. She has experienced significant improvement with prior cervical ESI. She presents with recurrent symptoms. FLUOROSCOPY: Radiation Exposure Index (as provided by the fluoroscopic device): 11.6 mGy Kerma PROCEDURE: CERVICAL EPIDURAL INJECTION An interlaminar approach was performed on the left at C7-T1. A 20 gauge Touhy epidural needle was advanced using loss-of-resistance technique. DIAGNOSTIC EPIDURAL INJECTION Injection of Isovue -M 300 shows a good epidural pattern with spread above and below the level of needle placement,  primarily on the left. No vascular opacification is seen. THERAPEUTIC EPIDURAL INJECTION 1.5 ml of Kenalog  40 mixed with 2 ml of normal saline were then instilled. The procedure was well-tolerated, and the patient was discharged thirty minutes following the injection in good condition. IMPRESSION: Technically successful epidural injection on the left at C7-T1. Electronically Signed   By: Fernando Hoyer M.D.   On: 01/06/2024 15:30     Assessment and plan- Patient is a 78 y.o. female here for routine follow-up of DCIS presently on letrozole   Clinically patient is doing well with no concerning signs and symptoms of recurrence based on today's exam.  She is tolerating letrozole  well and will continue for 5 years ending in August 2029.  Have encouraged  her to take calcium 1200 mg along with vitamin D 800 international units as well.  Bone density scan does show osteopenia which we will continue to monitor.  She does not require any bisphosphonates at this time I will see her back in 6 months no labs.  Mammogram would be coordinated by Dr. Charmel Cooter in August 2025   Visit Diagnosis 1. Use of letrozole  (Femara )   2. High risk medication use   3. Encounter for follow-up surveillance of ductal carcinoma in situ (DCIS) of breast      Dr. Seretha Dance, MD, MPH [facility] at [facility] [phone] 01/25/2024 4:00 PM

## 2024-02-19 ENCOUNTER — Other Ambulatory Visit: Payer: Self-pay | Admitting: Oncology

## 2024-03-30 ENCOUNTER — Other Ambulatory Visit: Payer: Self-pay | Admitting: General Surgery

## 2024-03-30 DIAGNOSIS — Z853 Personal history of malignant neoplasm of breast: Secondary | ICD-10-CM

## 2024-05-02 ENCOUNTER — Other Ambulatory Visit: Payer: Self-pay | Admitting: General Surgery

## 2024-05-02 DIAGNOSIS — Z853 Personal history of malignant neoplasm of breast: Secondary | ICD-10-CM

## 2024-05-03 ENCOUNTER — Encounter

## 2024-05-05 ENCOUNTER — Ambulatory Visit
Admission: RE | Admit: 2024-05-05 | Discharge: 2024-05-05 | Disposition: A | Discharge status: Home / Self Care | Source: Ambulatory Visit | Attending: General Surgery | Admitting: General Surgery

## 2024-05-05 DIAGNOSIS — Z853 Personal history of malignant neoplasm of breast: Secondary | ICD-10-CM | POA: Diagnosis present

## 2024-05-08 ENCOUNTER — Other Ambulatory Visit: Payer: Self-pay | Admitting: General Surgery

## 2024-05-08 DIAGNOSIS — R928 Other abnormal and inconclusive findings on diagnostic imaging of breast: Secondary | ICD-10-CM

## 2024-05-10 ENCOUNTER — Ambulatory Visit
Admission: RE | Admit: 2024-05-10 | Discharge: 2024-05-10 | Disposition: A | Discharge status: Home / Self Care | Source: Ambulatory Visit | Attending: General Surgery | Admitting: General Surgery

## 2024-05-10 DIAGNOSIS — R928 Other abnormal and inconclusive findings on diagnostic imaging of breast: Secondary | ICD-10-CM | POA: Insufficient documentation

## 2024-05-10 MED ORDER — LIDOCAINE 1 % OPTIME INJ - NO CHARGE
5.0000 mL | Freq: Once | INTRAMUSCULAR | Status: AC
  Administered 2024-05-10 (×2): 5 mL
  Filled 2024-05-10: qty 6

## 2024-05-10 MED ORDER — LIDOCAINE-EPINEPHRINE 1 %-1:100000 IJ SOLN
20.0000 mL | Freq: Once | INTRAMUSCULAR | Status: AC
  Administered 2024-05-10 (×2): 20 mL
  Filled 2024-05-10: qty 20

## 2024-05-11 LAB — SURGICAL PATHOLOGY

## 2024-05-30 ENCOUNTER — Ambulatory Visit: Attending: Internal Medicine

## 2024-05-30 ENCOUNTER — Other Ambulatory Visit: Payer: Self-pay

## 2024-05-30 DIAGNOSIS — N3941 Urge incontinence: Secondary | ICD-10-CM | POA: Insufficient documentation

## 2024-05-30 DIAGNOSIS — R293 Abnormal posture: Secondary | ICD-10-CM | POA: Diagnosis present

## 2024-05-30 DIAGNOSIS — M6281 Muscle weakness (generalized): Secondary | ICD-10-CM | POA: Diagnosis present

## 2024-05-30 DIAGNOSIS — R2689 Other abnormalities of gait and mobility: Secondary | ICD-10-CM | POA: Diagnosis present

## 2024-05-30 NOTE — Therapy (Signed)
 OUTPATIENT PHYSICAL THERAPY FEMALE PELVIC EVALUATION   Patient Name: Suzanne Escobar MRN: [id number] DOB:18-Jan-1946, 78 y.o., female Today's Date: 05/30/2024  END OF SESSION:  PT End of Session - 05/30/24 1330     Visit Number 1    Number of Visits 9    Date for PT Re-Evaluation 07/29/24    Authorization Type Medicare and [employer]    Progress Note Due on Visit 10    PT Start Time 1308    PT Stop Time 1402    PT Time Calculation (min) 54 min    Activity Tolerance Patient tolerated treatment well    Behavior During Therapy WFL for tasks assessed/performed          Past Medical History:  Diagnosis Date   Anemia    Ankle fracture, left    Arthritis    Complication of anesthesia    a.) delayed emergence with routine colonoscopy; I was given too much medicine. b.) PONV. c.) postoperative delirium; hallucinations.   Coronary artery calcification seen on CT scan    a.) Coronary CTA 11/03/2011: calcium score 0. b.) LHC 01/03/2013: normal coronary anatomy. c.) CTA 04/04/2021: mild coronary artery calcifications.   Dysrhythmia    Endometriosis    Fibromyalgia    GERD (gastroesophageal reflux disease)    Hearing impaired    Hiatal hernia    History of hiatal hernia    HLD (hyperlipidemia)    HTN (hypertension)    IBS (irritable bowel syndrome)    Mitral valve prolapse    Palpitations    Personal history of radiation therapy    PONV (postoperative nausea and vomiting)    Pulmonary HTN (HCC) 08/21/2020   a.) TTE 08/21/2020: mild elevated PASP of 38 mmHg.   Skin cancer    SOB (shortness of breath)    Past Surgical History:  Procedure Laterality Date   ABDOMINAL HYSTERECTOMY     ANTERIOR CERVICAL DECOMP/DISCECTOMY FUSION N/A 12/03/2021   Procedure: C6-7 ANTERIOR CERVICAL DISCECTOMY AND FUSION (GLOBUS HEDRON);  Surgeon: Clois Fret, MD;  Location: [facility];  Service: Neurosurgery;  Laterality: N/A;   APPENDECTOMY     BACK SURGERY     cervical fusion and at  tailbone   BLEPHAROPLASTY Bilateral    BREAST BIOPSY Left 05/10/2024   Stereo bx, X clip, path pending   BREAST BIOPSY Left 05/10/2024   MM LT BREAST BX W LOC DEV 1ST LESION IMAGE BX SPEC STEREO GUIDE 05/10/2024 [facility]   BREAST LUMPECTOMY WITH NEEDLE LOCALIZATION N/A 07/03/2022   Procedure: BREAST LUMPECTOMY WITH NEEDLE LOCALIZATION;  Surgeon: Dessa Reyes ORN, MD;  Location: [facility];  Service: General;  Laterality: N/A;   CARDIAC CATHETERIZATION     X 3   CATARACT EXTRACTION, BILATERAL     CHOLECYSTECTOMY     COLONOSCOPY W/ POLYPECTOMY     COLONOSCOPY WITH ESOPHAGOGASTRODUODENOSCOPY (EGD)     COLONOSCOPY WITH PROPOFOL  N/A 08/30/2023   Procedure: COLONOSCOPY WITH PROPOFOL ;  Surgeon: Maryruth Ole DASEN, MD;  Location: [facility];  Service: Endoscopy;  Laterality: N/A;   FOOT SURGERY Bilateral    KYPHOPLASTY     LAPAROSCOPY     OOPHERECTOMY   LEFT HEART CATH AND CORONARY ANGIOGRAPHY Left 01/03/2013   Procedure: LEFT HEART CATH AND CORONARY ANGIOGRAPHY; Location: [facility]; Surgeon: Oneil Hibbs, MD   ORIF WRIST FRACTURE Left 11/18/2020   Procedure: OPEN REDUCTION INTERNAL FIXATION OF LEFT DISTAL RADIUS FRACTURE;  Surgeon: Mardee Lynwood SQUIBB, MD;  Location: [facility];  Service: Orthopedics;  Laterality:  Left;   POLYPECTOMY  08/30/2023   Procedure: POLYPECTOMY;  Surgeon: Maryruth Ole DASEN, MD;  Location: [facility];  Service: Endoscopy;;   SEPTOPLASTY     SHOULDER ARTHROSCOPY DISTAL CLAVICLE EXCISION AND OPEN ROTATOR CUFF REPAIR Bilateral    X2   TOTAL HIP ARTHROPLASTY Bilateral    Patient Active Problem List   Diagnosis Date Noted   Genetic testing 08/06/2022   Ductal carcinoma in situ (DCIS) of left breast 06/17/2022   Cervical radiculitis 03/13/2020   Stenosis of cervical spine region 03/13/2020   Complex tear of medial meniscus of right knee as current injury 03/04/2020   Chronic pain of both knees 09/19/2019   Insomnia 02/27/2019   Lumbar degenerative disc  disease 08/09/2018   Anxiety 01/19/2018   Controlled substance agreement signed 01/19/2018   H/O bilateral hip replacements 01/04/2018   Synovial plica syndrome of right knee 11/23/2017   Excessive daytime sleepiness 04/14/2017   Fibromyalgia 01/11/2017   Vitamin D deficiency 01/11/2017   Chronic coronary artery disease 01/18/2015   Hyperlipidemia 01/18/2015   Hypertension 01/18/2015   Mitral valve prolapse 01/18/2015   Chalazion 12/05/2013   Dermatochalasis 11/08/2012    PCP: Lavenia Beaver, MD  REFERRING PROVIDER: Lavenia Beaver, MD  REFERRING DIAG: Urge incontinence  THERAPY DIAG:  Urge incontinence - Plan: PT plan of care cert/re-cert  Muscle weakness (generalized) - Plan: PT plan of care cert/re-cert  Abnormal posture - Plan: PT plan of care cert/re-cert  Other abnormalities of gait and mobility - Plan: PT plan of care cert/re-cert  Rationale for Evaluation and Treatment: Rehabilitation  ONSET DATE: 05/01/24 referral date  SUBJECTIVE:                                                                                                                                                                                           SUBJECTIVE STATEMENT: URINARY FUNCTION: pt leaks getting out of the car, gets up 0-3x/night (and when she gets up she doesn't make it to the toilet), when she starts leaking she can't hold it. Hx of chronic UTIs. Denied pain with urination. Pt does not typically experience SUI. She used to use a pantyliner but not now.  BOWEL FUNCTION: daily, and takes probiotic. Hx of hemorrhoids but they don't bother pt. Had a colonoscopy recently and a polyp was found and removed.  CORE STABILITY: hx of B THA, hx of fall in 2022 and broke L ankle, hysterectomy at age of 33, cx fusion and back surgery, R shoulder replacement (8 surgeries between both shoulders), hx of 29 surgeries. Did have a breast biopsy this month and hx of breast CA two years  ago. Hysterectomy was  via abdominal cavity. SEXUAL FUNCTION: pt not sexually active. Pt did not have hx of pain with intercourse or OBGYN exam.  Fluid intake: She drinks 1-2 cups of coffee, water throughout the day.   PAIN:  Are you having pain? No NPRS scale: 0/10  PRECAUTIONS: None  RED FLAGS: None   WEIGHT BEARING RESTRICTIONS: No  FALLS:  Has patient fallen in last 6 months? No  OCCUPATION:  retired  ACTIVITY LEVEL : tries to walk at [employer] or [employer] with cart (intervals) on most days  PLOF: Independent  PATIENT GOALS: strengthen her core and pelvic floor, so she doesn't leak   PERTINENT HISTORY:  Hx of Breast CA, arthritis, 29 surgeries, GERD, HLD, HTN, fibromyalgia, hiatal hernia, dysrhythmia, skin CA, pulmonary HTN, anemia, hearing impaired, IBS, appendectomy, cholecystectomy, hysterectomy, B TKA, B shoulder arthroscopy Sexual abuse: No  BOWEL MOVEMENT: Pain with bowel movement: No Type of bowel movement:Frequency daily Fully empty rectum: Yes:   Leakage: No Pads: No Fiber supplement/laxative No but does take miralax prn.  URINATION: Pain with urination: No Fully empty bladder: Yes:   Stream: Strong Urgency: Yes  Frequency: every few hours Leakage: Urge to void and Walking to the bathroom Pads: No but used to wear pantyliner  INTERCOURSE:  Not currently sexually active (widowed) but no hx of pain with intercourse  PREGNANCY: pregnancies: 4 Vaginal deliveries 4 Tearing: she had episiotomy Episiotomy Yes  C-section deliveries 0 Currently pregnant No  PROLAPSE: None per urologist per pt   OBJECTIVE:  Note: Objective measures were completed at Evaluation unless otherwise noted.  COGNITION: Overall cognitive status: Within functional limits for tasks assessed     SENSATION: Light touch: Appears intact except for decr. RLE sensation over THA scar per pt    GAIT: Assistive device utilized: None Comments: decr. Trunk rotation and incr. Lateral  sway  POSTURE: increased lumbar lordosis and increased thoracic kyphosis with R shoulder elevated in stance   LUMBARAROM/PROM:   A/PROM A/PROM  eval  Flexion WFL  Extension Limited by approx. 50%  Right lateral flexion Limited by approx. 25%  Left lateral flexion Limited by approx. 25%  Right rotation Limited by approx. 25%  Left rotation Limited by approx. 25%   (Blank rows = not tested)  LOWER EXTREMITY ROM:  Active ROM Right eval Left eval  Hip flexion    Hip extension    Hip abduction    Hip adduction    Hip internal rotation    Hip external rotation    Knee flexion    Knee extension    Ankle dorsiflexion    Ankle plantarflexion    Ankle inversion    Ankle eversion     (Blank rows = not tested)  LOWER EXTREMITY MMT:  MMT Right eval Left eval  Hip flexion    Hip extension    Hip abduction    Hip adduction    Hip internal rotation    Hip external rotation    Knee flexion    Knee extension    Ankle dorsiflexion    Ankle plantarflexion    Ankle inversion    Ankle eversion     (Blank rows = not tested) PALPATION: No TTP over spine in standing, incr. Postural sway during R and L SLS.  PELVIC MMT:   MMT eval  Vaginal   Internal Anal Sphincter   External Anal Sphincter   Puborectalis   Diastasis Recti   (Blank rows = not tested)  TONE: limited by time constraints   PROLAPSE: limited by time constraints   TODAY'S TREATMENT:                                                                                                                              DATE: 05/30/24  EVAL   SELF CARE: PATIENT EDUCATION:  Education details: PT educated pt on main functions of the pelvic floor, IAP, breath and PFM relationship. PT discussed POC, frequency and duration. PT provided the following education: TOILET POSTURE: Urination: feet flat, lean forward with forearms on legs to fully empty bladder. Bowel movement: place feet flat on Squatty Potty or stool  so knees are higher than hips, lean forward to relax pelvic floor in order to avoid strain.  SHOES: wear supportive shoes, and sandals with straps.  POSTURE: try not to cross legs at knees or ankles. Try the figure four stretch instead.  WATER: start with water first thing in the morning.   PELVIC TILTS: try to stand in neutral, not tucking your tail and not arching back, but in the middle.  Person educated: Patient Education method: Explanation, Demonstration, and Handouts Education comprehension: verbalized understanding, returned demonstration, and needs further education  HOME EXERCISE PROGRAM: Not yet established  ASSESSMENT:  CLINICAL IMPRESSION: Patient is a pleasant 78 y.o. female who was seen today for physical therapy evaluation and treatment for urge incontinence.  Pt's PMH is significant for the following: Hx of Breast CA, arthritis, 29 surgeries, GERD, HLD, HTN, fibromyalgia, hiatal hernia, dysrhythmia, skin CA, pulmonary HTN, anemia, hearing impaired, IBS, appendectomy, cholecystectomy, hysterectomy, B TKA, B shoulder arthroscopy. The following impairments were noted upon exam: limited ROM, back and neck pain, postural dysfunction, decr. Strength likely 2/2 subjective reports and gait deviations, nocturia, urge UI. Pt would benefit from skilled PT to improve safety and decr. Pain during all ADLs.   OBJECTIVE IMPAIRMENTS: Abnormal gait, decreased balance, decreased coordination, decreased endurance, decreased mobility, decreased strength, hypomobility, increased fascial restrictions, impaired flexibility, and postural dysfunction.   ACTIVITY LIMITATIONS: sleeping, continence, and locomotion level  PARTICIPATION LIMITATIONS: interpersonal relationship, shopping, community activity, and church  PERSONAL FACTORS: Age, Past/current experiences, and 3+ comorbidities: see above are also affecting patient's functional outcome.   REHAB POTENTIAL: Good  CLINICAL DECISION  MAKING: Stable/uncomplicated  EVALUATION COMPLEXITY: Low   GOALS: Goals reviewed with patient? Yes  SHORT TERM GOALS: Target date: for all STGs: 06/27/24  Pt will be IND in HEP to improve pain, strength, coordination. Baseline: walks most days at [employer] or [employer] Goal status: INITIAL  2.  Finish exam and write goals as indicated. Baseline:  Goal status: INITIAL  3.  Pt will demo proper toileting posture to fully empty bladder and reduce straining during bowel movement. Baseline: unable to demo Goal status: INITIAL  4.  Pt will demonstrated improved relaxation and contraction of PFM with coordination of breath to reduce urinary leakage to </=three/week. Baseline: frequently after driving car  or getting up at night Goal status: INITIAL    LONG TERM GOALS: Target date: for all LTGs: 07/25/24  Pt will demonstrated improved relaxation and contraction of PFM with coordination of breath to reduce urinary leakage to </=once/week. Baseline: frequently after driving car or getting up at night Goal status: INITIAL  2.  Pt will be IND in advanced HEP to improve pain, strength, coordination. Baseline: walks most days at [employer] or [employer] store Goal status: INITIAL   PLAN: finish exam (palpation, ROM, MMT, DR) Establish HEP. Scar mobilization    PT FREQUENCY: 1x/week  PT DURATION: 8 weeks  PLANNED INTERVENTIONS: 97164- PT Re-evaluation, 97110-Therapeutic exercises, 97530- Therapeutic activity, 97112- Neuromuscular re-education, 97535- Self Care, [id number]- Manual therapy, [phone]- Gait training, [phone] (1-2 muscles), [id number] (3+ muscles)- Dry Needling, Patient/Family education, Balance training, Taping, Joint mobilization, Spinal mobilization, Scar mobilization, Cryotherapy, Moist heat, and Biofeedback    Pollyann Roa L, PT 05/30/2024, 2:19 PM  Delon Pinal, PT,DPT 05/30/24 2:19 PM Phone: [phone] Fax: [phone]

## 2024-05-30 NOTE — Patient Instructions (Signed)

## 2024-06-01 ENCOUNTER — Encounter

## 2024-06-06 ENCOUNTER — Other Ambulatory Visit: Payer: Self-pay

## 2024-06-06 ENCOUNTER — Ambulatory Visit

## 2024-06-06 DIAGNOSIS — N3941 Urge incontinence: Secondary | ICD-10-CM

## 2024-06-06 DIAGNOSIS — R293 Abnormal posture: Secondary | ICD-10-CM

## 2024-06-06 DIAGNOSIS — R2689 Other abnormalities of gait and mobility: Secondary | ICD-10-CM

## 2024-06-06 DIAGNOSIS — M6281 Muscle weakness (generalized): Secondary | ICD-10-CM

## 2024-06-06 NOTE — Therapy (Signed)
 OUTPATIENT PHYSICAL THERAPY FEMALE PELVIC TREATMENT   Patient Name: Mertie Haslem MRN: [id number] DOB:[birth year], 78 y.o., female Today's Date: 06/06/2024  END OF SESSION:  PT End of Session - 06/06/24 1534     Visit Number 2    Number of Visits 9    Date for PT Re-Evaluation 07/29/24    Authorization Type Medicare and [employer]    Progress Note Due on Visit 10    PT Start Time 1530    PT Stop Time 1619    PT Time Calculation (min) 49 min    Activity Tolerance Patient tolerated treatment well    Behavior During Therapy WFL for tasks assessed/performed          Past Medical History:  Diagnosis Date   Anemia    Ankle fracture, left    Arthritis    Complication of anesthesia    a.) delayed emergence with routine colonoscopy; I was given too much medicine. b.) PONV. c.) postoperative delirium; hallucinations.   Coronary artery calcification seen on CT scan    a.) Coronary CTA 11/03/2011: calcium score 0. b.) LHC 01/03/2013: normal coronary anatomy. c.) CTA 04/04/2021: mild coronary artery calcifications.   Dysrhythmia    Endometriosis    Fibromyalgia    GERD (gastroesophageal reflux disease)    Hearing impaired    Hiatal hernia    History of hiatal hernia    HLD (hyperlipidemia)    HTN (hypertension)    IBS (irritable bowel syndrome)    Mitral valve prolapse    Palpitations    Personal history of radiation therapy    PONV (postoperative nausea and vomiting)    Pulmonary HTN (HCC) 08/21/2020   a.) TTE 08/21/2020: mild elevated PASP of 38 mmHg.   Skin cancer    SOB (shortness of breath)    Past Surgical History:  Procedure Laterality Date   ABDOMINAL HYSTERECTOMY     ANTERIOR CERVICAL DECOMP/DISCECTOMY FUSION N/A 12/03/2021   Procedure: C6-7 ANTERIOR CERVICAL DISCECTOMY AND FUSION (GLOBUS HEDRON);  Surgeon: Clois Fret, MD;  Location: [facility];  Service: Neurosurgery;  Laterality: N/A;   APPENDECTOMY     BACK SURGERY     cervical fusion and at  tailbone   BLEPHAROPLASTY Bilateral    BREAST BIOPSY Left 05/10/2024   Stereo bx, X clip, path pending   BREAST BIOPSY Left 05/10/2024   MM LT BREAST BX W LOC DEV 1ST LESION IMAGE BX SPEC STEREO GUIDE 05/10/2024 [facility]   BREAST LUMPECTOMY WITH NEEDLE LOCALIZATION N/A 07/03/2022   Procedure: BREAST LUMPECTOMY WITH NEEDLE LOCALIZATION;  Surgeon: Dessa Reyes ORN, MD;  Location: [facility];  Service: General;  Laterality: N/A;   CARDIAC CATHETERIZATION     X 3   CATARACT EXTRACTION, BILATERAL     CHOLECYSTECTOMY     COLONOSCOPY W/ POLYPECTOMY     COLONOSCOPY WITH ESOPHAGOGASTRODUODENOSCOPY (EGD)     COLONOSCOPY WITH PROPOFOL  N/A 08/30/2023   Procedure: COLONOSCOPY WITH PROPOFOL ;  Surgeon: Maryruth Ole DASEN, MD;  Location: [facility];  Service: Endoscopy;  Laterality: N/A;   FOOT SURGERY Bilateral    KYPHOPLASTY     LAPAROSCOPY     OOPHERECTOMY   LEFT HEART CATH AND CORONARY ANGIOGRAPHY Left 01/03/2013   Procedure: LEFT HEART CATH AND CORONARY ANGIOGRAPHY; Location: [facility]; Surgeon: Oneil Hibbs, MD   ORIF WRIST FRACTURE Left 11/18/2020   Procedure: OPEN REDUCTION INTERNAL FIXATION OF LEFT DISTAL RADIUS FRACTURE;  Surgeon: Mardee Lynwood SQUIBB, MD;  Location: [facility];  Service: Orthopedics;  Laterality:  Left;   POLYPECTOMY  08/30/2023   Procedure: POLYPECTOMY;  Surgeon: Maryruth Ole DASEN, MD;  Location: [facility];  Service: Endoscopy;;   SEPTOPLASTY     SHOULDER ARTHROSCOPY DISTAL CLAVICLE EXCISION AND OPEN ROTATOR CUFF REPAIR Bilateral    X2   TOTAL HIP ARTHROPLASTY Bilateral    Patient Active Problem List   Diagnosis Date Noted   Genetic testing 08/06/2022   Ductal carcinoma in situ (DCIS) of left breast 06/17/2022   Cervical radiculitis 03/13/2020   Stenosis of cervical spine region 03/13/2020   Complex tear of medial meniscus of right knee as current injury 03/04/2020   Chronic pain of both knees 09/19/2019   Insomnia 02/27/2019   Lumbar degenerative disc  disease 08/09/2018   Anxiety 01/19/2018   Controlled substance agreement signed 01/19/2018   H/O bilateral hip replacements 01/04/2018   Synovial plica syndrome of right knee 11/23/2017   Excessive daytime sleepiness 04/14/2017   Fibromyalgia 01/11/2017   Vitamin D deficiency 01/11/2017   Chronic coronary artery disease 01/18/2015   Hyperlipidemia 01/18/2015   Hypertension 01/18/2015   Mitral valve prolapse 01/18/2015   Chalazion 12/05/2013   Dermatochalasis 11/08/2012    PCP: Lavenia Beaver, MD  REFERRING PROVIDER: Lavenia Beaver, MD  REFERRING DIAG: Urge incontinence  THERAPY DIAG:  Urge incontinence  Muscle weakness (generalized)  Abnormal posture  Other abnormalities of gait and mobility  Rationale for Evaluation and Treatment: Rehabilitation  ONSET DATE: 05/01/24 referral date  SUBJECTIVE:                                                                                                                                                                                           SUBJECTIVE STATEMENT: Pt reported she feels tired and she's been having water in the morning and walking some. She ordered a patch for weight loss, she's checking with her grand daughter (a PA). She's going to start a journal (fluids and food).   EVAL: URINARY FUNCTION: pt leaks getting out of the car, gets up 0-3x/night (and when she gets up she doesn't make it to the toilet), when she starts leaking she can't hold it. Hx of chronic UTIs. Denied pain with urination. Pt does not typically experience SUI. She used to use a pantyliner but not now.  BOWEL FUNCTION: daily, and takes probiotic. Hx of hemorrhoids but they don't bother pt. Had a colonoscopy recently and a polyp was found and removed.  CORE STABILITY: hx of B THA, hx of fall in 2022 and broke L ankle, hysterectomy at age of 84, cx fusion and back surgery, R shoulder replacement (8 surgeries between both shoulders), hx of 29  surgeries. Did  have a breast biopsy this month and hx of breast CA two years ago. Hysterectomy was via abdominal cavity. SEXUAL FUNCTION: pt not sexually active. Pt did not have hx of pain with intercourse or OBGYN exam.  Fluid intake: She drinks 1-2 cups of coffee, water throughout the day.   PAIN:  Are you having pain? No NPRS scale: 0/10  PRECAUTIONS: None  RED FLAGS: None   WEIGHT BEARING RESTRICTIONS: No  FALLS:  Has patient fallen in last 6 months? No  OCCUPATION:  retired  ACTIVITY LEVEL : tries to walk at [employer] or [employer] with cart (intervals) on most days  PLOF: Independent  PATIENT GOALS: strengthen her core and pelvic floor, so she doesn't leak   PERTINENT HISTORY:  Hx of Breast CA, arthritis, 29 surgeries, GERD, HLD, HTN, fibromyalgia, hiatal hernia, dysrhythmia, skin CA, pulmonary HTN, anemia, hearing impaired, IBS, appendectomy, cholecystectomy, hysterectomy, B TKA, B shoulder arthroscopy Sexual abuse: No  BOWEL MOVEMENT: Pain with bowel movement: No Type of bowel movement:Frequency daily Fully empty rectum: Yes:   Leakage: No Pads: No Fiber supplement/laxative No but does take miralax prn.  URINATION: Pain with urination: No Fully empty bladder: Yes:   Stream: Strong Urgency: Yes  Frequency: every few hours Leakage: Urge to void and Walking to the bathroom Pads: No but used to wear pantyliner  INTERCOURSE:  Not currently sexually active (widowed) but no hx of pain with intercourse  PREGNANCY: pregnancies: 4 Vaginal deliveries 4 Tearing: she had episiotomy Episiotomy Yes  C-section deliveries 0 Currently pregnant No  PROLAPSE: None per urologist per pt   OBJECTIVE:  Note: Objective measures were completed at Evaluation unless otherwise noted.  COGNITION: Overall cognitive status: Within functional limits for tasks assessed     SENSATION: Light touch: Appears intact except for decr. RLE sensation over THA scar per  pt    GAIT: Assistive device utilized: None Comments: decr. Trunk rotation and incr. Lateral sway  POSTURE: increased lumbar lordosis and increased thoracic kyphosis with R shoulder elevated in stance   LUMBARAROM/PROM:   A/PROM A/PROM  eval  Flexion WFL  Extension Limited by approx. 50%  Right lateral flexion Limited by approx. 25%  Left lateral flexion Limited by approx. 25%  Right rotation Limited by approx. 25%  Left rotation Limited by approx. 25%   (Blank rows = not tested)  LOWER EXTREMITY ROM:B hip IR and ER limited (especially IR). All other LE ROM WFL.  Active ROM Right eval Left eval  Hip flexion    Hip extension    Hip abduction    Hip adduction    Hip internal rotation    Hip external rotation    Knee flexion    Knee extension    Ankle dorsiflexion    Ankle plantarflexion    Ankle inversion    Ankle eversion     (Blank rows = not tested)  LOWER EXTREMITY MMT:  MMT Right eval Left eval  Hip flexion 4+ 4+  Hip extension    Hip abduction 3+ 3+  Hip adduction 3+ 3+  Hip internal rotation    Hip external rotation    Knee flexion 4 4  Knee extension 4+ 4+  Ankle dorsiflexion 5 5  Ankle plantarflexion    Ankle inversion    Ankle eversion     (Blank rows = not tested) PALPATION: No TTP over spine in standing, incr. Postural sway during R and L SLS  9/9: TTP over R glutes/hip rotators,  difficulty palpating PFM contraction (externally) even with PT providing cues to coordinate with breath. TTP over B upper traps and rounded shoulders noted. No DR noted.   PELVIC MMT:   MMT eval  Vaginal   Internal Anal Sphincter   External Anal Sphincter   Puborectalis   Diastasis Recti   (Blank rows = not tested)        TONE: Incr. Muscle tension in back musculature    PROLAPSE: No pressure reported.  TODAY'S TREATMENT:                                                                                                                               DATE: 06/06/24   Physical function test: PT completed exam (palpation, MMT, DR, ROM). See above for details.   NMR: Access Code: [email or web address] URL: [email or web address] Date: 06/06/2024 Prepared by: Delon Pinal  Exercises - Supine Angels  - 1 x daily - 7 x weekly - 1 sets - 10 reps - Sidelying Open Book  - 1 x daily - 7 x weekly - 1 sets - 10 reps - Sidelying Diaphragmatic Breathing  - 1 x daily - 7 x weekly - 1 sets - 5 reps - Standing Thoracic Spine Stretch  - 1 x daily - 7 x weekly - 1 sets - 3 reps - 30-45 hold Cues and demo for proper technique. S for safety. Mild R shoulder pain noted with angels, pt with hx of R reverse shoulder surgery.  SELF CARE: PATIENT EDUCATION:  Education details: PT educated pt on exam findings and new HEP and how PFPT requires inter-regional approach as PF tension is impacted by posture and strength. Person educated: Patient Education method: Explanation, Demonstration, and Handouts Education comprehension: verbalized understanding, returned demonstration, and needs further education  HOME EXERCISE PROGRAM: Not yet established  ASSESSMENT:  CLINICAL IMPRESSION: Skilled session focused on completing exam and establishing HEP to improve posture and mobility, to progress towards strength training. Pt's B hip IR is very limited, and hip ab/add are weak which is likely contributing to PFM dysfunction. Pt progressed to performing HEP IND after demo and cues. The following impairments continue to be noted: limited ROM, back and neck pain, postural dysfunction, decr. Strength, nocturia, urge UI. Pt would benefit from skilled PT to improve safety and decr. Pain during all ADLs.   OBJECTIVE IMPAIRMENTS: Abnormal gait, decreased balance, decreased coordination, decreased endurance, decreased mobility, decreased strength, hypomobility, increased fascial restrictions, impaired flexibility, and postural dysfunction.   ACTIVITY LIMITATIONS:  sleeping, continence, and locomotion level  PARTICIPATION LIMITATIONS: interpersonal relationship, shopping, community activity, and church  PERSONAL FACTORS: Age, Past/current experiences, and 3+ comorbidities: see above are also affecting patient's functional outcome.   REHAB POTENTIAL: Good  CLINICAL DECISION MAKING: Stable/uncomplicated  EVALUATION COMPLEXITY: Low   GOALS: Goals reviewed with patient? Yes  SHORT TERM GOALS: Target date: for all STGs: 06/27/24  Pt will be IND in HEP to improve pain, strength, coordination. Baseline:  walks most days at [employer] or [employer] Goal status: INITIAL  2.  Finish exam and write goals as indicated. Baseline:  Goal status: MET  3.  Pt will demo proper toileting posture to fully empty bladder and reduce straining during bowel movement. Baseline: unable to demo Goal status: INITIAL  4.  Pt will demonstrated improved relaxation and contraction of PFM with coordination of breath to reduce urinary leakage to </=three/week. Baseline: frequently after driving car or getting up at night Goal status: INITIAL    LONG TERM GOALS: Target date: for all LTGs: 07/25/24  Pt will demonstrated improved relaxation and contraction of PFM with coordination of breath to reduce urinary leakage to </=once/week. Baseline: frequently after driving car or getting up at night Goal status: INITIAL  2.  Pt will be IND in advanced HEP to improve pain, strength, coordination. Baseline: walks most days at [employer] or [employer] store Goal status: INITIAL   PLAN:review and progress HEP. Scar mobilization    PT FREQUENCY: 1x/week  PT DURATION: 8 weeks  PLANNED INTERVENTIONS: 97164- PT Re-evaluation, 97110-Therapeutic exercises, 97530- Therapeutic activity, 97112- Neuromuscular re-education, 97535- Self Care, [id number]- Manual therapy, [id number]- Gait training, [id number] (1-2 muscles), 20561 (3+ muscles)- Dry Needling, Patient/Family  education, Balance training, Taping, Joint mobilization, Spinal mobilization, Scar mobilization, Cryotherapy, Moist heat, and Biofeedback    Jahsiah Carpenter L, PT 06/06/2024, 4:31 PM  Delon Pinal, PT,DPT 06/06/24 4:31 PM Phone: [phone] Fax: [phone]

## 2024-06-08 ENCOUNTER — Encounter

## 2024-06-13 ENCOUNTER — Other Ambulatory Visit: Payer: Self-pay

## 2024-06-13 ENCOUNTER — Ambulatory Visit

## 2024-06-13 DIAGNOSIS — R293 Abnormal posture: Secondary | ICD-10-CM

## 2024-06-13 DIAGNOSIS — R2689 Other abnormalities of gait and mobility: Secondary | ICD-10-CM

## 2024-06-13 DIAGNOSIS — M6281 Muscle weakness (generalized): Secondary | ICD-10-CM

## 2024-06-13 DIAGNOSIS — N3941 Urge incontinence: Secondary | ICD-10-CM

## 2024-06-13 NOTE — Therapy (Signed)
 OUTPATIENT PHYSICAL THERAPY FEMALE PELVIC TREATMENT   Patient Name: Suzanne Escobar MRN: [id number] DOB:29-Jun-1946, 78 y.o., female Today's Date: 06/13/2024  END OF SESSION:  PT End of Session - 06/13/24 0848     Visit Number 3    Number of Visits 9    Date for PT Re-Evaluation 07/29/24    Authorization Type Medicare and [employer]    Progress Note Due on Visit 10    PT Start Time 0845    PT Stop Time 0927    PT Time Calculation (min) 42 min    Activity Tolerance Patient tolerated treatment well    Behavior During Therapy [facility] for tasks assessed/performed          Past Medical History:  Diagnosis Date   Anemia    Ankle fracture, left    Arthritis    Complication of anesthesia    a.) delayed emergence with routine colonoscopy; I was given too much medicine. b.) PONV. c.) postoperative delirium; hallucinations.   Coronary artery calcification seen on CT scan    a.) Coronary CTA 11/03/2011: calcium score 0. b.) LHC 01/03/2013: normal coronary anatomy. c.) CTA 04/04/2021: mild coronary artery calcifications.   Dysrhythmia    Endometriosis    Fibromyalgia    GERD (gastroesophageal reflux disease)    Hearing impaired    Hiatal hernia    History of hiatal hernia    HLD (hyperlipidemia)    HTN (hypertension)    IBS (irritable bowel syndrome)    Mitral valve prolapse    Palpitations    Personal history of radiation therapy    PONV (postoperative nausea and vomiting)    Pulmonary HTN (HCC) 08/21/2020   a.) TTE 08/21/2020: mild elevated PASP of 38 mmHg.   Skin cancer    SOB (shortness of breath)    Past Surgical History:  Procedure Laterality Date   ABDOMINAL HYSTERECTOMY     ANTERIOR CERVICAL DECOMP/DISCECTOMY FUSION N/A 12/03/2021   Procedure: C6-7 ANTERIOR CERVICAL DISCECTOMY AND FUSION (GLOBUS HEDRON);  Surgeon: Clois Fret, MD;  Location: [facility];  Service: Neurosurgery;  Laterality: N/A;   APPENDECTOMY     BACK SURGERY     cervical fusion and at  tailbone   BLEPHAROPLASTY Bilateral    BREAST BIOPSY Left 05/10/2024   Stereo bx, X clip, path pending   BREAST BIOPSY Left 05/10/2024   MM LT BREAST BX W LOC DEV 1ST LESION IMAGE BX SPEC STEREO GUIDE 05/10/2024 [facility]   BREAST LUMPECTOMY WITH NEEDLE LOCALIZATION N/A 07/03/2022   Procedure: BREAST LUMPECTOMY WITH NEEDLE LOCALIZATION;  Surgeon: Dessa Reyes ORN, MD;  Location: [facility];  Service: General;  Laterality: N/A;   CARDIAC CATHETERIZATION     X 3   CATARACT EXTRACTION, BILATERAL     CHOLECYSTECTOMY     COLONOSCOPY W/ POLYPECTOMY     COLONOSCOPY WITH ESOPHAGOGASTRODUODENOSCOPY (EGD)     COLONOSCOPY WITH PROPOFOL  N/A 08/30/2023   Procedure: COLONOSCOPY WITH PROPOFOL ;  Surgeon: Maryruth Ole DASEN, MD;  Location: [facility];  Service: Endoscopy;  Laterality: N/A;   FOOT SURGERY Bilateral    KYPHOPLASTY     LAPAROSCOPY     OOPHERECTOMY   LEFT HEART CATH AND CORONARY ANGIOGRAPHY Left 01/03/2013   Procedure: LEFT HEART CATH AND CORONARY ANGIOGRAPHY; Location: [facility]; Surgeon: Oneil Hibbs, MD   ORIF WRIST FRACTURE Left 11/18/2020   Procedure: OPEN REDUCTION INTERNAL FIXATION OF LEFT DISTAL RADIUS FRACTURE;  Surgeon: Mardee Lynwood SQUIBB, MD;  Location: [facility];  Service: Orthopedics;  Laterality:  Left;   POLYPECTOMY  08/30/2023   Procedure: POLYPECTOMY;  Surgeon: Maryruth Ole DASEN, MD;  Location[facility];  Service: Endoscopy;;   SEPTOPLASTY     SHOULDER ARTHROSCOPY DISTAL CLAVICLE EXCISION AND OPEN ROTATOR CUFF REPAIR Bilateral    X2   TOTAL HIP ARTHROPLASTY Bilateral    Patient Active Problem List   Diagnosis Date Noted   Genetic testing 08/06/2022   Ductal carcinoma in situ (DCIS) of left breast 06/17/2022   Cervical radiculitis 03/13/2020   Stenosis of cervical spine region 03/13/2020   Complex tear of medial meniscus of right knee as current injury 03/04/2020   Chronic pain of both knees 09/19/2019   Insomnia 02/27/2019   Lumbar degenerative disc  disease 08/09/2018   Anxiety 01/19/2018   Controlled substance agreement signed 01/19/2018   H/O bilateral hip replacements 01/04/2018   Synovial plica syndrome of right knee 11/23/2017   Excessive daytime sleepiness 04/14/2017   Fibromyalgia 01/11/2017   Vitamin D deficiency 01/11/2017   Chronic coronary artery disease 01/18/2015   Hyperlipidemia 01/18/2015   Hypertension 01/18/2015   Mitral valve prolapse 01/18/2015   Chalazion 12/05/2013   Dermatochalasis 11/08/2012    PCP: Lavenia Beaver, MD  REFERRING PROVIDER: Lavenia Beaver, MD  REFERRING DIAG: Urge incontinence  THERAPY DIAG:  Urge incontinence  Muscle weakness (generalized)  Abnormal posture  Other abnormalities of gait and mobility  Rationale for Evaluation and Treatment: Rehabilitation  ONSET DATE: 05/01/24 referral date  SUBJECTIVE:                                                                                                                                                                                           SUBJECTIVE STATEMENT: Pt reported she was visiting her sister this weekend and her neck was really sore as she didn't bring her pillow. She got a massage just for her neck and it feels much better. She wasn't able to perform her HEP because of travel and the neck pain. She saw her PCP. She hasn't had any leakage.  EVAL: URINARY FUNCTION: pt leaks getting out of the car, gets up 0-3x/night (and when she gets up she doesn't make it to the toilet), when she starts leaking she can't hold it. Hx of chronic UTIs. Denied pain with urination. Pt does not typically experience SUI. She used to use a pantyliner but not now.  BOWEL FUNCTION: daily, and takes probiotic. Hx of hemorrhoids but they don't bother pt. Had a colonoscopy recently and a polyp was found and removed.  CORE STABILITY: hx of B THA, hx of fall in 2022 and broke L ankle, hysterectomy at age of 51,  cx fusion and back surgery, R shoulder  replacement (8 surgeries between both shoulders), hx of 29 surgeries. Did have a breast biopsy this month and hx of breast CA two years ago. Hysterectomy was via abdominal cavity. SEXUAL FUNCTION: pt not sexually active. Pt did not have hx of pain with intercourse or OBGYN exam.  Fluid intake: She drinks 1-2 cups of coffee, water throughout the day.   PAIN:  Are you having pain? Yes NPRS scale: 2-3/10 neck pain Slept wrong and caused pain but massage helped.   PRECAUTIONS: None  RED FLAGS: None   WEIGHT BEARING RESTRICTIONS: No  FALLS:  Has patient fallen in last 6 months? No  OCCUPATION:  retired  ACTIVITY LEVEL : tries to walk at [employer] or [employer] with cart (intervals) on most days  PLOF: Independent  PATIENT GOALS: strengthen her core and pelvic floor, so she doesn't leak   PERTINENT HISTORY:  Hx of Breast CA, arthritis, 29 surgeries, GERD, HLD, HTN, fibromyalgia, hiatal hernia, dysrhythmia, skin CA, pulmonary HTN, anemia, hearing impaired, IBS, appendectomy, cholecystectomy, hysterectomy, B TKA, B shoulder arthroscopy Sexual abuse: No  BOWEL MOVEMENT: Pain with bowel movement: No Type of bowel movement:Frequency daily Fully empty rectum: Yes:   Leakage: No Pads: No Fiber supplement/laxative No but does take miralax prn.  URINATION: Pain with urination: No Fully empty bladder: Yes:   Stream: Strong Urgency: Yes  Frequency: every few hours Leakage: Urge to void and Walking to the bathroom Pads: No but used to wear pantyliner  INTERCOURSE:  Not currently sexually active (widowed) but no hx of pain with intercourse  PREGNANCY: pregnancies: 4 Vaginal deliveries 4 Tearing: she had episiotomy Episiotomy Yes  C-section deliveries 0 Currently pregnant No  PROLAPSE: None per urologist per pt   OBJECTIVE:  Note: Objective measures were completed at Evaluation unless otherwise noted.  COGNITION: Overall cognitive status: Within functional  limits for tasks assessed     SENSATION: Light touch: Appears intact except for decr. RLE sensation over THA scar per pt    GAIT: Assistive device utilized: None Comments: decr. Trunk rotation and incr. Lateral sway  POSTURE: increased lumbar lordosis and increased thoracic kyphosis with R shoulder elevated in stance   LUMBARAROM/PROM:   A/PROM A/PROM  eval  Flexion WFL  Extension Limited by approx. 50%  Right lateral flexion Limited by approx. 25%  Left lateral flexion Limited by approx. 25%  Right rotation Limited by approx. 25%  Left rotation Limited by approx. 25%   (Blank rows = not tested)  LOWER EXTREMITY ROM:B hip IR and ER limited (especially IR). All other LE ROM WFL.  Active ROM Right eval Left eval  Hip flexion    Hip extension    Hip abduction    Hip adduction    Hip internal rotation    Hip external rotation    Knee flexion    Knee extension    Ankle dorsiflexion    Ankle plantarflexion    Ankle inversion    Ankle eversion     (Blank rows = not tested)  LOWER EXTREMITY MMT:  MMT Right eval Left eval  Hip flexion 4+ 4+  Hip extension    Hip abduction 3+ 3+  Hip adduction 3+ 3+  Hip internal rotation    Hip external rotation    Knee flexion 4 4  Knee extension 4+ 4+  Ankle dorsiflexion 5 5  Ankle plantarflexion    Ankle inversion    Ankle eversion     (  Blank rows = not tested) PALPATION: No TTP over spine in standing, incr. Postural sway during R and L SLS  9/9: TTP over R glutes/hip rotators, difficulty palpating PFM contraction (externally) even with PT providing cues to coordinate with breath. TTP over B upper traps and rounded shoulders noted. No DR noted.   PELVIC MMT:   MMT eval  Vaginal   Internal Anal Sphincter   External Anal Sphincter   Puborectalis   Diastasis Recti   (Blank rows = not tested)        TONE: Incr. Muscle tension in back musculature    PROLAPSE: No pressure reported.  TODAY'S TREATMENT:                                                                                                                               DATE: 06/13/24     NMR: Access Code: [email or web address] URL: [email or web address] Date: 06/13/2024 Prepared by: Delon Pinal  Exercises - Supine Angels  - 1 x daily - 7 x weekly - 1 sets - 10 reps - Sidelying Open Book  - 1 x daily - 7 x weekly - 1 sets - 10 reps - Sidelying Diaphragmatic Breathing  - 1 x daily - 7 x weekly - 1 sets - 5 reps - Standing Thoracic Spine Stretch  - 1 x daily - 7 x weekly - 1 sets - 3 reps - 30-45 hold -TrA x 10 reps  - Hooklying March with TrA activation - 1 x daily - 7 x weekly - 1 sets - 10 reps - Seated Scapular Retraction  - 1 x daily - 3 x weekly - 3 sets - 10 reps Cues and demo for proper technique. S for safety.   MANUAL THERAPY. Patient Education - Scar Massage: PT performed STM and educated pt on scar mobilization of hysterectomy scar (mid to left part of scar with adhesions) and R hip THA scar with many adhesions..  SELF CARE: PATIENT EDUCATION:  Education details: PT educated on progressed HEP and scar mobilization to decr. Fascia tension. Person educated: Patient Education method: Explanation, Demonstration, and Handouts Education comprehension: verbalized understanding, returned demonstration, and needs further education  HOME EXERCISE PROGRAM: Not yet established  ASSESSMENT:  CLINICAL IMPRESSION: Skilled session focused on progressing HEP and performing scar mobilization to decr. Fascia tension which can incr. PFM tension and decr. Abs ability to contract effectively. Pt progressed to performing HEP IND after demo and cues. The following impairments continue to be noted: limited ROM, back and neck pain, postural dysfunction, decr. Strength, nocturia, urge UI. Pt would benefit from skilled PT to improve safety and decr. Pain during all ADLs.   OBJECTIVE IMPAIRMENTS: Abnormal gait, decreased balance, decreased  coordination, decreased endurance, decreased mobility, decreased strength, hypomobility, increased fascial restrictions, impaired flexibility, and postural dysfunction.   ACTIVITY LIMITATIONS: sleeping, continence, and locomotion level  PARTICIPATION LIMITATIONS: interpersonal relationship, shopping, community activity, and church  PERSONAL FACTORS: Age, Past/current experiences, and  3+ comorbidities: see above are also affecting patient's functional outcome.   REHAB POTENTIAL: Good  CLINICAL DECISION MAKING: Stable/uncomplicated  EVALUATION COMPLEXITY: Low   GOALS: Goals reviewed with patient? Yes  SHORT TERM GOALS: Target date: for all STGs: 06/27/24  Pt will be IND in HEP to improve pain, strength, coordination. Baseline: walks most days at [employer] or [employer] Goal status: INITIAL  2.  Finish exam and write goals as indicated. Baseline:  Goal status: MET  3.  Pt will demo proper toileting posture to fully empty bladder and reduce straining during bowel movement. Baseline: unable to demo Goal status: INITIAL  4.  Pt will demonstrated improved relaxation and contraction of PFM with coordination of breath to reduce urinary leakage to </=three/week. Baseline: frequently after driving car or getting up at night Goal status: INITIAL    LONG TERM GOALS: Target date: for all LTGs: 07/25/24  Pt will demonstrated improved relaxation and contraction of PFM with coordination of breath to reduce urinary leakage to </=once/week. Baseline: frequently after driving car or getting up at night Goal status: INITIAL  2.  Pt will be IND in advanced HEP to improve pain, strength, coordination. Baseline: walks most days at [employer] or [employer] store Goal status: INITIAL   PLAN: Review HEP prn, internal assessment prn, manual therapy, strengthening progressing    PT FREQUENCY: 1x/week  PT DURATION: 8 weeks  PLANNED INTERVENTIONS: [id number]- PT  Re-evaluation, 97110-Therapeutic exercises, 97530- Therapeutic activity, 97112- Neuromuscular re-education, 97535- Self Care, [id number]- Manual therapy, [id number]- Gait training, [phone] (1-2 muscles), [id number] (3+ muscles)- Dry Needling, Patient/Family education, Balance training, Taping, Joint mobilization, Spinal mobilization, Scar mobilization, Cryotherapy, Moist heat, and Biofeedback    Anddy Wingert L, PT 06/13/2024, 8:49 AM  Delon Pinal, PT,DPT 06/13/24 8:49 AM Phone: [phone] Fax: [phone]

## 2024-06-15 ENCOUNTER — Encounter

## 2024-06-19 ENCOUNTER — Encounter

## 2024-06-20 ENCOUNTER — Encounter

## 2024-06-22 ENCOUNTER — Encounter

## 2024-06-27 ENCOUNTER — Ambulatory Visit

## 2024-06-27 ENCOUNTER — Other Ambulatory Visit: Payer: Self-pay

## 2024-06-27 DIAGNOSIS — M6281 Muscle weakness (generalized): Secondary | ICD-10-CM

## 2024-06-27 DIAGNOSIS — N3941 Urge incontinence: Secondary | ICD-10-CM | POA: Diagnosis not present

## 2024-06-27 DIAGNOSIS — R2689 Other abnormalities of gait and mobility: Secondary | ICD-10-CM

## 2024-06-27 DIAGNOSIS — R293 Abnormal posture: Secondary | ICD-10-CM

## 2024-06-27 NOTE — Therapy (Signed)
 OUTPATIENT PHYSICAL THERAPY FEMALE PELVIC TREATMENT   Patient Name: Suzanne Escobar MRN: [id number] DOB:25-Aug-1946, 78 y.o., female Today's Date: 06/27/2024  END OF SESSION:  PT End of Session - 06/27/24 0851     Visit Number 4    Number of Visits 9    Date for Recertification  07/29/24    Authorization Type Medicare and [employer]    Progress Note Due on Visit 10    PT Start Time 0848    PT Stop Time 0928    PT Time Calculation (min) 40 min    Activity Tolerance Patient tolerated treatment well    Behavior During Therapy [facility] for tasks assessed/performed          Past Medical History:  Diagnosis Date   Anemia    Ankle fracture, left    Arthritis    Complication of anesthesia    a.) delayed emergence with routine colonoscopy; I was given too much medicine. b.) PONV. c.) postoperative delirium; hallucinations.   Coronary artery calcification seen on CT scan    a.) Coronary CTA 11/03/2011: calcium score 0. b.) LHC 01/03/2013: normal coronary anatomy. c.) CTA 04/04/2021: mild coronary artery calcifications.   Dysrhythmia    Endometriosis    Fibromyalgia    GERD (gastroesophageal reflux disease)    Hearing impaired    Hiatal hernia    History of hiatal hernia    HLD (hyperlipidemia)    HTN (hypertension)    IBS (irritable bowel syndrome)    Mitral valve prolapse    Palpitations    Personal history of radiation therapy    PONV (postoperative nausea and vomiting)    Pulmonary HTN (HCC) 08/21/2020   a.) TTE 08/21/2020: mild elevated PASP of 38 mmHg.   Skin cancer    SOB (shortness of breath)    Past Surgical History:  Procedure Laterality Date   ABDOMINAL HYSTERECTOMY     ANTERIOR CERVICAL DECOMP/DISCECTOMY FUSION N/A 12/03/2021   Procedure: C6-7 ANTERIOR CERVICAL DISCECTOMY AND FUSION (GLOBUS HEDRON);  Surgeon: Clois Fret, MD;  Location: [facility];  Service: Neurosurgery;  Laterality: N/A;   APPENDECTOMY     BACK SURGERY     cervical fusion and at  tailbone   BLEPHAROPLASTY Bilateral    BREAST BIOPSY Left 05/10/2024   Stereo bx, X clip, path pending   BREAST BIOPSY Left 05/10/2024   MM LT BREAST BX W LOC DEV 1ST LESION IMAGE BX SPEC STEREO GUIDE 05/10/2024 [facility]   BREAST LUMPECTOMY WITH NEEDLE LOCALIZATION N/A 07/03/2022   Procedure: BREAST LUMPECTOMY WITH NEEDLE LOCALIZATION;  Surgeon: Dessa Reyes ORN, MD;  Location: [facility];  Service: General;  Laterality: N/A;   CARDIAC CATHETERIZATION     X 3   CATARACT EXTRACTION, BILATERAL     CHOLECYSTECTOMY     COLONOSCOPY W/ POLYPECTOMY     COLONOSCOPY WITH ESOPHAGOGASTRODUODENOSCOPY (EGD)     COLONOSCOPY WITH PROPOFOL  N/A 08/30/2023   Procedure: COLONOSCOPY WITH PROPOFOL ;  Surgeon: Maryruth Ole DASEN, MD;  Location: [facility];  Service: Endoscopy;  Laterality: N/A;   FOOT SURGERY Bilateral    KYPHOPLASTY     LAPAROSCOPY     OOPHERECTOMY   LEFT HEART CATH AND CORONARY ANGIOGRAPHY Left 01/03/2013   Procedure: LEFT HEART CATH AND CORONARY ANGIOGRAPHY; Location: [facility]; Surgeon: Oneil Hibbs, MD   ORIF WRIST FRACTURE Left 11/18/2020   Procedure: OPEN REDUCTION INTERNAL FIXATION OF LEFT DISTAL RADIUS FRACTURE;  Surgeon: Mardee Lynwood SQUIBB, MD;  Location: [facility];  Service: Orthopedics;  Laterality:  Left;   POLYPECTOMY  08/30/2023   Procedure: POLYPECTOMY;  Surgeon: Maryruth Ole DASEN, MD;  Location: [facility] END[facility];  Service: Endoscopy;;   SEPTOPLASTY     SHOULDER ARTHROSCOPY DISTAL CLAVICLE EXCISION AND OPEN ROTATOR CUFF REPAIR Bilateral    X2   TOTAL HIP ARTHROPLASTY Bilateral    Patient Active Problem List   Diagnosis Date Noted   Genetic testing 08/06/2022   Ductal carcinoma in situ (DCIS) of left breast 06/17/2022   Cervical radiculitis 03/13/2020   Stenosis of cervical spine region 03/13/2020   Complex tear of medial meniscus of right knee as current injury 03/04/2020   Chronic pain of both knees 09/19/2019   Insomnia 02/27/2019   Lumbar degenerative disc  disease 08/09/2018   Anxiety 01/19/2018   Controlled substance agreement signed 01/19/2018   H/O bilateral hip replacements 01/04/2018   Synovial plica syndrome of right knee 11/23/2017   Excessive daytime sleepiness 04/14/2017   Fibromyalgia 01/11/2017   Vitamin D deficiency 01/11/2017   Chronic coronary artery disease 01/18/2015   Hyperlipidemia 01/18/2015   Hypertension 01/18/2015   Mitral valve prolapse 01/18/2015   Chalazion 12/05/2013   Dermatochalasis 11/08/2012    PCP: Lavenia Beaver, MD  REFERRING PROVIDER: Lavenia Beaver, MD  REFERRING DIAG: Urge incontinence  THERAPY DIAG:  Urge incontinence  Muscle weakness (generalized)  Abnormal posture  Other abnormalities of gait and mobility  Rationale for Evaluation and Treatment: Rehabilitation  ONSET DATE: 05/01/24 referral date  SUBJECTIVE:                                                                                                                                                                                           SUBJECTIVE STATEMENT: Pt reported she her shoulders have been bothering her, so she got an injection in L shoulder yesterday. She would like to cease some of the exercises that incr. Shoulder pain (hands on counter and bands). Pt wants to join [employer] in [place].   EVAL: URINARY FUNCTION: pt leaks getting out of the car, gets up 0-3x/night (and when she gets up she doesn't make it to the toilet), when she starts leaking she can't hold it. Hx of chronic UTIs. Denied pain with urination. Pt does not typically experience SUI. She used to use a pantyliner but not now.  BOWEL FUNCTION: daily, and takes probiotic. Hx of hemorrhoids but they don't bother pt. Had a colonoscopy recently and a polyp was found and removed.  CORE STABILITY: hx of B THA, hx of fall in 2022 and broke L ankle, hysterectomy at age of 28, cx fusion and back surgery, R shoulder replacement (8 surgeries between both  shoulders),  hx of 29 surgeries. Did have a breast biopsy this month and hx of breast CA two years ago. Hysterectomy was via abdominal cavity. SEXUAL FUNCTION: pt not sexually active. Pt did not have hx of pain with intercourse or OBGYN exam.  Fluid intake: She drinks 1-2 cups of coffee, water throughout the day.   PAIN:  Are you having pain? No 06/27/24 NPRS scale: 0/10 neck pain Slept wrong and caused pain but massage helped.   PRECAUTIONS: None  RED FLAGS: None   WEIGHT BEARING RESTRICTIONS: No  FALLS:  Has patient fallen in last 6 months? No  OCCUPATION:  retired  ACTIVITY LEVEL : tries to walk at [employer] or [employer] with cart (intervals) on most days  PLOF: Independent  PATIENT GOALS: strengthen her core and pelvic floor, so she doesn't leak   PERTINENT HISTORY:  Hx of Breast CA, arthritis, 29 surgeries, GERD, HLD, HTN, fibromyalgia, hiatal hernia, dysrhythmia, skin CA, pulmonary HTN, anemia, hearing impaired, IBS, appendectomy, cholecystectomy, hysterectomy, B TKA, B shoulder arthroscopy Sexual abuse: No  BOWEL MOVEMENT: Pain with bowel movement: No Type of bowel movement:Frequency daily Fully empty rectum: Yes:   Leakage: No Pads: No Fiber supplement/laxative No but does take miralax prn.  URINATION: Pain with urination: No Fully empty bladder: Yes:   Stream: Strong Urgency: Yes  Frequency: every few hours Leakage: Urge to void and Walking to the bathroom Pads: No but used to wear pantyliner  INTERCOURSE:  Not currently sexually active (widowed) but no hx of pain with intercourse  PREGNANCY: pregnancies: 4 Vaginal deliveries 4 Tearing: she had episiotomy Episiotomy Yes  C-section deliveries 0 Currently pregnant No  PROLAPSE: None per urologist per pt   OBJECTIVE:  Note: Objective measures were completed at Evaluation unless otherwise noted.  COGNITION: Overall cognitive status: Within functional limits for tasks  assessed     SENSATION: Light touch: Appears intact except for decr. RLE sensation over THA scar per pt    GAIT: Assistive device utilized: None Comments: decr. Trunk rotation and incr. Lateral sway  POSTURE: increased lumbar lordosis and increased thoracic kyphosis with R shoulder elevated in stance   LUMBARAROM/PROM:   A/PROM A/PROM  eval  Flexion WFL  Extension Limited by approx. 50%  Right lateral flexion Limited by approx. 25%  Left lateral flexion Limited by approx. 25%  Right rotation Limited by approx. 25%  Left rotation Limited by approx. 25%   (Blank rows = not tested)  LOWER EXTREMITY ROM:B hip IR and ER limited (especially IR). All other LE ROM WFL.  Active ROM Right eval Left eval  Hip flexion    Hip extension    Hip abduction    Hip adduction    Hip internal rotation    Hip external rotation    Knee flexion    Knee extension    Ankle dorsiflexion    Ankle plantarflexion    Ankle inversion    Ankle eversion     (Blank rows = not tested)  LOWER EXTREMITY MMT:  MMT Right eval Left eval  Hip flexion 4+ 4+  Hip extension    Hip abduction 3+ 3+  Hip adduction 3+ 3+  Hip internal rotation    Hip external rotation    Knee flexion 4 4  Knee extension 4+ 4+  Ankle dorsiflexion 5 5  Ankle plantarflexion    Ankle inversion    Ankle eversion     (Blank rows = not tested) PALPATION: No TTP over spine in  standing, incr. Postural sway during R and L SLS  9/9: TTP over R glutes/hip rotators, difficulty palpating PFM contraction (externally) even with PT providing cues to coordinate with breath. TTP over B upper traps and rounded shoulders noted. No DR noted.   PELVIC MMT:   MMT eval  Vaginal   Internal Anal Sphincter   External Anal Sphincter   Puborectalis   Diastasis Recti   (Blank rows = not tested)        TONE: Incr. Muscle tension in back musculature    PROLAPSE: No pressure reported.  TODAY'S TREATMENT:                                                                                                                               DATE: 06/27/24     NMR: Access Code: [email or web address] URL: [email or web address] Date: 06/27/2024 Prepared by: Delon Pinal  Exercises - Supine Angels  - 1 x daily - 7 x weekly - 1 sets - 10 reps - Seated Scapular Retraction  - 1 x daily - 3 x weekly - 3 sets - 10 reps NO BAND - Supine Cervical Retraction with Towel  - 1 x daily - 7 x weekly - 1 sets - 5 reps - 5 hold - Clamshell - 1 x daily - 3 x weekly - 3 sets - 10 reps - Sidelying Reverse Clamshell  - 1 x daily - 3 x weekly - 1 sets - 5 reps - Standing Hip Abduction with Counter Support  - 1 x daily - 3 x weekly - 3 sets - 10 reps  Cues and demo for proper technique. S for safety.  No pain reported except during R clams, modified to standing without pain.    SELF CARE: PATIENT EDUCATION:  Education details: PT educated on modifications for HEP to reduce shoulder and neck pain.. Pt was concerned with weight incr. And we discussed going to see a registered dietician and menopause specialist, as weight incr. Can incr. Joint pain.  Person educated: Patient Education method: Explanation, Demonstration, and Handouts Education comprehension: verbalized understanding, returned demonstration, and needs further education  HOME EXERCISE PROGRAM: Not yet established  ASSESSMENT:  CLINICAL IMPRESSION: Skilled session focused on modifying HEP to reduce B shoulder and neck pain, pt with hx of B shoulder and neck surgeries. Progressed to hip strengthening to reduce PFM tension. Quickly progressed to performing IND after min-mod cues and demo. The following impairments continue to be noted: limited ROM, back and neck pain, postural dysfunction, decr. Strength, nocturia, urge UI. Pt would benefit from skilled PT to improve safety and decr. Pain during all ADLs.   OBJECTIVE IMPAIRMENTS: Abnormal gait, decreased balance, decreased  coordination, decreased endurance, decreased mobility, decreased strength, hypomobility, increased fascial restrictions, impaired flexibility, and postural dysfunction.   ACTIVITY LIMITATIONS: sleeping, continence, and locomotion level  PARTICIPATION LIMITATIONS: interpersonal relationship, shopping, community activity, and church  PERSONAL FACTORS: Age, Past/current experiences, and 3+ comorbidities: see above are  also affecting patient's functional outcome.   REHAB POTENTIAL: Good  CLINICAL DECISION MAKING: Stable/uncomplicated  EVALUATION COMPLEXITY: Low   GOALS: Goals reviewed with patient? Yes  SHORT TERM GOALS: Target date: for all STGs: 06/27/24  Pt will be IND in HEP to improve pain, strength, coordination. Baseline: walks most days at [employer] or [employer] store Goal status: INITIAL  2.  Finish exam and write goals as indicated. Baseline:  Goal status: MET  3.  Pt will demo proper toileting posture to fully empty bladder and reduce straining during bowel movement. Baseline: unable to demo Goal status: INITIAL  4.  Pt will demonstrated improved relaxation and contraction of PFM with coordination of breath to reduce urinary leakage to </=three/week. Baseline: frequently after driving car or getting up at night Goal status: INITIAL    LONG TERM GOALS: Target date: for all LTGs: 07/25/24  Pt will demonstrated improved relaxation and contraction of PFM with coordination of breath to reduce urinary leakage to </=once/week. Baseline: frequently after driving car or getting up at night Goal status: INITIAL  2.  Pt will be IND in advanced HEP to improve pain, strength, coordination. Baseline: walks most days at [employer] or [employer] store Goal status: INITIAL   PLAN: check STGs.    PT FREQUENCY: 1x/week  PT DURATION: 8 weeks  PLANNED INTERVENTIONS: 97164- PT Re-evaluation, 97110-Therapeutic exercises, 97530- Therapeutic activity,  97112- Neuromuscular re-education, 97535- Self Care, [id number]- Manual therapy, [phone]- Gait training, [phone] (1-2 muscles), [phone] (3+ muscles)- Dry Needling, Patient/Family education, Balance training, Taping, Joint mobilization, Spinal mobilization, Scar mobilization, Cryotherapy, Moist heat, and Biofeedback    Takila Kronberg L, PT 06/27/2024, 8:51 AM  Delon Pinal, PT,DPT 06/27/24 8:51 AM Phone: [phone] Fax: [phone]

## 2024-06-29 ENCOUNTER — Encounter

## 2024-07-04 ENCOUNTER — Ambulatory Visit: Attending: Internal Medicine

## 2024-07-04 ENCOUNTER — Other Ambulatory Visit: Payer: Self-pay

## 2024-07-04 DIAGNOSIS — R2689 Other abnormalities of gait and mobility: Secondary | ICD-10-CM | POA: Insufficient documentation

## 2024-07-04 DIAGNOSIS — R293 Abnormal posture: Secondary | ICD-10-CM | POA: Insufficient documentation

## 2024-07-04 DIAGNOSIS — M6281 Muscle weakness (generalized): Secondary | ICD-10-CM | POA: Diagnosis present

## 2024-07-04 DIAGNOSIS — N3941 Urge incontinence: Secondary | ICD-10-CM | POA: Insufficient documentation

## 2024-07-04 NOTE — Therapy (Signed)
 OUTPATIENT PHYSICAL THERAPY FEMALE PELVIC TREATMENT   Patient Name: Suzanne Escobar MRN: [id number] DOB:1946/07/05, 78 y.o., female Today's Date: 07/04/2024  END OF SESSION:  PT End of Session - 07/04/24 1142     Visit Number 5    Number of Visits 9    Date for Recertification  07/29/24    Authorization Type Medicare and [employer]    Progress Note Due on Visit 10    PT Start Time 1139   started session early   PT Stop Time 1220    PT Time Calculation (min) 41 min    Activity Tolerance Patient tolerated treatment well    Behavior During Therapy WFL for tasks assessed/performed          Past Medical History:  Diagnosis Date   Anemia    Ankle fracture, left    Arthritis    Complication of anesthesia    a.) delayed emergence with routine colonoscopy; I was given too much medicine. b.) PONV. c.) postoperative delirium; hallucinations.   Coronary artery calcification seen on CT scan    a.) Coronary CTA 11/03/2011: calcium score 0. b.) LHC 01/03/2013: normal coronary anatomy. c.) CTA 04/04/2021: mild coronary artery calcifications.   Dysrhythmia    Endometriosis    Fibromyalgia    GERD (gastroesophageal reflux disease)    Hearing impaired    Hiatal hernia    History of hiatal hernia    HLD (hyperlipidemia)    HTN (hypertension)    IBS (irritable bowel syndrome)    Mitral valve prolapse    Palpitations    Personal history of radiation therapy    PONV (postoperative nausea and vomiting)    Pulmonary HTN (HCC) 08/21/2020   a.) TTE 08/21/2020: mild elevated PASP of 38 mmHg.   Skin cancer    SOB (shortness of breath)    Past Surgical History:  Procedure Laterality Date   ABDOMINAL HYSTERECTOMY     ANTERIOR CERVICAL DECOMP/DISCECTOMY FUSION N/A 12/03/2021   Procedure: C6-7 ANTERIOR CERVICAL DISCECTOMY AND FUSION (GLOBUS HEDRON);  Surgeon: Clois Fret, MD;  Location: [facility];  Service: Neurosurgery;  Laterality: N/A;   APPENDECTOMY     BACK SURGERY      cervical fusion and at tailbone   BLEPHAROPLASTY Bilateral    BREAST BIOPSY Left 05/10/2024   Stereo bx, X clip, path pending   BREAST BIOPSY Left 05/10/2024   MM LT BREAST BX W LOC DEV 1ST LESION IMAGE BX SPEC STEREO GUIDE 05/10/2024 [facility]   BREAST LUMPECTOMY WITH NEEDLE LOCALIZATION N/A 07/03/2022   Procedure: BREAST LUMPECTOMY WITH NEEDLE LOCALIZATION;  Surgeon: Dessa Reyes ORN, MD;  Location: [facility];  Service: General;  Laterality: N/A;   CARDIAC CATHETERIZATION     X 3   CATARACT EXTRACTION, BILATERAL     CHOLECYSTECTOMY     COLONOSCOPY W/ POLYPECTOMY     COLONOSCOPY WITH ESOPHAGOGASTRODUODENOSCOPY (EGD)     COLONOSCOPY WITH PROPOFOL  N/A 08/30/2023   Procedure: COLONOSCOPY WITH PROPOFOL ;  Surgeon: Maryruth Ole DASEN, MD;  Location: [facility];  Service: Endoscopy;  Laterality: N/A;   FOOT SURGERY Bilateral    KYPHOPLASTY     LAPAROSCOPY     OOPHERECTOMY   LEFT HEART CATH AND CORONARY ANGIOGRAPHY Left 01/03/2013   Procedure: LEFT HEART CATH AND CORONARY ANGIOGRAPHY; Location: [facility]; Surgeon: Oneil Hibbs, MD   ORIF WRIST FRACTURE Left 11/18/2020   Procedure: OPEN REDUCTION INTERNAL FIXATION OF LEFT DISTAL RADIUS FRACTURE;  Surgeon: Mardee Lynwood SQUIBB, MD;  Location: [facility];  Service: Orthopedics;  Laterality: Left;   POLYPECTOMY  08/30/2023   Procedure: POLYPECTOMY;  Surgeon: Maryruth Ole DASEN, MD;  Location: [facility];  Service: Endoscopy;;   SEPTOPLASTY     SHOULDER ARTHROSCOPY DISTAL CLAVICLE EXCISION AND OPEN ROTATOR CUFF REPAIR Bilateral    X2   TOTAL HIP ARTHROPLASTY Bilateral    Patient Active Problem List   Diagnosis Date Noted   Genetic testing 08/06/2022   Ductal carcinoma in situ (DCIS) of left breast 06/17/2022   Cervical radiculitis 03/13/2020   Stenosis of cervical spine region 03/13/2020   Complex tear of medial meniscus of right knee as current injury 03/04/2020   Chronic pain of both knees 09/19/2019   Insomnia 02/27/2019    Lumbar degenerative disc disease 08/09/2018   Anxiety 01/19/2018   Controlled substance agreement signed 01/19/2018   H/O bilateral hip replacements 01/04/2018   Synovial plica syndrome of right knee 11/23/2017   Excessive daytime sleepiness 04/14/2017   Fibromyalgia 01/11/2017   Vitamin D deficiency 01/11/2017   Chronic coronary artery disease 01/18/2015   Hyperlipidemia 01/18/2015   Hypertension 01/18/2015   Mitral valve prolapse 01/18/2015   Chalazion 12/05/2013   Dermatochalasis 11/08/2012    PCP: Lavenia Beaver, MD  REFERRING PROVIDER: Lavenia Beaver, MD  REFERRING DIAG: Urge incontinence  THERAPY DIAG:  Urge incontinence  Muscle weakness (generalized)  Abnormal posture  Other abnormalities of gait and mobility  Rationale for Evaluation and Treatment: Rehabilitation  ONSET DATE: 05/01/24 referral date  SUBJECTIVE:                                                                                                                                                                                           SUBJECTIVE STATEMENT: Pt reported she woke up yesterday and felt LBP that wraps around to R side/hip. She has one more month of antibiotic for chronic UTIs. She has been working on her posture.   EVAL: URINARY FUNCTION: pt leaks getting out of the car, gets up 0-3x/night (and when she gets up she doesn't make it to the toilet), when she starts leaking she can't hold it. Hx of chronic UTIs. Denied pain with urination. Pt does not typically experience SUI. She used to use a pantyliner but not now.  BOWEL FUNCTION: daily, and takes probiotic. Hx of hemorrhoids but they don't bother pt. Had a colonoscopy recently and a polyp was found and removed.  CORE STABILITY: hx of B THA, hx of fall in 2022 and broke L ankle, hysterectomy at age of 78, cx fusion and back surgery, R shoulder replacement (8 surgeries between both shoulders), hx of 29 surgeries. Did have a  breast biopsy this  month and hx of breast CA two years ago. Hysterectomy was via abdominal cavity. SEXUAL FUNCTION: pt not sexually active. Pt did not have hx of pain with intercourse or OBGYN exam.  Fluid intake: She drinks 1-2 cups of coffee, water throughout the day.   PAIN:  Are you having pain? Yes 07/04/24 NPRS scale: 5/10 R back pain with movement, 0/10 at rest Believes slept wrong and caused pain.  PRECAUTIONS: None  RED FLAGS: None   WEIGHT BEARING RESTRICTIONS: No  FALLS:  Has patient fallen in last 6 months? No  OCCUPATION:  retired  ACTIVITY LEVEL : tries to walk at [employer] or [employer] with cart (intervals) on most days  PLOF: Independent  PATIENT GOALS: strengthen her core and pelvic floor, so she doesn't leak   PERTINENT HISTORY:  Hx of Breast CA, arthritis, 29 surgeries, GERD, HLD, HTN, fibromyalgia, hiatal hernia, dysrhythmia, skin CA, pulmonary HTN, anemia, hearing impaired, IBS, appendectomy, cholecystectomy, hysterectomy, B TKA, B shoulder arthroscopy Sexual abuse: No  BOWEL MOVEMENT: Pain with bowel movement: No Type of bowel movement:Frequency daily Fully empty rectum: Yes:   Leakage: No Pads: No Fiber supplement/laxative No but does take miralax prn.  URINATION: Pain with urination: No Fully empty bladder: Yes:   Stream: Strong Urgency: Yes  Frequency: every few hours Leakage: Urge to void and Walking to the bathroom Pads: No but used to wear pantyliner  INTERCOURSE:  Not currently sexually active (widowed) but no hx of pain with intercourse  PREGNANCY: pregnancies: 4 Vaginal deliveries 4 Tearing: she had episiotomy Episiotomy Yes  C-section deliveries 0 Currently pregnant No  PROLAPSE: None per urologist per pt   OBJECTIVE:  Note: Objective measures were completed at Evaluation unless otherwise noted.  COGNITION: Overall cognitive status: Within functional limits for tasks assessed     SENSATION: Light touch: Appears intact  except for decr. RLE sensation over THA scar per pt    GAIT: Assistive device utilized: None Comments: decr. Trunk rotation and incr. Lateral sway  POSTURE: increased lumbar lordosis and increased thoracic kyphosis with R shoulder elevated in stance   LUMBARAROM/PROM:   A/PROM A/PROM  eval  Flexion WFL  Extension Limited by approx. 50%  Right lateral flexion Limited by approx. 25%  Left lateral flexion Limited by approx. 25%  Right rotation Limited by approx. 25%  Left rotation Limited by approx. 25%   (Blank rows = not tested)  LOWER EXTREMITY ROM:B hip IR and ER limited (especially IR). All other LE ROM WFL.  Active ROM Right eval Left eval  Hip flexion    Hip extension    Hip abduction    Hip adduction    Hip internal rotation    Hip external rotation    Knee flexion    Knee extension    Ankle dorsiflexion    Ankle plantarflexion    Ankle inversion    Ankle eversion     (Blank rows = not tested)  LOWER EXTREMITY MMT:  MMT Right eval Left eval  Hip flexion 4+ 4+  Hip extension    Hip abduction 3+ 3+  Hip adduction 3+ 3+  Hip internal rotation    Hip external rotation    Knee flexion 4 4  Knee extension 4+ 4+  Ankle dorsiflexion 5 5  Ankle plantarflexion    Ankle inversion    Ankle eversion     (Blank rows = not tested) PALPATION: No TTP over spine in standing, incr. Postural sway during  R and L SLS  9/9: TTP over R glutes/hip rotators, difficulty palpating PFM contraction (externally) even with PT providing cues to coordinate with breath. TTP over B upper traps and rounded shoulders noted. No DR noted.   PELVIC MMT:   MMT eval  Vaginal   Internal Anal Sphincter   External Anal Sphincter   Puborectalis   Diastasis Recti   (Blank rows = not tested)        TONE: Incr. Muscle tension in back musculature    PROLAPSE: No pressure reported.  TODAY'S TREATMENT:                                                                                                                               DATE: 07/04/24   NMR: Access Code: [email or web address] URL: [email or web address] Date: 07/04/2024 Prepared by: Delon Pinal  Exercises - Supine Angels  - 1 x daily - 7 x weekly - 1 sets - 10 reps reviewed - seated Cervical Retraction with Towel  - 1 x daily - 7 x weekly - 1 sets - 5 reps - 5 hold - Clamshell - 1 x daily - 3 x weekly - 3 sets - 10 reps review no band - Sidelying Reverse Clamshell  - 1 x daily - 3 x weekly - 1 sets - 5 reps - Standing Hip Abduction with Counter Support  - 1 x daily - 3 x weekly - 3 sets - 10 reps -dynamic hamstring stretch and TrA activation during supine to sit and sit to stand txfs to reduce back pain.  Cues and demo for proper technique. S for safety.  No incr. In pain.  MANUAL THERAPY: PT performed assessment of paraspinals, QLs, and sacrum/SIJ-hypomobility of sacrum noted and incr. Tension of R paraspinals and QL, PT performed STM with 0/10 pain reported during 50' of gait after manual therapy.    SELF CARE: PATIENT EDUCATION:  Education details: PT educated on goal progress and how to progress or modify HEP as needed based on pain. The importance of TrA activation during all txfs to reduce pain. Person educated: Patient Education method: Explanation, Demonstration, and Handouts Education comprehension: verbalized understanding, returned demonstration, and needs further education  HOME EXERCISE PROGRAM: Not yet established  ASSESSMENT:  CLINICAL IMPRESSION: Skilled session focused on assessing STGs, pt demonstrated progress as she met all STGs. Pt still having intermittent leakage and nocturia. Pt's LBP also intermittent 2/2 core weakness and paraspinal tension. The following impairments continue to be noted: limited ROM, back and neck pain, postural dysfunction, decr. Strength, nocturia, urge UI. Pt would benefit from skilled PT to improve safety and decr. Pain during all ADLs.   OBJECTIVE  IMPAIRMENTS: Abnormal gait, decreased balance, decreased coordination, decreased endurance, decreased mobility, decreased strength, hypomobility, increased fascial restrictions, impaired flexibility, and postural dysfunction.   ACTIVITY LIMITATIONS: sleeping, continence, and locomotion level  PARTICIPATION LIMITATIONS: interpersonal relationship, shopping, community activity, and church  PERSONAL FACTORS: Age, Past/current experiences, and 3+  comorbidities: see above are also affecting patient's functional outcome.   REHAB POTENTIAL: Good  CLINICAL DECISION MAKING: Stable/uncomplicated  EVALUATION COMPLEXITY: Low   GOALS: Goals reviewed with patient? Yes  SHORT TERM GOALS: Target date: for all STGs: 06/27/24  Pt will be IND in HEP to improve pain, strength, coordination. Baseline: walks most days at [employer] or [employer]; 10/7:  Goal status: MET  2.  Finish exam and write goals as indicated. Baseline:  Goal status: MET  3.  Pt will demo proper toileting posture to fully empty bladder and reduce straining during bowel movement. Baseline: unable to demo Goal status: MET  4.  Pt will demonstrated improved relaxation and contraction of PFM with coordination of breath to reduce urinary leakage to </=three/week. Baseline: frequently after driving car or getting up at night. 10/7: pt doesn't really leak now, she does not wear pad: 0-1x/week. Goal status: MET    LONG TERM GOALS: Target date: for all LTGs: 07/25/24  Pt will demonstrated improved relaxation and contraction of PFM with coordination of breath to reduce urinary leakage to </=once/week. Baseline: frequently after driving car or getting up at night Goal status: INITIAL  2.  Pt will be IND in advanced HEP to improve pain, strength, coordination. Baseline: walks most days at [employer] or [employer] Goal status: INITIAL   PLAN: progress core and PFM strength.    PT FREQUENCY:  1x/week  PT DURATION: 8 weeks  PLANNED INTERVENTIONS: 97164- PT Re-evaluation, 97110-Therapeutic exercises, 97530- Therapeutic activity, 97112- Neuromuscular re-education, 97535- Self Care, [id number]- Manual therapy, [phone]- Gait training, [phone] (1-2 muscles), [phone] (3+ muscles)- Dry Needling, Patient/Family education, Balance training, Taping, Joint mobilization, Spinal mobilization, Scar mobilization, Cryotherapy, Moist heat, and Biofeedback    Haedyn Ancrum L, PT 07/04/2024, 11:42 AM  Delon Pinal, PT,DPT 07/04/24 11:42 AM Phone: [phone] Fax: [phone]

## 2024-07-11 ENCOUNTER — Encounter

## 2024-07-11 ENCOUNTER — Other Ambulatory Visit: Payer: Self-pay

## 2024-07-11 ENCOUNTER — Ambulatory Visit

## 2024-07-11 DIAGNOSIS — N3941 Urge incontinence: Secondary | ICD-10-CM | POA: Diagnosis not present

## 2024-07-11 DIAGNOSIS — M6281 Muscle weakness (generalized): Secondary | ICD-10-CM

## 2024-07-11 DIAGNOSIS — R2689 Other abnormalities of gait and mobility: Secondary | ICD-10-CM

## 2024-07-11 DIAGNOSIS — R293 Abnormal posture: Secondary | ICD-10-CM

## 2024-07-11 NOTE — Therapy (Signed)
 OUTPATIENT PHYSICAL THERAPY FEMALE PELVIC TREATMENT   Patient Name: Suzanne Escobar MRN: [id number] DOB:June 23, 1946, 78 y.o., female Today's Date: 07/11/2024  END OF SESSION:  PT End of Session - 07/11/24 1137     Visit Number 6    Number of Visits 9    Date for Recertification  07/29/24    Authorization Type Medicare and [employer]    Progress Note Due on Visit 10    PT Start Time 1137    PT Stop Time 1219    PT Time Calculation (min) 42 min    Activity Tolerance Patient tolerated treatment well    Behavior During Therapy WFL for tasks assessed/performed          Past Medical History:  Diagnosis Date   Anemia    Ankle fracture, left    Arthritis    Complication of anesthesia    a.) delayed emergence with routine colonoscopy; I was given too much medicine. b.) PONV. c.) postoperative delirium; hallucinations.   Coronary artery calcification seen on CT scan    a.) Coronary CTA 11/03/2011: calcium score 0. b.) LHC 01/03/2013: normal coronary anatomy. c.) CTA 04/04/2021: mild coronary artery calcifications.   Dysrhythmia    Endometriosis    Fibromyalgia    GERD (gastroesophageal reflux disease)    Hearing impaired    Hiatal hernia    History of hiatal hernia    HLD (hyperlipidemia)    HTN (hypertension)    IBS (irritable bowel syndrome)    Mitral valve prolapse    Palpitations    Personal history of radiation therapy    PONV (postoperative nausea and vomiting)    Pulmonary HTN (HCC) 08/21/2020   a.) TTE 08/21/2020: mild elevated PASP of 38 mmHg.   Skin cancer    SOB (shortness of breath)    Past Surgical History:  Procedure Laterality Date   ABDOMINAL HYSTERECTOMY     ANTERIOR CERVICAL DECOMP/DISCECTOMY FUSION N/A 12/03/2021   Procedure: C6-7 ANTERIOR CERVICAL DISCECTOMY AND FUSION (GLOBUS HEDRON);  Surgeon: Clois Fret, MD;  Location: [facility];  Service: Neurosurgery;  Laterality: N/A;   APPENDECTOMY     BACK SURGERY     cervical fusion and  at tailbone   BLEPHAROPLASTY Bilateral    BREAST BIOPSY Left 05/10/2024   Stereo bx, X clip, path pending   BREAST BIOPSY Left 05/10/2024   MM LT BREAST BX W LOC DEV 1ST LESION IMAGE BX SPEC STEREO GUIDE 05/10/2024 [facility]   BREAST LUMPECTOMY WITH NEEDLE LOCALIZATION N/A 07/03/2022   Procedure: BREAST LUMPECTOMY WITH NEEDLE LOCALIZATION;  Surgeon: Dessa Reyes ORN, MD;  Location: [facility];  Service: General;  Laterality: N/A;   CARDIAC CATHETERIZATION     X 3   CATARACT EXTRACTION, BILATERAL     CHOLECYSTECTOMY     COLONOSCOPY W/ POLYPECTOMY     COLONOSCOPY WITH ESOPHAGOGASTRODUODENOSCOPY (EGD)     COLONOSCOPY WITH PROPOFOL  N/A 08/30/2023   Procedure: COLONOSCOPY WITH PROPOFOL ;  Surgeon: Maryruth Ole DASEN, MD;  Location: [facility];  Service: Endoscopy;  Laterality: N/A;   FOOT SURGERY Bilateral    KYPHOPLASTY     LAPAROSCOPY     OOPHERECTOMY   LEFT HEART CATH AND CORONARY ANGIOGRAPHY Left 01/03/2013   Procedure: LEFT HEART CATH AND CORONARY ANGIOGRAPHY; Location: [facility]; Surgeon: Oneil Hibbs, MD   ORIF WRIST FRACTURE Left 11/18/2020   Procedure: OPEN REDUCTION INTERNAL FIXATION OF LEFT DISTAL RADIUS FRACTURE;  Surgeon: Mardee Lynwood SQUIBB, MD;  Location: [facility];  Service: Orthopedics;  Laterality:  Left;   POLYPECTOMY  08/30/2023   Procedure: POLYPECTOMY;  Surgeon: Maryruth Ole DASEN, MD;  Location: [facility];  Service: Endoscopy;;   SEPTOPLASTY     SHOULDER ARTHROSCOPY DISTAL CLAVICLE EXCISION AND OPEN ROTATOR CUFF REPAIR Bilateral    X2   TOTAL HIP ARTHROPLASTY Bilateral    Patient Active Problem List   Diagnosis Date Noted   Genetic testing 08/06/2022   Ductal carcinoma in situ (DCIS) of left breast 06/17/2022   Cervical radiculitis 03/13/2020   Stenosis of cervical spine region 03/13/2020   Complex tear of medial meniscus of right knee as current injury 03/04/2020   Chronic pain of both knees 09/19/2019   Insomnia 02/27/2019   Lumbar degenerative disc  disease 08/09/2018   Anxiety 01/19/2018   Controlled substance agreement signed 01/19/2018   H/O bilateral hip replacements 01/04/2018   Synovial plica syndrome of right knee 11/23/2017   Excessive daytime sleepiness 04/14/2017   Fibromyalgia 01/11/2017   Vitamin D deficiency 01/11/2017   Chronic coronary artery disease 01/18/2015   Hyperlipidemia 01/18/2015   Hypertension 01/18/2015   Mitral valve prolapse 01/18/2015   Chalazion 12/05/2013   Dermatochalasis 11/08/2012    PCP: Lavenia Beaver, MD  REFERRING PROVIDER: Lavenia Beaver, MD  REFERRING DIAG: Urge incontinence  THERAPY DIAG:  Urge incontinence  Muscle weakness (generalized)  Abnormal posture  Other abnormalities of gait and mobility  Rationale for Evaluation and Treatment: Rehabilitation  ONSET DATE: 05/01/24 referral date  SUBJECTIVE:                                                                                                                                                                                           SUBJECTIVE STATEMENT: Pt reported she was gone over the weekend as her granddaughter got married in [place]. Her back is feeling better this week.  Pt walked three miles yesterday.   EVAL: URINARY FUNCTION: pt leaks getting out of the car, gets up 0-3x/night (and when she gets up she doesn't make it to the toilet), when she starts leaking she can't hold it. Hx of chronic UTIs. Denied pain with urination. Pt does not typically experience SUI. She used to use a pantyliner but not now.  BOWEL FUNCTION: daily, and takes probiotic. Hx of hemorrhoids but they don't bother pt. Had a colonoscopy recently and a polyp was found and removed.  CORE STABILITY: hx of B THA, hx of fall in 2022 and broke L ankle, hysterectomy at age of 82, cx fusion and back surgery, R shoulder replacement (8 surgeries between both shoulders), hx of 29 surgeries. Did have a breast biopsy this month and hx of breast  CA two  years ago. Hysterectomy was via abdominal cavity. SEXUAL FUNCTION: pt not sexually active. Pt did not have hx of pain with intercourse or OBGYN exam.  Fluid intake: She drinks 1-2 cups of coffee, water throughout the day.   PAIN:  Are you having pain? No 07/11/24 NPRS scale: 0/10   PRECAUTIONS: None  RED FLAGS: None   WEIGHT BEARING RESTRICTIONS: No  FALLS:  Has patient fallen in last 6 months? No  OCCUPATION:  retired  ACTIVITY LEVEL : tries to walk at [employer] or [employer] with cart (intervals) on most days  PLOF: Independent  PATIENT GOALS: strengthen her core and pelvic floor, so she doesn't leak   PERTINENT HISTORY:  Hx of Breast CA, arthritis, 29 surgeries, GERD, HLD, HTN, fibromyalgia, hiatal hernia, dysrhythmia, skin CA, pulmonary HTN, anemia, hearing impaired, IBS, appendectomy, cholecystectomy, hysterectomy, B TKA, B shoulder arthroscopy Sexual abuse: No  BOWEL MOVEMENT: Pain with bowel movement: No Type of bowel movement:Frequency daily Fully empty rectum: Yes:   Leakage: No Pads: No Fiber supplement/laxative No but does take miralax prn.  URINATION: Pain with urination: No Fully empty bladder: Yes:   Stream: Strong Urgency: Yes  Frequency: every few hours Leakage: Urge to void and Walking to the bathroom Pads: No but used to wear pantyliner  INTERCOURSE:  Not currently sexually active (widowed) but no hx of pain with intercourse  PREGNANCY: pregnancies: 4 Vaginal deliveries 4 Tearing: she had episiotomy Episiotomy Yes  C-section deliveries 0 Currently pregnant No  PROLAPSE: None per urologist per pt   OBJECTIVE:  Note: Objective measures were completed at Evaluation unless otherwise noted.  COGNITION: Overall cognitive status: Within functional limits for tasks assessed     SENSATION: Light touch: Appears intact except for decr. RLE sensation over THA scar per pt    GAIT: Assistive device utilized: None Comments:  decr. Trunk rotation and incr. Lateral sway  POSTURE: increased lumbar lordosis and increased thoracic kyphosis with R shoulder elevated in stance   LUMBARAROM/PROM:   A/PROM A/PROM  eval  Flexion WFL  Extension Limited by approx. 50%  Right lateral flexion Limited by approx. 25%  Left lateral flexion Limited by approx. 25%  Right rotation Limited by approx. 25%  Left rotation Limited by approx. 25%   (Blank rows = not tested)  LOWER EXTREMITY ROM:B hip IR and ER limited (especially IR). All other LE ROM WFL.  Active ROM Right eval Left eval  Hip flexion    Hip extension    Hip abduction    Hip adduction    Hip internal rotation    Hip external rotation    Knee flexion    Knee extension    Ankle dorsiflexion    Ankle plantarflexion    Ankle inversion    Ankle eversion     (Blank rows = not tested)  LOWER EXTREMITY MMT:  MMT Right eval Left eval  Hip flexion 4+ 4+  Hip extension    Hip abduction 3+ 3+  Hip adduction 3+ 3+  Hip internal rotation    Hip external rotation    Knee flexion 4 4  Knee extension 4+ 4+  Ankle dorsiflexion 5 5  Ankle plantarflexion    Ankle inversion    Ankle eversion     (Blank rows = not tested) PALPATION: No TTP over spine in standing, incr. Postural sway during R and L SLS  9/9: TTP over R glutes/hip rotators, difficulty palpating PFM contraction (externally) even with PT providing cues  to coordinate with breath. TTP over B upper traps and rounded shoulders noted. No DR noted.   PELVIC MMT:   MMT eval  Vaginal   Internal Anal Sphincter   External Anal Sphincter   Puborectalis   Diastasis Recti   (Blank rows = not tested)        TONE: Incr. Muscle tension in back musculature    PROLAPSE: No pressure reported.  TODAY'S TREATMENT:                                                                                                                              DATE: 07/11/24   THEREX: Access Code: [email or web address] URL:  [email or web address] Date: 07/11/2024 Prepared by: Delon Pinal  Exercises - Seated Scapular Retraction  - 1 x daily - 3 x weekly - 3 sets - 10 reps NO BAND and then shoulder abd. With yellow band in seated position. - seated Cervical Retraction- 1 x daily - 7 x weekly - 1 sets - 5 reps - 5 hold - Standing Hip Abduction with Counter Support  - 1 x daily - 3 x weekly - 3 sets - 10 reps with yellow band - Dead Bug  - 1 x daily - 3 x weekly - 4 sets - 5 reps, feet on mat - Standing Hip Flexion March  - 1 x daily - 3 x weekly - 2 sets - 5-10 reps (in between hip abd) Cues and demo for proper technique. S for safety, and standing next to counter with UE support. Pt reported mild LBP after hip abd, which decr. With hip marches in standing and TrA activation during hip abd. Cues and demo for proper technique. S for safety.  No incr. In pain.    SELF CARE: PATIENT EDUCATION:  Education details: PT educated on progressed HEP to strengthen LEs, hips and core to reduce low back pain and tension on PFM. Person educated: Patient Education method: Explanation, Demonstration, and Handouts Education comprehension: verbalized understanding, returned demonstration, and needs further education  HOME EXERCISE PROGRAM: [email or web address]  ASSESSMENT:  CLINICAL IMPRESSION: Skilled session focused on progressing strengthening, as pt reported HEP is becoming easy and she was able to walk three miles yesterday without incr. Pain. Pt required cues for TrA activation to protect her back during core and standing exercises, quickly progressed to performing new HEP IND. The following impairments continue to be noted: limited ROM, back and neck pain, postural dysfunction, decr. Strength, nocturia, urge UI. Pt would benefit from skilled PT to improve safety and decr. Pain during all ADLs.   OBJECTIVE IMPAIRMENTS: Abnormal gait, decreased balance, decreased coordination, decreased endurance, decreased  mobility, decreased strength, hypomobility, increased fascial restrictions, impaired flexibility, and postural dysfunction.   ACTIVITY LIMITATIONS: sleeping, continence, and locomotion level  PARTICIPATION LIMITATIONS: interpersonal relationship, shopping, community activity, and church  PERSONAL FACTORS: Age, Past/current experiences, and 3+ comorbidities: see above are also affecting patient's functional outcome.   REHAB POTENTIAL: Good  CLINICAL  DECISION MAKING: Stable/uncomplicated  EVALUATION COMPLEXITY: Low   GOALS: Goals reviewed with patient? Yes  SHORT TERM GOALS: Target date: for all STGs: 06/27/24  Pt will be IND in HEP to improve pain, strength, coordination. Baseline: walks most days at [employer] or [employer]; 10/7:  Goal status: MET  2.  Finish exam and write goals as indicated. Baseline:  Goal status: MET  3.  Pt will demo proper toileting posture to fully empty bladder and reduce straining during bowel movement. Baseline: unable to demo Goal status: MET  4.  Pt will demonstrated improved relaxation and contraction of PFM with coordination of breath to reduce urinary leakage to </=three/week. Baseline: frequently after driving car or getting up at night. 10/7: pt doesn't really leak now, she does not wear pad: 0-1x/week. Goal status: MET    LONG TERM GOALS: Target date: for all LTGs: 07/25/24  Pt will demonstrated improved relaxation and contraction of PFM with coordination of breath to reduce urinary leakage to </=once/week. Baseline: frequently after driving car or getting up at night Goal status: INITIAL  2.  Pt will be IND in advanced HEP to improve pain, strength, coordination. Baseline: walks most days at [employer] or [employer] Goal status: INITIAL   PLAN: continue to progress core, back, hips, and LE strengthening and PFM strength.    PT FREQUENCY: 1x/week  PT DURATION: 8 weeks  PLANNED INTERVENTIONS:  97164- PT Re-evaluation, 97110-Therapeutic exercises, 97530- Therapeutic activity, 97112- Neuromuscular re-education, 97535- Self Care, [id number]- Manual therapy, [id number]- Gait training, [id number] (1-2 muscles), 20561 (3+ muscles)- Dry Needling, Patient/Family education, Balance training, Taping, Joint mobilization, Spinal mobilization, Scar mobilization, Cryotherapy, Moist heat, and Biofeedback    Evaleen Sant L, PT 07/11/2024, 11:38 AM  Delon Pinal, PT,DPT 07/11/24 11:38 AM Phone: [phone] Fax: [phone]

## 2024-07-13 ENCOUNTER — Encounter

## 2024-07-18 ENCOUNTER — Encounter

## 2024-07-20 ENCOUNTER — Ambulatory Visit

## 2024-07-20 ENCOUNTER — Encounter

## 2024-07-20 ENCOUNTER — Other Ambulatory Visit: Payer: Self-pay

## 2024-07-20 DIAGNOSIS — R2689 Other abnormalities of gait and mobility: Secondary | ICD-10-CM

## 2024-07-20 DIAGNOSIS — N3941 Urge incontinence: Secondary | ICD-10-CM

## 2024-07-20 DIAGNOSIS — M6281 Muscle weakness (generalized): Secondary | ICD-10-CM

## 2024-07-20 DIAGNOSIS — R293 Abnormal posture: Secondary | ICD-10-CM

## 2024-07-20 NOTE — Therapy (Addendum)
 OUTPATIENT PHYSICAL THERAPY FEMALE PELVIC TREATMENT   Patient Name: Suzanne Escobar MRN: [id number] DOB:[birth year], 78 y.o., female Today's Date: 07/20/2024  END OF SESSION:  PT End of Session - 07/20/24 1453     Visit Number 7    Number of Visits 9    Date for Recertification  07/29/24    Authorization Type Medicare and [employer]    Progress Note Due on Visit 10    PT Start Time 1450    PT Stop Time 1534    PT Time Calculation (min) 44 min    Activity Tolerance Patient tolerated treatment well    Behavior During Therapy WFL for tasks assessed/performed          Past Medical History:  Diagnosis Date   Anemia    Ankle fracture, left    Arthritis    Complication of anesthesia    a.) delayed emergence with routine colonoscopy; I was given too much medicine. b.) PONV. c.) postoperative delirium; hallucinations.   Coronary artery calcification seen on CT scan    a.) Coronary CTA 11/03/2011: calcium score 0. b.) LHC 01/03/2013: normal coronary anatomy. c.) CTA 04/04/2021: mild coronary artery calcifications.   Dysrhythmia    Endometriosis    Fibromyalgia    GERD (gastroesophageal reflux disease)    Hearing impaired    Hiatal hernia    History of hiatal hernia    HLD (hyperlipidemia)    HTN (hypertension)    IBS (irritable bowel syndrome)    Mitral valve prolapse    Palpitations    Personal history of radiation therapy    PONV (postoperative nausea and vomiting)    Pulmonary HTN (HCC) 08/21/2020   a.) TTE 08/21/2020: mild elevated PASP of 38 mmHg.   Skin cancer    SOB (shortness of breath)    Past Surgical History:  Procedure Laterality Date   ABDOMINAL HYSTERECTOMY     ANTERIOR CERVICAL DECOMP/DISCECTOMY FUSION N/A 12/03/2021   Procedure: C6-7 ANTERIOR CERVICAL DISCECTOMY AND FUSION (GLOBUS HEDRON);  Surgeon: Clois Fret, MD;  Location: [facility];  Service: Neurosurgery;  Laterality: N/A;   APPENDECTOMY     BACK SURGERY     cervical fusion and  at tailbone   BLEPHAROPLASTY Bilateral    BREAST BIOPSY Left 05/10/2024   Stereo bx, X clip, path pending   BREAST BIOPSY Left 05/10/2024   MM LT BREAST BX W LOC DEV 1ST LESION IMAGE BX SPEC STEREO GUIDE 05/10/2024 [facility]   BREAST LUMPECTOMY WITH NEEDLE LOCALIZATION N/A 07/03/2022   Procedure: BREAST LUMPECTOMY WITH NEEDLE LOCALIZATION;  Surgeon: Dessa Reyes ORN, MD;  Location: [facility];  Service: General;  Laterality: N/A;   CARDIAC CATHETERIZATION     X 3   CATARACT EXTRACTION, BILATERAL     CHOLECYSTECTOMY     COLONOSCOPY W/ POLYPECTOMY     COLONOSCOPY WITH ESOPHAGOGASTRODUODENOSCOPY (EGD)     COLONOSCOPY WITH PROPOFOL  N/A 08/30/2023   Procedure: COLONOSCOPY WITH PROPOFOL ;  Surgeon: Maryruth Ole DASEN, MD;  Location: [facility];  Service: Endoscopy;  Laterality: N/A;   FOOT SURGERY Bilateral    KYPHOPLASTY     LAPAROSCOPY     OOPHERECTOMY   LEFT HEART CATH AND CORONARY ANGIOGRAPHY Left 01/03/2013   Procedure: LEFT HEART CATH AND CORONARY ANGIOGRAPHY; Location: [facility]; Surgeon: Oneil Hibbs, MD   ORIF WRIST FRACTURE Left 11/18/2020   Procedure: OPEN REDUCTION INTERNAL FIXATION OF LEFT DISTAL RADIUS FRACTURE;  Surgeon: Mardee Lynwood SQUIBB, MD;  Location: [facility];  Service: Orthopedics;  Laterality:  Left;   POLYPECTOMY  08/30/2023   Procedure: POLYPECTOMY;  Surgeon: Maryruth Ole DASEN, MD;  Location: [facility];  Service: Endoscopy;;   SEPTOPLASTY     SHOULDER ARTHROSCOPY DISTAL CLAVICLE EXCISION AND OPEN ROTATOR CUFF REPAIR Bilateral    X2   TOTAL HIP ARTHROPLASTY Bilateral    Patient Active Problem List   Diagnosis Date Noted   Genetic testing 08/06/2022   Ductal carcinoma in situ (DCIS) of left breast 06/17/2022   Cervical radiculitis 03/13/2020   Stenosis of cervical spine region 03/13/2020   Complex tear of medial meniscus of right knee as current injury 03/04/2020   Chronic pain of both knees 09/19/2019   Insomnia 02/27/2019   Lumbar degenerative disc  disease 08/09/2018   Anxiety 01/19/2018   Controlled substance agreement signed 01/19/2018   H/O bilateral hip replacements 01/04/2018   Synovial plica syndrome of right knee 11/23/2017   Excessive daytime sleepiness 04/14/2017   Fibromyalgia 01/11/2017   Vitamin D deficiency 01/11/2017   Chronic coronary artery disease 01/18/2015   Hyperlipidemia 01/18/2015   Hypertension 01/18/2015   Mitral valve prolapse 01/18/2015   Chalazion 12/05/2013   Dermatochalasis 11/08/2012    PCP: Lavenia Beaver, MD  REFERRING PROVIDER: Lavenia Beaver, MD  REFERRING DIAG: Urge incontinence  THERAPY DIAG:  Urge incontinence  Muscle weakness (generalized)  Abnormal posture  Other abnormalities of gait and mobility  Rationale for Evaluation and Treatment: Rehabilitation  ONSET DATE: 05/01/24 referral date  SUBJECTIVE:                                                                                                                                                                                           SUBJECTIVE STATEMENT: Pt reported she had bad indigestion last night and it was worrisome to pt. Pt states she sometimes has coffee when she wakes up at night. However, she feels like she's not sleeping well. She has an appt. Next month and she will discuss fatigue and energy level with PCP. She feels very fatigued today. She's also having trouble with her dry eyes.    EVAL: URINARY FUNCTION: pt leaks getting out of the car, gets up 0-3x/night (and when she gets up she doesn't make it to the toilet), when she starts leaking she can't hold it. Hx of chronic UTIs. Denied pain with urination. Pt does not typically experience SUI. She used to use a pantyliner but not now.  BOWEL FUNCTION: daily, and takes probiotic. Hx of hemorrhoids but they don't bother pt. Had a colonoscopy recently and a polyp was found and removed.  CORE STABILITY: hx of B THA, hx of fall in 2022 and  broke L ankle, hysterectomy  at age of 52, cx fusion and back surgery, R shoulder replacement (8 surgeries between both shoulders), hx of 29 surgeries. Did have a breast biopsy this month and hx of breast CA two years ago. Hysterectomy was via abdominal cavity. SEXUAL FUNCTION: pt not sexually active. Pt did not have hx of pain with intercourse or OBGYN exam.  Fluid intake: She drinks 1-2 cups of coffee, water throughout the day.   PAIN:  Are you having pain? No 07/20/24 NPRS scale: 0/10    PRECAUTIONS: None  RED FLAGS: None   WEIGHT BEARING RESTRICTIONS: No  FALLS:  Has patient fallen in last 6 months? No  OCCUPATION:  retired  ACTIVITY LEVEL : tries to walk at [employer] or [employer] with cart (intervals) on most days  PLOF: Independent  PATIENT GOALS: strengthen her core and pelvic floor, so she doesn't leak   PERTINENT HISTORY:  Hx of Breast CA, arthritis, 29 surgeries, GERD, HLD, HTN, fibromyalgia, hiatal hernia, dysrhythmia, skin CA, pulmonary HTN, anemia, hearing impaired, IBS, appendectomy, cholecystectomy, hysterectomy, B TKA, B shoulder arthroscopy Sexual abuse: No  BOWEL MOVEMENT: Pain with bowel movement: No Type of bowel movement:Frequency daily Fully empty rectum: Yes:   Leakage: No Pads: No Fiber supplement/laxative No but does take miralax prn.  URINATION: Pain with urination: No Fully empty bladder: Yes:   Stream: Strong Urgency: Yes  Frequency: every few hours Leakage: Urge to void and Walking to the bathroom Pads: No but used to wear pantyliner  INTERCOURSE:  Not currently sexually active (widowed) but no hx of pain with intercourse  PREGNANCY: pregnancies: 4 Vaginal deliveries 4 Tearing: she had episiotomy Episiotomy Yes  C-section deliveries 0 Currently pregnant No  PROLAPSE: None per urologist per pt   OBJECTIVE:  Note: Objective measures were completed at Evaluation unless otherwise noted.  COGNITION: Overall cognitive status: Within  functional limits for tasks assessed     SENSATION: Light touch: Appears intact except for decr. RLE sensation over THA scar per pt    GAIT: Assistive device utilized: None Comments: decr. Trunk rotation and incr. Lateral sway  POSTURE: increased lumbar lordosis and increased thoracic kyphosis with R shoulder elevated in stance   LUMBARAROM/PROM:   A/PROM A/PROM  eval  Flexion WFL  Extension Limited by approx. 50%  Right lateral flexion Limited by approx. 25%  Left lateral flexion Limited by approx. 25%  Right rotation Limited by approx. 25%  Left rotation Limited by approx. 25%   (Blank rows = not tested)  LOWER EXTREMITY ROM:B hip IR and ER limited (especially IR). All other LE ROM WFL.  Active ROM Right eval Left eval  Hip flexion    Hip extension    Hip abduction    Hip adduction    Hip internal rotation    Hip external rotation    Knee flexion    Knee extension    Ankle dorsiflexion    Ankle plantarflexion    Ankle inversion    Ankle eversion     (Blank rows = not tested)  LOWER EXTREMITY MMT:  MMT Right eval Left eval  Hip flexion 4+ 4+  Hip extension    Hip abduction 3+ 3+  Hip adduction 3+ 3+  Hip internal rotation    Hip external rotation    Knee flexion 4 4  Knee extension 4+ 4+  Ankle dorsiflexion 5 5  Ankle plantarflexion    Ankle inversion    Ankle eversion     (  Blank rows = not tested) PALPATION: No TTP over spine in standing, incr. Postural sway during R and L SLS  9/9: TTP over R glutes/hip rotators, difficulty palpating PFM contraction (externally) even with PT providing cues to coordinate with breath. TTP over B upper traps and rounded shoulders noted. No DR noted.   PELVIC MMT:   MMT eval  Vaginal   Internal Anal Sphincter   External Anal Sphincter   Puborectalis   Diastasis Recti   (Blank rows = not tested)        TONE: Incr. Muscle tension in back musculature    PROLAPSE: No pressure reported.  TODAY'S  TREATMENT:                                                                                                                              DATE: 07/20/24   THEREX: Access Code: [email or web address] URL: [email or web address] Date: 07/20/2024 Prepared by: Delon Pinal  Exercises - Seated Scapular Retraction  - 1 x daily - 3 x weekly - 3 sets - 10 reps NO BAND and then shoulder abd. With yellow band in seated position. - seated Cervical Retraction- 1 x daily - 7 x weekly - 1 sets - 5 reps - 5 hold (pain in neck noted after supine PFM contractions likely from tensing entire body) -Supine and seated pelvic floor contraction and relaxation x 10 reps/position and 5 reps x 8 sec. Holds/position. Max cues to decr. Ab/glute max activation. Cues and demo for proper technique. S for safety.     SELF CARE: PATIENT EDUCATION:  Education details: PT educated on the importance of notifying EMS or MD re: heartburn, SOB situation last night and to call 911 if anything suspect happens again. PT encouraged pt to notify MD and bring home BP machine to ensure it is calibrated correctly. PT discussed decr. Frequency to every other week based on leakage progress.  Person educated: Patient Education method: Explanation, Demonstration, and Handouts Education comprehension: verbalized understanding, returned demonstration, and needs further education  HOME EXERCISE PROGRAM: [email or web address]  ASSESSMENT:  CLINICAL IMPRESSION: Skilled session focused on performing PFM contraction and relaxation correctly without compensation of glutes or straining neck and with break coordination. Pt described an indigestion episode last night but stated she was also experienced SOB, PT encouraged pt to take BP if this happens again and to notify MD or call 911 as indicated. The following impairments continue to be noted: limited ROM, back and neck pain, postural dysfunction, decr. Strength, nocturia, urge UI. Pt would benefit from  skilled PT to improve safety and decr. Pain during all ADLs. PT decr. Frequency to every other week based on leakage progress.   OBJECTIVE IMPAIRMENTS: Abnormal gait, decreased balance, decreased coordination, decreased endurance, decreased mobility, decreased strength, hypomobility, increased fascial restrictions, impaired flexibility, and postural dysfunction.   ACTIVITY LIMITATIONS: sleeping, continence, and locomotion level  PARTICIPATION LIMITATIONS: interpersonal relationship, shopping, community activity, and church  PERSONAL FACTORS: Age, Past/current experiences, and  3+ comorbidities: see above are also affecting patient's functional outcome.   REHAB POTENTIAL: Good  CLINICAL DECISION MAKING: Stable/uncomplicated  EVALUATION COMPLEXITY: Low   GOALS: Goals reviewed with patient? Yes  SHORT TERM GOALS: Target date: for all STGs: 06/27/24  Pt will be IND in HEP to improve pain, strength, coordination. Baseline: walks most days at [employer] or [employer]; 10/7:  Goal status: MET  2.  Finish exam and write goals as indicated. Baseline:  Goal status: MET  3.  Pt will demo proper toileting posture to fully empty bladder and reduce straining during bowel movement. Baseline: unable to demo Goal status: MET  4.  Pt will demonstrated improved relaxation and contraction of PFM with coordination of breath to reduce urinary leakage to </=three/week. Baseline: frequently after driving car or getting up at night. 10/7: pt doesn't really leak now, she does not wear pad: 0-1x/week. Goal status: MET    LONG TERM GOALS: Target date: for all LTGs: 07/25/24  Pt will demonstrated improved relaxation and contraction of PFM with coordination of breath to reduce urinary leakage to </=once/week. Baseline: frequently after driving car or getting up at night Goal status: INITIAL  2.  Pt will be IND in advanced HEP to improve pain, strength, coordination. Baseline: walks  most days at [employer] or [employer] Goal status: INITIAL   PLAN: progress PFM strength and continue to progress core, back, hips, and LE strengthening and PFM strength.    PT FREQUENCY: 1x/week  PT DURATION: 8 weeks  PLANNED INTERVENTIONS: 97164- PT Re-evaluation, 97110-Therapeutic exercises, 97530- Therapeutic activity, 97112- Neuromuscular re-education, 97535- Self Care, [id number]- Manual therapy, [phone]- Gait training, [phone] (1-2 muscles), [id number] (3+ muscles)- Dry Needling, Patient/Family education, Balance training, Taping, Joint mobilization, Spinal mobilization, Scar mobilization, Cryotherapy, Moist heat, and Biofeedback    Shey Bartmess L, PT 07/20/2024, 4:24 PM  Delon Pinal, PT,DPT 07/20/24 4:24 PM Phone: [phone] Fax: [phone]

## 2024-07-25 ENCOUNTER — Encounter

## 2024-07-26 ENCOUNTER — Inpatient Hospital Stay: Attending: Oncology | Admitting: Oncology

## 2024-07-26 ENCOUNTER — Encounter: Payer: Self-pay | Admitting: Oncology

## 2024-07-26 VITALS — BP 118/62 | HR 73 | Temp 97.2°F | Resp 19 | Ht 64.0 in | Wt 189.6 lb

## 2024-07-26 DIAGNOSIS — Z86000 Personal history of in-situ neoplasm of breast: Secondary | ICD-10-CM | POA: Insufficient documentation

## 2024-07-26 DIAGNOSIS — Z807 Family history of other malignant neoplasms of lymphoid, hematopoietic and related tissues: Secondary | ICD-10-CM | POA: Diagnosis not present

## 2024-07-26 DIAGNOSIS — Z800 Family history of malignant neoplasm of digestive organs: Secondary | ICD-10-CM | POA: Insufficient documentation

## 2024-07-26 DIAGNOSIS — Z803 Family history of malignant neoplasm of breast: Secondary | ICD-10-CM | POA: Diagnosis not present

## 2024-07-26 DIAGNOSIS — Z08 Encounter for follow-up examination after completed treatment for malignant neoplasm: Secondary | ICD-10-CM | POA: Diagnosis not present

## 2024-07-26 DIAGNOSIS — Z853 Personal history of malignant neoplasm of breast: Secondary | ICD-10-CM | POA: Diagnosis not present

## 2024-07-26 DIAGNOSIS — Z923 Personal history of irradiation: Secondary | ICD-10-CM | POA: Insufficient documentation

## 2024-07-26 DIAGNOSIS — Z8049 Family history of malignant neoplasm of other genital organs: Secondary | ICD-10-CM | POA: Insufficient documentation

## 2024-07-26 NOTE — Progress Notes (Signed)
 Hematology/Oncology Consult note [facility]  Telephone:([phone] [phone]  Patient Care Team: Sherial Bail, MD as PCP - General (Internal Medicine) Darliss Rogue, MD as PCP - Cardiology (Cardiology) Georgina Shasta POUR, RN as Oncology Nurse Navigator Melanee Annah BROCKS, MD as Consulting Physician (Oncology) Rodolph Romano, MD as Consulting Physician (General Surgery) Lenn Aran, MD as Consulting Physician (Radiation Oncology)   Name of the patient: Suzanne Escobar  [id number]  24-Oct-1945   Date of visit: 07/26/24  Diagnosis-left breast DCIS ER positive  Chief complaint/ Reason for visit-routine follow-up of left breast DCIS  Heme/Onc history: patient is a 77 year old female who underwent a routine screening marrow gram in July 2023 which showed suspicious calcifications in her left breast.  This was followed by diagnostic mammogram which showed 10 mm group of calcifications in the upper outer quadrant of the left breast.  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  Patient has met with Dr. Dessa and will be undergoing lumpectomy at some point next month.  She is otherwise doing well for her age.  There is a family history of breast cancer in 2 of her sisters.  Menarche at the age of 18.  She is G3 P3.  Age at first birth 10.  She is not presently on hormone replacement therapy.  No prior history of breast biopsies.   Final pathology showed 16 mm grade 2 DCIS with close   Inferior-anterior margin of 0.5 mm. No evidence of invasive malignancy.  Patient completed adjuvant radiation therapy.  She has not started endocrine therapy yet baseline bone density scan showed osteopenia.  She started taking letrozole  in August 2024 but stopped taking it sometime around May 2025 as she was concerned about weight gain    Interval history-patient states she has stopped taking letrozole  sometime in the last 6 months due to concern for ongoing weight  gain.  She does not wish to try any alternative endocrine therapy at present.  Denies any breast concerns today  ECOG PS- 1 Pain scale- 0   Review of systems- Review of Systems  Constitutional:  Negative for chills, fever, malaise/fatigue and weight loss.  HENT:  Negative for congestion, ear discharge and nosebleeds.   Eyes:  Negative for blurred vision.  Respiratory:  Negative for cough, hemoptysis, sputum production, shortness of breath and wheezing.   Cardiovascular:  Negative for chest pain, palpitations, orthopnea and claudication.  Gastrointestinal:  Negative for abdominal pain, blood in stool, constipation, diarrhea, heartburn, melena, nausea and vomiting.  Genitourinary:  Negative for dysuria, flank pain, frequency, hematuria and urgency.  Musculoskeletal:  Negative for back pain, joint pain and myalgias.  Skin:  Negative for rash.  Neurological:  Negative for dizziness, tingling, focal weakness, seizures, weakness and headaches.  Endo/Heme/Allergies:  Does not bruise/bleed easily.  Psychiatric/Behavioral:  Negative for depression and suicidal ideas. The patient does not have insomnia.       Allergies  Allergen Reactions   Hydrocodone-Acetaminophen  Itching    Can take if she takes benadryl with it.       Past Medical History:  Diagnosis Date   Anemia    Ankle fracture, left    Arthritis    Complication of anesthesia    a.) delayed emergence with routine colonoscopy; I was given too much medicine. b.) PONV. c.) postoperative delirium; hallucinations.   Coronary artery calcification seen on CT scan    a.) Coronary CTA 11/03/2011: calcium score 0. b.) LHC 01/03/2013: normal coronary anatomy. c.) CTA 04/04/2021:  mild coronary artery calcifications.   Dysrhythmia    Endometriosis    Fibromyalgia    GERD (gastroesophageal reflux disease)    Hearing impaired    Hiatal hernia    History of hiatal hernia    HLD (hyperlipidemia)    HTN (hypertension)    IBS (irritable  bowel syndrome)    Mitral valve prolapse    Palpitations    Personal history of radiation therapy    PONV (postoperative nausea and vomiting)    Pulmonary HTN (HCC) 08/21/2020   a.) TTE 08/21/2020: mild elevated PASP of 38 mmHg.   Skin cancer    SOB (shortness of breath)      Past Surgical History:  Procedure Laterality Date   ABDOMINAL HYSTERECTOMY     ANTERIOR CERVICAL DECOMP/DISCECTOMY FUSION N/A 12/03/2021   Procedure: C6-7 ANTERIOR CERVICAL DISCECTOMY AND FUSION (GLOBUS HEDRON);  Surgeon: Clois Fret, MD;  Location: [facility];  Service: Neurosurgery;  Laterality: N/A;   APPENDECTOMY     BACK SURGERY     cervical fusion and at tailbone   BLEPHAROPLASTY Bilateral    BREAST BIOPSY Left 05/10/2024   Stereo bx, X clip, path pending   BREAST BIOPSY Left 05/10/2024   MM LT BREAST BX W LOC DEV 1ST LESION IMAGE BX SPEC STEREO GUIDE 05/10/2024 [facility]   BREAST LUMPECTOMY WITH NEEDLE LOCALIZATION N/A 07/03/2022   Procedure: BREAST LUMPECTOMY WITH NEEDLE LOCALIZATION;  Surgeon: Dessa Reyes ORN, MD;  Location: [facility];  Service: General;  Laterality: N/A;   CARDIAC CATHETERIZATION     X 3   CATARACT EXTRACTION, BILATERAL     CHOLECYSTECTOMY     COLONOSCOPY W/ POLYPECTOMY     COLONOSCOPY WITH ESOPHAGOGASTRODUODENOSCOPY (EGD)     COLONOSCOPY WITH PROPOFOL  N/A 08/30/2023   Procedure: COLONOSCOPY WITH PROPOFOL ;  Surgeon: Maryruth Ole DASEN, MD;  Location: [facility];  Service: Endoscopy;  Laterality: N/A;   FOOT SURGERY Bilateral    KYPHOPLASTY     LAPAROSCOPY     OOPHERECTOMY   LEFT HEART CATH AND CORONARY ANGIOGRAPHY Left 01/03/2013   Procedure: LEFT HEART CATH AND CORONARY ANGIOGRAPHY; Location: [facility]; Surgeon: Oneil Hibbs, MD   ORIF WRIST FRACTURE Left 11/18/2020   Procedure: OPEN REDUCTION INTERNAL FIXATION OF LEFT DISTAL RADIUS FRACTURE;  Surgeon: Mardee Lynwood SQUIBB, MD;  Location: [facility];  Service: Orthopedics;  Laterality: Left;   POLYPECTOMY  08/30/2023    Procedure: POLYPECTOMY;  Surgeon: Maryruth Ole DASEN, MD;  Location: [facility];  Service: Endoscopy;;   SEPTOPLASTY     SHOULDER ARTHROSCOPY DISTAL CLAVICLE EXCISION AND OPEN ROTATOR CUFF REPAIR Bilateral    X2   TOTAL HIP ARTHROPLASTY Bilateral     Social History   Socioeconomic History   Marital status: Widowed    Spouse name: Not on file   Number of children: Not on file   Years of education: Not on file   Highest education level: Not on file  Occupational History   Not on file  Tobacco Use   Smoking status: Never    Passive exposure: Never   Smokeless tobacco: Never  Vaping Use   Vaping status: Never Used  Substance and Sexual Activity   Alcohol use: Not Currently    Comment: occasional wine   Drug use: Never   Sexual activity: Not on file  Other Topics Concern   Not on file  Social History Narrative   Not on file   Social Drivers of Health   Financial Resource Strain: Low Risk  (06/08/2024)  Received from [facility]   Overall Financial Resource Strain (CARDIA)    Difficulty of Paying Living Expenses: Not hard at all  Food Insecurity: No Food Insecurity (06/08/2024)   Received from [facility]   Hunger Vital Sign    Within the past 12 months, you worried that your food would run out before you got the money to buy more.: Never true    Within the past 12 months, the food you bought just didn't last and you didn't have money to get more.: Never true  Transportation Needs: No Transportation Needs (06/08/2024)   Received from [facility] - Transportation    In the past 12 months, has lack of transportation kept you from medical appointments or from getting medications?: No    Lack of Transportation (Non-Medical): No  Physical Activity: Inactive (10/26/2019)   Received from [facility]   Exercise Vital Sign    On average, how many days per week do you engage in moderate to strenuous exercise  (like a brisk walk)?: 0 days    On average, how many minutes do you engage in exercise at this level?: 0 min  Stress: No Stress Concern Present (10/26/2019)   Received from [facility] - Occupational Stress Questionnaire    Feeling of Stress : Not at all  Social Connections: Moderately Integrated (10/26/2019)   Received from [facility]   Social Connection and Isolation Panel    In a typical week, how many times do you talk on the phone with family, friends, or neighbors?: More than three times a week    How often do you get together with friends or relatives?: More than three times a week    How often do you attend church or religious services?: More than 4 times per year    Do you belong to any clubs or organizations such as church groups, unions, fraternal or athletic groups, or school groups?: Yes    How often do you attend meetings of the clubs or organizations you belong to?: More than 4 times per year    Are you married, widowed, divorced, separated, never married, or living with a partner?: Widowed  Intimate Partner Violence: Not At Risk (10/26/2019)   Received from [facility]   Humiliation, Afraid, Rape, and Kick questionnaire    Within the last year, have you been afraid of your partner or ex-partner?: No    Within the last year, have you been humiliated or emotionally abused in other ways by your partner or ex-partner?: No    Within the last year, have you been kicked, hit, slapped, or otherwise physically hurt by your partner or ex-partner?: No    Within the last year, have you been raped or forced to have any kind of sexual activity by your partner or ex-partner?: No    Family History  Problem Relation Age of Onset   Heart attack Father    Heart disease Father    Breast cancer Sister        d. 61s; metastatic   Breast cancer Sister        dx 42   Heart attack Brother    Lymphoma Brother    Breast cancer Maternal Aunt         dx twice   Throat cancer Paternal Aunt    Cervical cancer Paternal Aunt    Breast cancer Cousin  maternal cousin   Cervical cancer Niece      Current Outpatient Medications:    acetaminophen  (TYLENOL ) 500 MG tablet, Take 1,000 mg by mouth every 6 (six) hours as needed., Disp: , Rfl:    amitriptyline (ELAVIL) 50 MG tablet, Take 50 mg by mouth at bedtime., Disp: , Rfl:    cyanocobalamin (VITAMIN B12) 1000 MCG tablet, Take 1,000 mcg by mouth daily., Disp: , Rfl:    magnesium oxide (MAG-OX) 400 MG tablet, Take 400 mg by mouth daily., Disp: , Rfl:    metoprolol  succinate (TOPROL -XL) 25 MG 24 hr tablet, Take 25 mg by mouth every evening., Disp: , Rfl:    nitrofurantoin, macrocrystal-monohydrate, (MACROBID) 100 MG capsule, Take 100 mg by mouth 2 (two) times daily., Disp: , Rfl:    pantoprazole (PROTONIX) 40 MG tablet, Take 40 mg by mouth daily., Disp: , Rfl:    RESTASIS 0.05 % ophthalmic emulsion, Place 1 drop into both eyes 2 (two) times daily., Disp: , Rfl:    rosuvastatin (CRESTOR) 5 MG tablet, Take 5 mg by mouth daily., Disp: , Rfl:    Vitamin D-Vitamin K (K2 PLUS D3 PO), Take 1 tablet by mouth daily., Disp: , Rfl:    furosemide (LASIX) 20 MG tablet, Take 20 mg by mouth daily as needed for edema. (Patient not taking: Reported on 07/26/2024), Disp: , Rfl:    letrozole  (FEMARA ) 2.5 MG tablet, TAKE 1 TABLET(2.5 MG) BY MOUTH DAILY (Patient not taking: Reported on 07/26/2024), Disp: 30 tablet, Rfl: 3   Semaglutide-Weight Management 0.25 MG/0.5ML SOAJ, Inject 0.25 mg into the skin once a week. (Patient not taking: Reported on 07/26/2024), Disp: , Rfl:    trimethoprim  (TRIMPEX ) 100 MG tablet, Take 1 tablet (100 mg total) by mouth daily. (Patient not taking: Reported on 07/26/2024), Disp: 30 tablet, Rfl: 11  Physical exam:  Vitals:   07/26/24 1150  BP: 118/62  Pulse: 73  Resp: 19  Temp: (!) 97.2 F (36.2 C)  TempSrc: Tympanic  SpO2: 99%  Weight: 189 lb 9.6 oz (86 kg)  Height: 5'  4 (1.626 m)   Physical Exam Cardiovascular:     Rate and Rhythm: Normal rate and regular rhythm.     Heart sounds: Normal heart sounds.  Pulmonary:     Effort: Pulmonary effort is normal.     Breath sounds: Normal breath sounds.  Skin:    General: Skin is warm and dry.  Neurological:     Mental Status: She is alert and oriented to person, place, and time.    Breast exam was performed in seated and lying down position. Patient is status post left lumpectomy with a well-healed surgical scar. No evidence of any palpable masses. No evidence of axillary adenopathy. No evidence of any palpable masses or lumps in the right breast. No evidence of right axillary adenopathy   I have personally reviewed labs listed below:    Latest Ref Rng & Units 07/19/2023   11:22 AM  CMP  Glucose 70 - 99 mg/dL 895   BUN 8 - 23 mg/dL 19   Creatinine 9.55 - 1.00 mg/dL 9.37   Sodium 864 - 854 mmol/L 139   Potassium 3.5 - 5.1 mmol/L 4.3   Chloride 98 - 111 mmol/L 105   CO2 22 - 32 mmol/L 26   Calcium 8.9 - 10.3 mg/dL 8.6   Total Protein 6.5 - 8.1 g/dL 6.9   Total Bilirubin 0.3 - 1.2 mg/dL 0.4   Alkaline Phos 38 - 126 U/L  59   AST 15 - 41 U/L 16   ALT 0 - 44 U/L 14       Latest Ref Rng & Units 07/19/2023   11:22 AM  CBC  WBC 4.0 - 10.5 K/uL 6.2   Hemoglobin 12.0 - 15.0 g/dL 87.4   Hematocrit 63.9 - 46.0 % 38.6   Platelets 150 - 400 K/uL 250      Assessment and plan- Patient is a 78 y.o. female with history of left breast DCIS ER positive here for routine follow-up  Clinically patient is doing well with no concerning signs and symptoms of recurrence based on today's exam.  Patient has not taken letrozole  since the last 6 months as she was concerned about weight gain.  Typically weight gain has not shown to be worse with AI's as compared to placebo.  She will let us  know if she changes her mind.  I will see her back in 6 months no labs.  Mammograms to be coordinated by Dr. Cesar   Visit  Diagnosis 1. Encounter for follow-up surveillance of breast cancer      Dr. Annah Skene, MD, MPH [facility] at [facility] [phone] 07/26/2024 1:46 PM

## 2024-07-26 NOTE — Progress Notes (Signed)
 Patient has no new or acute concerns at this time. She is doing well.

## 2024-07-27 ENCOUNTER — Encounter

## 2024-07-27 ENCOUNTER — Ambulatory Visit

## 2024-08-03 ENCOUNTER — Ambulatory Visit

## 2024-08-09 ENCOUNTER — Encounter: Payer: Self-pay | Admitting: Cardiology

## 2024-08-09 ENCOUNTER — Ambulatory Visit: Attending: Cardiology | Admitting: Cardiology

## 2024-08-09 VITALS — BP 130/80 | HR 75 | Ht 65.0 in | Wt 188.8 lb

## 2024-08-09 DIAGNOSIS — I2510 Atherosclerotic heart disease of native coronary artery without angina pectoris: Secondary | ICD-10-CM | POA: Diagnosis not present

## 2024-08-09 DIAGNOSIS — E7800 Pure hypercholesterolemia, unspecified: Secondary | ICD-10-CM | POA: Diagnosis not present

## 2024-08-09 NOTE — Progress Notes (Signed)
 Cardiology Office Note:    Date:  08/09/2024   ID:  Suzanne Escobar, DOB 1946/01/20, MRN [id number]  PCP:  Sherial Bail, MD    [facility] Providers Cardiologist:  Redell Cave, MD     Referring MD: Sherial Bail, MD   Chief Complaint  Patient presents with   Follow-up    12 month follow up. Patient states that she feels sick on today.  Meds reviewed.     History of Present Illness:    Suzanne Escobar is a 78 y.o. female with a hx of nonobstructive CAD (25% proximal LAD stenosis on CCTA 9/24), hyperlipidemia presenting for follow-up.   Doing okay, recently started on Zepbound 2 days ago to help with weight loss.  Endorses some nausea, hoping Zepbound helps with weight loss.  Denies chest pain.  Overall doing okay from a cardiac perspective.   Past Medical History:  Diagnosis Date   Anemia    Ankle fracture, left    Arthritis    Complication of anesthesia    a.) delayed emergence with routine colonoscopy; I was given too much medicine. b.) PONV. c.) postoperative delirium; hallucinations.   Coronary artery calcification seen on CT scan    a.) Coronary CTA 11/03/2011: calcium score 0. b.) LHC 01/03/2013: normal coronary anatomy. c.) CTA 04/04/2021: mild coronary artery calcifications.   Dysrhythmia    Endometriosis    Fibromyalgia    GERD (gastroesophageal reflux disease)    Hearing impaired    Hiatal hernia    History of hiatal hernia    HLD (hyperlipidemia)    HTN (hypertension)    IBS (irritable bowel syndrome)    Mitral valve prolapse    Palpitations    Personal history of radiation therapy    PONV (postoperative nausea and vomiting)    Pulmonary HTN (HCC) 08/21/2020   a.) TTE 08/21/2020: mild elevated PASP of 38 mmHg.   Skin cancer    SOB (shortness of breath)     Past Surgical History:  Procedure Laterality Date   ABDOMINAL HYSTERECTOMY     ANTERIOR CERVICAL DECOMP/DISCECTOMY FUSION N/A 12/03/2021   Procedure: C6-7 ANTERIOR  CERVICAL DISCECTOMY AND FUSION (GLOBUS HEDRON);  Surgeon: Clois Fret, MD;  Location: [facility];  Service: Neurosurgery;  Laterality: N/A;   APPENDECTOMY     BACK SURGERY     cervical fusion and at tailbone   BLEPHAROPLASTY Bilateral    BREAST BIOPSY Left 05/10/2024   Stereo bx, X clip, path pending   BREAST BIOPSY Left 05/10/2024   MM LT BREAST BX W LOC DEV 1ST LESION IMAGE BX SPEC STEREO GUIDE 05/10/2024 [facility]   BREAST LUMPECTOMY WITH NEEDLE LOCALIZATION N/A 07/03/2022   Procedure: BREAST LUMPECTOMY WITH NEEDLE LOCALIZATION;  Surgeon: Dessa Reyes ORN, MD;  Location: [facility];  Service: General;  Laterality: N/A;   CARDIAC CATHETERIZATION     X 3   CATARACT EXTRACTION, BILATERAL     CHOLECYSTECTOMY     COLONOSCOPY W/ POLYPECTOMY     COLONOSCOPY WITH ESOPHAGOGASTRODUODENOSCOPY (EGD)     COLONOSCOPY WITH PROPOFOL  N/A 08/30/2023   Procedure: COLONOSCOPY WITH PROPOFOL ;  Surgeon: Maryruth Ole DASEN, MD;  Location: [facility];  Service: Endoscopy;  Laterality: N/A;   FOOT SURGERY Bilateral    KYPHOPLASTY     LAPAROSCOPY     OOPHERECTOMY   LEFT HEART CATH AND CORONARY ANGIOGRAPHY Left 01/03/2013   Procedure: LEFT HEART CATH AND CORONARY ANGIOGRAPHY; Location: [facility]; Surgeon: Oneil Hibbs, MD   ORIF WRIST FRACTURE Left 11/18/2020  Procedure: OPEN REDUCTION INTERNAL FIXATION OF LEFT DISTAL RADIUS FRACTURE;  Surgeon: Mardee Lynwood SQUIBB, MD;  Location: [facility];  Service: Orthopedics;  Laterality: Left;   POLYPECTOMY  08/30/2023   Procedure: POLYPECTOMY;  Surgeon: Maryruth Ole DASEN, MD;  Location: [facility];  Service: Endoscopy;;   SEPTOPLASTY     SHOULDER ARTHROSCOPY DISTAL CLAVICLE EXCISION AND OPEN ROTATOR CUFF REPAIR Bilateral    X2   TOTAL HIP ARTHROPLASTY Bilateral     Current Medications: Current Meds  Medication Sig   acetaminophen  (TYLENOL ) 500 MG tablet Take 1,000 mg by mouth every 6 (six) hours as needed.   amitriptyline (ELAVIL) 50 MG tablet Take  50 mg by mouth at bedtime.   cyanocobalamin (VITAMIN B12) 1000 MCG tablet Take 1,000 mcg by mouth daily.   letrozole  (FEMARA ) 2.5 MG tablet TAKE 1 TABLET(2.5 MG) BY MOUTH DAILY   magnesium oxide (MAG-OX) 400 MG tablet Take 400 mg by mouth daily.   metoprolol  succinate (TOPROL -XL) 25 MG 24 hr tablet Take 25 mg by mouth every evening.   nitrofurantoin, macrocrystal-monohydrate, (MACROBID) 100 MG capsule Take 100 mg by mouth 2 (two) times daily.   pantoprazole (PROTONIX) 40 MG tablet Take 40 mg by mouth daily.   RESTASIS 0.05 % ophthalmic emulsion Place 1 drop into both eyes 2 (two) times daily.   rosuvastatin (CRESTOR) 5 MG tablet Take 5 mg by mouth daily.   tirzepatide (ZEPBOUND) 2.5 MG/0.5ML injection vial Inject 2.5 mg into the skin once a week.   Vitamin D-Vitamin K (K2 PLUS D3 PO) Take 1 tablet by mouth daily.     Allergies:   Hydrocodone-acetaminophen    Social History   Socioeconomic History   Marital status: Widowed    Spouse name: Not on file   Number of children: Not on file   Years of education: Not on file   Highest education level: Not on file  Occupational History   Not on file  Tobacco Use   Smoking status: Never    Passive exposure: Never   Smokeless tobacco: Never  Vaping Use   Vaping status: Never Used  Substance and Sexual Activity   Alcohol use: Not Currently    Comment: occasional wine   Drug use: Never   Sexual activity: Not on file  Other Topics Concern   Not on file  Social History Narrative   Not on file   Social Drivers of Health   Financial Resource Strain: Low Risk  (08/03/2024)   Received from [facility]   Overall Financial Resource Strain (CARDIA)    Difficulty of Paying Living Expenses: Not hard at all  Food Insecurity: No Food Insecurity (08/03/2024)   Received from [facility]   Hunger Vital Sign    Within the past 12 months, you worried that your food would run out before you got the money to buy  more.: Never true    Within the past 12 months, the food you bought just didn't last and you didn't have money to get more.: Never true  Transportation Needs: No Transportation Needs (08/03/2024)   Received from [facility] - Transportation    In the past 12 months, has lack of transportation kept you from medical appointments or from getting medications?: No    Lack of Transportation (Non-Medical): No  Physical Activity: Inactive (10/26/2019)   Received from [facility]   Exercise Vital Sign    On average, how many days per week do you  engage in moderate to strenuous exercise (like a brisk walk)?: 0 days    On average, how many minutes do you engage in exercise at this level?: 0 min  Stress: No Stress Concern Present (10/26/2019)   Received from [facility] - Occupational Stress Questionnaire    Feeling of Stress : Not at all  Social Connections: Moderately Integrated (10/26/2019)   Received from [facility]   Social Connection and Isolation Panel    In a typical week, how many times do you talk on the phone with family, friends, or neighbors?: More than three times a week    How often do you get together with friends or relatives?: More than three times a week    How often do you attend church or religious services?: More than 4 times per year    Do you belong to any clubs or organizations such as church groups, unions, fraternal or athletic groups, or school groups?: Yes    How often do you attend meetings of the clubs or organizations you belong to?: More than 4 times per year    Are you married, widowed, divorced, separated, never married, or living with a partner?: Widowed     Family History: The patient's family history includes Breast cancer in her cousin, maternal aunt, sister, and sister; Cervical cancer in her niece and paternal aunt; Heart attack in her brother and father; Heart disease in her  father; Lymphoma in her brother; Throat cancer in her paternal aunt.  ROS:   Please see the history of present illness.     All other systems reviewed and are negative.  EKGs/Labs/Other Studies Reviewed:    The following studies were reviewed today:  EKG Interpretation Date/Time:  Wednesday August 09 2024 10:17:38 EST Ventricular Rate:  75 PR Interval:  122 QRS Duration:  82 QT Interval:  382 QTC Calculation: 426 R Axis:   17  Text Interpretation: Normal sinus rhythm Low voltage QRS Confirmed by Darliss Rogue ([id number]) on 08/09/2024 10:29:35 AM    Recent Labs: No results found for requested labs within last 365 days.  Recent Lipid Panel No results found for: CHOL, TRIG, HDL, CHOLHDL, VLDL, LDLCALC, LDLDIRECT  Outside lab work, lipid panel 01/2023 total cholesterol 158, triglycerides 61, LDL 76.  Risk Assessment/Calculations:          Physical Exam:    VS:  BP 130/80   Pulse 75   Ht 5' 5 (1.651 m)   Wt 188 lb 12.8 oz (85.6 kg)   SpO2 98%   BMI 31.42 kg/m     Wt Readings from Last 3 Encounters:  08/09/24 188 lb 12.8 oz (85.6 kg)  07/26/24 189 lb 9.6 oz (86 kg)  01/25/24 189 lb 14.4 oz (86.1 kg)     GEN:  Well nourished, well developed in no acute distress HEENT: Normal NECK: No JVD; No carotid bruits CARDIAC: RRR, no murmurs, rubs, gallops RESPIRATORY:  Clear to auscultation without rales, wheezing or rhonchi  ABDOMEN: Soft, non-tender, non-distended MUSCULOSKELETAL:  No edema; No deformity  SKIN: Warm and dry NEUROLOGIC:  Alert and oriented x 3 PSYCHIATRIC:  Normal affect   ASSESSMENT:    1. Coronary artery disease involving native coronary artery of native heart, unspecified whether angina present   2. Pure hypercholesterolemia    PLAN:    In order of problems listed above:  Mild nonobstructive CAD, coronary CT 9/24 mild proximal LAD stenosis 25%.  Calcium score  92.8.  Echo with normal EF 60 to 65%.  Continue Crestor 5 mg daily.    Hyperlipidemia, continue Crestor 5 mg daily.  Follow-up in 1 year       Medication Adjustments/Labs and Tests Ordered: Current medicines are reviewed at length with the patient today.  Concerns regarding medicines are outlined above.  Orders Placed This Encounter  Procedures   EKG 12-Lead   No orders of the defined types were placed in this encounter.   Patient Instructions  Medication Instructions:  Your physician recommends that you continue on your current medications as directed. Please refer to the Current Medication list given to you today.   *If you need a refill on your cardiac medications before your next appointment, please call your pharmacy*  Lab Work: No labs ordered today  If you have labs (blood work) drawn today and your tests are completely normal, you will receive your results only by: MyChart Message (if you have MyChart) OR A paper copy in the mail If you have any lab test that is abnormal or we need to change your treatment, we will call you to review the results.  Testing/Procedures: No test ordered today   Follow-Up: At [facility], you and your health needs are our priority.  As part of our continuing mission to provide you with exceptional heart care, our providers are all part of one team.  This team includes your primary Cardiologist (physician) and Advanced Practice Providers or APPs (Physician Assistants and Nurse Practitioners) who all work together to provide you with the care you need, when you need it.  Your next appointment:   1 year(s)  Provider:   You may see Redell Cave, MD or one of the following Advanced Practice Providers on your designated Care Team:   Lonni Meager, NP Lesley Maffucci, PA-C Bernardino Bring, PA-C Cadence Murray, PA-C Tylene Lunch, NP Barnie Hila, NP    We recommend signing up for the patient portal called MyChart.  Sign up information is provided on this After Visit Summary.  MyChart is used  to connect with patients for Virtual Visits (Telemedicine).  Patients are able to view lab/test results, encounter notes, upcoming appointments, etc.  Non-urgent messages can be sent to your provider as well.   To learn more about what you can do with MyChart, go to [email or web address].          Signed, Redell Cave, MD  08/09/2024 11:27 AM    [facility]

## 2024-08-09 NOTE — Patient Instructions (Signed)

## 2024-08-17 ENCOUNTER — Ambulatory Visit

## 2024-10-17 ENCOUNTER — Other Ambulatory Visit: Payer: Self-pay | Admitting: Physical Medicine & Rehabilitation

## 2024-10-17 ENCOUNTER — Ambulatory Visit
Admission: RE | Admit: 2024-10-17 | Discharge: 2024-10-17 | Disposition: A | Discharge status: Home / Self Care | Source: Ambulatory Visit | Attending: Physical Medicine & Rehabilitation | Admitting: Physical Medicine & Rehabilitation

## 2024-10-17 DIAGNOSIS — M5441 Lumbago with sciatica, right side: Secondary | ICD-10-CM | POA: Diagnosis present

## 2024-10-17 DIAGNOSIS — M5442 Lumbago with sciatica, left side: Secondary | ICD-10-CM | POA: Diagnosis present

## 2024-11-01 ENCOUNTER — Other Ambulatory Visit: Payer: Self-pay | Admitting: Internal Medicine

## 2024-11-01 DIAGNOSIS — M439 Deforming dorsopathy, unspecified: Secondary | ICD-10-CM

## 2024-11-02 ENCOUNTER — Inpatient Hospital Stay
Admission: RE | Admit: 2024-11-02 | Discharge: 2024-11-02 | Disposition: A | Source: Ambulatory Visit | Attending: Internal Medicine | Admitting: Internal Medicine

## 2024-11-02 ENCOUNTER — Other Ambulatory Visit (HOSPITAL_COMMUNITY): Payer: Self-pay | Admitting: Interventional Radiology

## 2024-11-02 VITALS — BP 124/96 | HR 76 | Temp 97.9°F | Resp 16 | Wt 176.0 lb

## 2024-11-02 DIAGNOSIS — M439 Deforming dorsopathy, unspecified: Secondary | ICD-10-CM

## 2024-11-02 DIAGNOSIS — I10 Essential (primary) hypertension: Secondary | ICD-10-CM

## 2024-11-02 DIAGNOSIS — M4802 Spinal stenosis, cervical region: Secondary | ICD-10-CM

## 2024-11-02 DIAGNOSIS — E785 Hyperlipidemia, unspecified: Secondary | ICD-10-CM

## 2024-11-02 NOTE — Consult Note (Signed)
 "      Chief Complaint: Patient was seen in consultation today for T12 fracture, back pain at the request of Kalisetti,Radhika  Referring Physician(s): Kalisetti,Radhika  History of Present Illness: Suzanne Escobar is a 79 y.o. female with a known history of osteoporosis and prior insufficiency fracture at L1 which was treated by kyphoplasty about 10 years ago.  She was doing well and in her usual state of health until about 3 weeks ago when she was making the bed and pulling on the sheet and felt a pop in her back and sudden onset of severe pain.   She has undergone 3 full weeks of conservative management with TLSO bracing and narcotic pain control (percocet) without relief.  He pain oscillates from a low of 5 to a maximum of 9 or 10 out of 10.  Her symptoms are disabling as well, she scored an 11 on the [employer] disability questionnaire.  She is fatigued from being unable to sleep well at night and strongly desires treatment and relief.   Past Medical History:  Diagnosis Date   Anemia    Ankle fracture, left    Arthritis    Complication of anesthesia    a.) delayed emergence with routine colonoscopy; I was given too much medicine. b.) PONV. c.) postoperative delirium; hallucinations.   Coronary artery calcification seen on CT scan    a.) Coronary CTA 11/03/2011: calcium score 0. b.) LHC 01/03/2013: normal coronary anatomy. c.) CTA 04/04/2021: mild coronary artery calcifications.   Dysrhythmia    Endometriosis    Fibromyalgia    GERD (gastroesophageal reflux disease)    Hearing impaired    Hiatal hernia    History of hiatal hernia    HLD (hyperlipidemia)    HTN (hypertension)    IBS (irritable bowel syndrome)    Mitral valve prolapse    Palpitations    Personal history of radiation therapy    PONV (postoperative nausea and vomiting)    Pulmonary HTN (HCC) 08/21/2020   a.) TTE 08/21/2020: mild elevated PASP of 38 mmHg.   Skin cancer    SOB (shortness of breath)      Past Surgical History:  Procedure Laterality Date   ABDOMINAL HYSTERECTOMY     ANTERIOR CERVICAL DECOMP/DISCECTOMY FUSION N/A 12/03/2021   Procedure: C6-7 ANTERIOR CERVICAL DISCECTOMY AND FUSION (GLOBUS HEDRON);  Surgeon: Clois Fret, MD;  Location: [facility];  Service: Neurosurgery;  Laterality: N/A;   APPENDECTOMY     BACK SURGERY     cervical fusion and at tailbone   BLEPHAROPLASTY Bilateral    BREAST BIOPSY Left 05/10/2024   Stereo bx, X clip, path pending   BREAST BIOPSY Left 05/10/2024   MM LT BREAST BX W LOC DEV 1ST LESION IMAGE BX SPEC STEREO GUIDE 05/10/2024 [facility]   BREAST LUMPECTOMY WITH NEEDLE LOCALIZATION N/A 07/03/2022   Procedure: BREAST LUMPECTOMY WITH NEEDLE LOCALIZATION;  Surgeon: Dessa Reyes ORN, MD;  Location: [facility];  Service: General;  Laterality: N/A;   CARDIAC CATHETERIZATION     X 3   CATARACT EXTRACTION, BILATERAL     CHOLECYSTECTOMY     COLONOSCOPY W/ POLYPECTOMY     COLONOSCOPY WITH ESOPHAGOGASTRODUODENOSCOPY (EGD)     COLONOSCOPY WITH PROPOFOL  N/A 08/30/2023   Procedure: COLONOSCOPY WITH PROPOFOL ;  Surgeon: Maryruth Ole DASEN, MD;  Location: [facility];  Service: Endoscopy;  Laterality: N/A;   FOOT SURGERY Bilateral    IR RADIOLOGIST EVAL & MGMT  11/02/2024   KYPHOPLASTY     LAPAROSCOPY  OOPHERECTOMY   LEFT HEART CATH AND CORONARY ANGIOGRAPHY Left 01/03/2013   Procedure: LEFT HEART CATH AND CORONARY ANGIOGRAPHY; Location: [facility]; Surgeon: Oneil Hibbs, MD   ORIF WRIST FRACTURE Left 11/18/2020   Procedure: OPEN REDUCTION INTERNAL FIXATION OF LEFT DISTAL RADIUS FRACTURE;  Surgeon: Mardee Lynwood SQUIBB, MD;  Location: [facility];  Service: Orthopedics;  Laterality: Left;   POLYPECTOMY  08/30/2023   Procedure: POLYPECTOMY;  Surgeon: Maryruth Ole DASEN, MD;  Location: [facility];  Service: Endoscopy;;   SEPTOPLASTY     SHOULDER ARTHROSCOPY DISTAL CLAVICLE EXCISION AND OPEN ROTATOR CUFF REPAIR Bilateral    X2   TOTAL HIP  ARTHROPLASTY Bilateral     Allergies: Hydrocodone-acetaminophen   Medications: Prior to Admission medications  Medication Sig Start Date End Date Taking? Authorizing Provider  acetaminophen  (TYLENOL ) 500 MG tablet Take 1,000 mg by mouth every 6 (six) hours as needed.   Yes [provider]  cyanocobalamin (VITAMIN B12) 1000 MCG tablet Take 1,000 mcg by mouth daily.   Yes [provider]  magnesium oxide (MAG-OX) 400 MG tablet Take 400 mg by mouth daily.   Yes [provider]  metoprolol  succinate (TOPROL -XL) 25 MG 24 hr tablet Take 25 mg by mouth every evening. 08/24/20  Yes [provider]  oxyCODONE -acetaminophen  (PERCOCET/ROXICET) 5-325 MG tablet Take 1 tablet by mouth every 4 (four) hours as needed for severe pain (pain score 7-10).   Yes [provider]  pantoprazole (PROTONIX) 40 MG tablet Take 40 mg by mouth daily.   Yes [provider]  RESTASIS 0.05 % ophthalmic emulsion Place 1 drop into both eyes 2 (two) times daily. 10/23/20  Yes [provider]  rosuvastatin (CRESTOR) 5 MG tablet Take 5 mg by mouth daily. 07/05/20  Yes [provider]  tirzepatide (ZEPBOUND) 2.5 MG/0.5ML injection vial Inject 2.5 mg into the skin once a week. 08/03/24 08/03/25 Yes [provider]  Vitamin D-Vitamin K (K2 PLUS D3 PO) Take 1 tablet by mouth daily.   Yes [provider]  amitriptyline (ELAVIL) 50 MG tablet Take 50 mg by mouth at bedtime. Patient not taking: Reported on 11/02/2024 03/24/23   [provider]  furosemide (LASIX) 20 MG tablet Take 20 mg by mouth daily as needed for edema. Patient not taking: Reported on 08/30/2023 10/03/21   [provider]  letrozole  (FEMARA ) 2.5 MG tablet TAKE 1 TABLET(2.5 MG) BY MOUTH DAILY Patient not taking: Reported on 11/02/2024 02/22/24   Rao, Archana C, MD  nitrofurantoin, macrocrystal-monohydrate, (MACROBID) 100 MG capsule Take 100 mg by mouth 2 (two) times  daily. Patient not taking: Reported on 11/02/2024 01/19/24   [provider]  Semaglutide-Weight Management 0.25 MG/0.5ML SOAJ Inject 0.25 mg into the skin once a week. Patient not taking: Reported on 08/09/2024 04/21/23   [provider]  trimethoprim  (TRIMPEX ) 100 MG tablet Take 1 tablet (100 mg total) by mouth daily. Patient not taking: Reported on 08/09/2024 05/03/23   Gaston Hamilton, MD     Family History  Problem Relation Age of Onset   Heart attack Father    Heart disease Father    Breast cancer Sister        d. 12s; metastatic   Breast cancer Sister        dx 33   Heart attack Brother    Lymphoma Brother    Breast cancer Maternal Aunt        dx twice   Throat cancer Paternal Aunt    Cervical cancer Paternal  Aunt    Breast cancer Cousin        maternal cousin   Cervical cancer Niece     Social History   Socioeconomic History   Marital status: Widowed    Spouse name: Not on file   Number of children: Not on file   Years of education: Not on file   Highest education level: Not on file  Occupational History   Not on file  Tobacco Use   Smoking status: Never    Passive exposure: Never   Smokeless tobacco: Never  Vaping Use   Vaping status: Never Used  Substance and Sexual Activity   Alcohol use: Not Currently    Comment: occasional wine   Drug use: Never   Sexual activity: Not on file  Other Topics Concern   Not on file  Social History Narrative   Not on file   Social Drivers of Health   Tobacco Use: Low Risk  (10/31/2024)   Received from [facility]   Patient History    Smoking Tobacco Use: Never    Smokeless Tobacco Use: Never    Passive Exposure: Not on file  Financial Resource Strain: Low Risk  (10/31/2024)   Received from [facility]   Overall Financial Resource Strain (CARDIA)    Difficulty of Paying Living Expenses: Not hard at all  Food Insecurity: No Food Insecurity (10/31/2024)   Received from  [facility]   Epic    Within the past 12 months, you worried that your food would run out before you got the money to buy more.: Never true    Within the past 12 months, the food you bought just didn't last and you didn't have money to get more.: Never true  Transportation Needs: No Transportation Needs (10/31/2024)   Received from [facility] - Transportation    In the past 12 months, has lack of transportation kept you from medical appointments or from getting medications?: No    Lack of Transportation (Non-Medical): No  Physical Activity: Not on file  Stress: Not on file  Social Connections: Not on file  Depression (PHQ2-9): Low Risk (07/26/2024)   Depression (PHQ2-9)    PHQ-2 Score: 0  Alcohol Screen: Not on file  Housing: Low Risk  (10/31/2024)   Received from [facility]   Epic    In the last 12 months, was there a time when you were not able to pay the mortgage or rent on time?: No    In the past 12 months, how many times have you moved where you were living?: 0    At any time in the past 12 months, were you homeless or living in a shelter (including now)?: No  Utilities: Not At Risk (10/31/2024)   Received from [facility]   Epic    In the past 12 months has the electric, gas, oil, or water company threatened to shut off services in your home?: No  Health Literacy: Not on file    Review of Systems: A 12 point ROS discussed and pertinent positives are indicated in the HPI above.  All other systems are negative.  Review of Systems  Vital Signs: BP (!) 124/96 (BP Location: Left Arm, Patient Position: Sitting, Cuff Size: Normal)   Pulse 76   Temp 97.9 F (36.6 C) (Oral)   Resp 16   Wt 79.8 kg   SpO2 97%   BMI 29.29  kg/m   Physical Exam Constitutional:      General: She is not in acute distress.    Appearance: Normal appearance.  HENT:     Head: Normocephalic and atraumatic.  Eyes:      General: No scleral icterus. Cardiovascular:     Rate and Rhythm: Normal rate.  Pulmonary:     Effort: Pulmonary effort is normal.  Abdominal:     Tenderness: There is no abdominal tenderness. There is no guarding.  Musculoskeletal:     Comments: TTP around the T12 region  Skin:    General: Skin is warm and dry.  Neurological:     Mental Status: She is alert and oriented to person, place, and time.  Psychiatric:        Mood and Affect: Mood normal.        Behavior: Behavior normal.       Imaging: IR Radiologist Eval & Mgmt Result Date: 11/02/2024 EXAM: NEW PATIENT OFFICE VISIT CHIEF COMPLAINT: SEE EPIC NOTE HISTORY OF PRESENT ILLNESS: SEE EPIC NOTE REVIEW OF SYSTEMS: SEE EPIC NOTE PHYSICAL EXAMINATION: SEE EPIC NOTE ASSESSMENT AND PLAN: SEE EPIC NOTE Electronically Signed   By: Wilkie Lent M.D.   On: 11/02/2024 10:40   MR LUMBAR SPINE WO CONTRAST Result Date: 10/17/2024 MR LUMBAR SPINE WITHOUT CONTRAST HISTORY: Lower back pain TECHNIQUE: Multiplanar, multi-sequence imaging of the lumbar spine was performed without contrast. COMPARISON: October 04, 2023, January 24, 2024 FINDINGS: Vertebral augmentation changes are seen at L1. These are stable. There is a mild anterior compression fracture at T12 with bone marrow edema along the superior endplate and linear signal abnormality. This is new since the prior study. No retropulsion of bony fragments into the canal is seen. There is 3 mm retrolisthesis at L5-S1. There is moderate disc height loss at L1-2. There is partial disc desiccation throughout the lumbar spine. Conus terminates normally at L1-2. There is mild subcutaneous edema within the posterior soft tissues of the lower back. T12-L1: Shallow posterior disc bulge without significant canal or foraminal stenosis. Mild bilateral facet arthropathy. This is stable. L1-2: Circumferential disc bulge with right paracentral disc protrusion and increased T2/STIR signal within the disc protrusion,  stable since the prior study. Moderate to severe overall central canal stenosis and moderate bilateral neuroforaminal stenosis. L2-3: Circumferential disc bulge asymmetric to the left with ligamentum flavum hypertrophy causes moderate central canal stenosis and mild bilateral neuroforaminal stenosis. This is stable. Mild bilateral facet arthropathy. L3-4: Grade 1 anterolisthesis with circumferential disc bulge and ligamentum flavum hypertrophy causes severe central canal stenosis, stable. Mild bilateral neuroforaminal stenosis, stable severe bilateral facet arthropathy. L4-5: Circumferential disc bulge with ligamentum flavum hypertrophy causes moderate central canal stenosis and mild bilateral neuroforaminal stenosis, stable. Moderate bilateral facet arthropathy. L5-S1: Circumferential disc bulge causes moderate right neuroforaminal stenosis and mild left neuroforaminal stenosis, stable. No significant canal stenosis. IMPRESSION: 1. Acute to subacute, mild anterior compression fracture at T12, new since the prior study. 2.  Stable vertebral augmentation changes at L1. 3. 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 Electronically signed by: Venetia Neer MD 10/17/2024 05:28 PM EST RP Workstation: [id number]    Labs:  CBC: No results for input(s): WBC, HGB, HCT, PLT in the last 8760 hours.  COAGS: No results for input(s): INR, APTT in the last 8760 hours.  BMP: No results for input(s): NA, K, CL, CO2, GLUCOSE, BUN, CALCIUM, CREATININE, GFRNONAA, GFRAA in the last 8760 hours.  Invalid input(s): CMP  LIVER FUNCTION TESTS: No results for input(s): BILITOT, AST, ALT, ALKPHOS, PROT, ALBUMIN in the last 8760 hours.  TUMOR MARKERS: No results for input(s): AFPTM, CEA, CA199, CHROMGRNA in the last 8760 hours.  Assessment & Plan:   Patient has suffered subacute osteoporotic fracture of the T12  vertebra.   History and exam have demonstrated the following:  Acute/Subacute fracture by imaging dated 10/17/24, Pain on exam concordant with level of fracture, Failure of conservative therapy and pain refractory to narcotic pain mediation, and Significant disability on the [employer] Disability Questionnaire with 11/24 positive symptoms, reflecting significant impact/impairment of (ADLs)   ICD-10-CM Codes that Support Medical Necessity ([email or web address])  M80.08XA    Age-related osteoporosis with current pathological fracture, vertebra(e), initial encounter for fracture   Plan:  T12 vertebral body augmentation with balloon kyphoplasty  Post-procedure disposition: outpatient   Medication holds: None  The patient has suffered a fracture of the T12 vertebral body. It is recommended that patients aged 56 years or older be evaluated for possible testing or treatment of osteoporosis. A copy of this consult report is sent to the patient's referring physician.  Advanced Care Plan: The patient did not want to provide an Advanced Care Plan at the time of this visit     Total time spent on today's visit was over  40 Minutes , including both face-to-face time and non face-to-face time, personally spent on review of chart (including labs and relevant imaging), discussing further workup and treatment options, referral to specialist if needed, reviewing outside records if pertinent, answering patient questions, and coordinating care regarding [email or web address]    Age-related osteoporosis with current pathological fracture, vertebra(e), initial encounter for fracture as well as management strategy.    Electronically Signed: Wilkie MARLA Lent 11/02/2024, 11:54 AM      "

## 2024-11-03 ENCOUNTER — Other Ambulatory Visit: Payer: Self-pay | Admitting: Internal Medicine

## 2024-11-03 DIAGNOSIS — M8008XA Age-related osteoporosis with current pathological fracture, vertebra(e), initial encounter for fracture: Secondary | ICD-10-CM

## 2024-11-03 LAB — CBC WITH DIFFERENTIAL/PLATELET
Absolute Lymphocytes: 1730 {cells}/uL (ref 850–3900)
Absolute Monocytes: 510 {cells}/uL (ref 200–950)
Basophils Absolute: 50 {cells}/uL (ref 0–200)
Basophils Relative: 0.9 %
Eosinophils Absolute: 90 {cells}/uL (ref 15–500)
Eosinophils Relative: 1.6 %
HCT: 39.5 % (ref 35.9–46.0)
Hemoglobin: 13.2 g/dL (ref 11.7–15.5)
MCH: 30.9 pg (ref 27.0–33.0)
MCHC: 33.4 g/dL (ref 31.6–35.4)
MCV: 92.5 fL (ref 81.4–101.7)
MPV: 11.4 fL (ref 7.5–12.5)
Monocytes Relative: 9.1 %
Neutro Abs: 3220 {cells}/uL (ref 1500–7800)
Neutrophils Relative %: 57.5 %
Platelets: 230 10*3/uL (ref 140–400)
RBC: 4.27 Million/uL (ref 3.80–5.10)
RDW: 12.3 % (ref 11.0–15.0)
Total Lymphocyte: 30.9 %
WBC: 5.6 10*3/uL (ref 3.8–10.8)

## 2024-11-03 LAB — COMPLETE METABOLIC PANEL WITHOUT GFR
AG Ratio: 1.5 (calc) (ref 1.0–2.5)
ALT: 14 U/L (ref 6–29)
AST: 20 U/L (ref 10–35)
Albumin: 3.9 g/dL (ref 3.6–5.1)
Alkaline phosphatase (APISO): 90 U/L (ref 37–153)
BUN/Creatinine Ratio: 26 (calc) — ABNORMAL HIGH (ref 6–22)
BUN: 15 mg/dL (ref 7–25)
CO2: 26 mmol/L (ref 20–32)
Calcium: 8.7 mg/dL (ref 8.6–10.4)
Chloride: 107 mmol/L (ref 98–110)
Creat: 0.57 mg/dL — ABNORMAL LOW (ref 0.60–1.00)
Globulin: 2.6 g/dL (ref 1.9–3.7)
Glucose, Bld: 103 mg/dL — ABNORMAL HIGH (ref 65–99)
Potassium: 4.6 mmol/L (ref 3.5–5.3)
Sodium: 140 mmol/L (ref 135–146)
Total Bilirubin: 0.3 mg/dL (ref 0.2–1.2)
Total Protein: 6.5 g/dL (ref 6.1–8.1)

## 2024-11-03 LAB — HOUSE ACCOUNT TRACKING

## 2024-11-07 ENCOUNTER — Other Ambulatory Visit

## 2024-11-28 ENCOUNTER — Other Ambulatory Visit

## 2024-11-28 ENCOUNTER — Ambulatory Visit: Admitting: Orthopedic Surgery

## 2025-01-15 ENCOUNTER — Inpatient Hospital Stay: Admitting: Oncology

## 2025-01-22 ENCOUNTER — Inpatient Hospital Stay: Admitting: Oncology
